# Patient Record
Sex: Female | Born: 1985
Health system: Southern US, Community
[De-identification: ages and names within clinical notes are randomized; demographics above are authoritative.]

## PROBLEM LIST (undated history)

## (undated) DIAGNOSIS — E119 Type 2 diabetes mellitus without complications: Secondary | ICD-10-CM

---
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
## 2004-03-28 ENCOUNTER — Ambulatory Visit (HOSPITAL_COMMUNITY): Admission: RE | Admit: 2004-03-28 | Discharge: 2004-03-28 | Payer: Self-pay | Admitting: *Deleted

## 2004-05-31 ENCOUNTER — Inpatient Hospital Stay (HOSPITAL_COMMUNITY): Admission: AD | Admit: 2004-05-31 | Discharge: 2004-05-31 | Payer: Self-pay | Admitting: *Deleted

## 2004-07-18 ENCOUNTER — Inpatient Hospital Stay (HOSPITAL_COMMUNITY): Admission: AD | Admit: 2004-07-18 | Discharge: 2004-07-18 | Payer: Self-pay | Admitting: *Deleted

## 2004-07-27 ENCOUNTER — Ambulatory Visit: Payer: Self-pay | Admitting: *Deleted

## 2004-08-01 ENCOUNTER — Encounter: Admission: RE | Admit: 2004-08-01 | Discharge: 2004-08-01 | Payer: Self-pay | Admitting: *Deleted

## 2004-08-03 ENCOUNTER — Ambulatory Visit: Payer: Self-pay | Admitting: *Deleted

## 2004-08-10 ENCOUNTER — Ambulatory Visit: Payer: Self-pay | Admitting: Obstetrics & Gynecology

## 2004-08-17 ENCOUNTER — Ambulatory Visit (HOSPITAL_COMMUNITY): Admission: RE | Admit: 2004-08-17 | Discharge: 2004-08-17 | Payer: Self-pay | Admitting: *Deleted

## 2004-08-17 ENCOUNTER — Ambulatory Visit: Payer: Self-pay | Admitting: Obstetrics & Gynecology

## 2004-08-25 ENCOUNTER — Ambulatory Visit: Payer: Self-pay | Admitting: Family Medicine

## 2004-09-01 ENCOUNTER — Ambulatory Visit: Payer: Self-pay | Admitting: Family Medicine

## 2004-09-05 ENCOUNTER — Ambulatory Visit: Payer: Self-pay | Admitting: Obstetrics & Gynecology

## 2004-09-07 ENCOUNTER — Ambulatory Visit: Payer: Self-pay | Admitting: Obstetrics & Gynecology

## 2004-09-12 ENCOUNTER — Ambulatory Visit: Payer: Self-pay | Admitting: Obstetrics and Gynecology

## 2004-09-14 ENCOUNTER — Ambulatory Visit (HOSPITAL_COMMUNITY): Admission: RE | Admit: 2004-09-14 | Discharge: 2004-09-14 | Payer: Self-pay | Admitting: *Deleted

## 2004-09-15 ENCOUNTER — Ambulatory Visit: Payer: Self-pay | Admitting: Family Medicine

## 2004-09-19 ENCOUNTER — Ambulatory Visit: Payer: Self-pay | Admitting: Obstetrics and Gynecology

## 2004-09-22 ENCOUNTER — Ambulatory Visit: Payer: Self-pay | Admitting: Family Medicine

## 2004-09-26 ENCOUNTER — Ambulatory Visit: Payer: Self-pay | Admitting: Obstetrics and Gynecology

## 2004-09-29 ENCOUNTER — Ambulatory Visit: Payer: Self-pay | Admitting: Family Medicine

## 2004-09-30 ENCOUNTER — Inpatient Hospital Stay (HOSPITAL_COMMUNITY): Admission: AD | Admit: 2004-09-30 | Discharge: 2004-09-30 | Payer: Self-pay | Admitting: Obstetrics & Gynecology

## 2004-10-03 ENCOUNTER — Inpatient Hospital Stay (HOSPITAL_COMMUNITY): Admission: AD | Admit: 2004-10-03 | Discharge: 2004-10-06 | Payer: Self-pay | Admitting: Obstetrics and Gynecology

## 2004-10-03 ENCOUNTER — Ambulatory Visit: Payer: Self-pay | Admitting: Obstetrics and Gynecology

## 2004-10-03 ENCOUNTER — Encounter (INDEPENDENT_AMBULATORY_CARE_PROVIDER_SITE_OTHER): Payer: Self-pay | Admitting: *Deleted

## 2004-10-07 ENCOUNTER — Inpatient Hospital Stay (HOSPITAL_COMMUNITY): Admission: AD | Admit: 2004-10-07 | Discharge: 2004-10-07 | Payer: Self-pay | Admitting: *Deleted

## 2004-10-28 ENCOUNTER — Ambulatory Visit: Payer: Self-pay | Admitting: Family Medicine

## 2004-11-14 ENCOUNTER — Ambulatory Visit: Payer: Self-pay | Admitting: Sports Medicine

## 2006-01-14 ENCOUNTER — Encounter (INDEPENDENT_AMBULATORY_CARE_PROVIDER_SITE_OTHER): Payer: Self-pay | Admitting: *Deleted

## 2006-01-23 ENCOUNTER — Encounter (INDEPENDENT_AMBULATORY_CARE_PROVIDER_SITE_OTHER): Payer: Self-pay | Admitting: Specialist

## 2006-01-23 ENCOUNTER — Ambulatory Visit: Payer: Self-pay | Admitting: Sports Medicine

## 2006-01-25 ENCOUNTER — Ambulatory Visit: Payer: Self-pay | Admitting: Family Medicine

## 2006-12-13 DIAGNOSIS — E669 Obesity, unspecified: Secondary | ICD-10-CM | POA: Insufficient documentation

## 2006-12-13 DIAGNOSIS — E739 Lactose intolerance, unspecified: Secondary | ICD-10-CM

## 2006-12-14 ENCOUNTER — Encounter (INDEPENDENT_AMBULATORY_CARE_PROVIDER_SITE_OTHER): Payer: Self-pay | Admitting: *Deleted

## 2008-06-03 ENCOUNTER — Ambulatory Visit: Payer: Self-pay | Admitting: Family Medicine

## 2008-06-03 ENCOUNTER — Inpatient Hospital Stay (HOSPITAL_COMMUNITY): Admission: RE | Admit: 2008-06-03 | Discharge: 2008-06-04 | Payer: Self-pay | Admitting: Family Medicine

## 2008-06-03 ENCOUNTER — Ambulatory Visit: Payer: Self-pay | Admitting: Obstetrics & Gynecology

## 2008-06-15 ENCOUNTER — Ambulatory Visit: Payer: Self-pay | Admitting: Obstetrics & Gynecology

## 2008-06-15 ENCOUNTER — Inpatient Hospital Stay (HOSPITAL_COMMUNITY): Admission: AD | Admit: 2008-06-15 | Discharge: 2008-06-17 | Payer: Self-pay | Admitting: Obstetrics & Gynecology

## 2008-06-15 ENCOUNTER — Ambulatory Visit: Payer: Self-pay | Admitting: Family Medicine

## 2008-06-25 ENCOUNTER — Ambulatory Visit: Payer: Self-pay | Admitting: Obstetrics & Gynecology

## 2008-07-06 ENCOUNTER — Ambulatory Visit: Payer: Self-pay | Admitting: Obstetrics & Gynecology

## 2008-07-07 ENCOUNTER — Ambulatory Visit (HOSPITAL_COMMUNITY): Admission: RE | Admit: 2008-07-07 | Discharge: 2008-07-07 | Payer: Self-pay | Admitting: Obstetrics & Gynecology

## 2008-07-18 ENCOUNTER — Emergency Department (HOSPITAL_COMMUNITY): Admission: EM | Admit: 2008-07-18 | Discharge: 2008-07-18 | Payer: Self-pay | Admitting: Emergency Medicine

## 2008-07-20 ENCOUNTER — Ambulatory Visit: Payer: Self-pay | Admitting: Obstetrics & Gynecology

## 2008-08-03 ENCOUNTER — Encounter: Payer: Self-pay | Admitting: Family Medicine

## 2008-08-03 ENCOUNTER — Ambulatory Visit: Payer: Self-pay | Admitting: Family Medicine

## 2008-08-04 ENCOUNTER — Encounter: Payer: Self-pay | Admitting: Obstetrics and Gynecology

## 2008-08-04 ENCOUNTER — Ambulatory Visit (HOSPITAL_COMMUNITY): Admission: RE | Admit: 2008-08-04 | Discharge: 2008-08-04 | Payer: Self-pay | Admitting: Obstetrics & Gynecology

## 2008-08-04 LAB — CONVERTED CEMR LAB
Basophils Absolute: 0 10*3/uL (ref 0.0–0.1)
Basophils Relative: 0 % (ref 0–1)
Eosinophils Absolute: 0 10*3/uL (ref 0.0–0.7)
Hemoglobin: 12.2 g/dL (ref 12.0–15.0)
MCHC: 33 g/dL (ref 30.0–36.0)
MCV: 85.8 fL (ref 78.0–100.0)
Monocytes Absolute: 0.5 10*3/uL (ref 0.1–1.0)
Monocytes Relative: 5 % (ref 3–12)
Neutrophils Relative %: 69 % (ref 43–77)
RBC: 4.31 M/uL (ref 3.87–5.11)
RDW: 14.7 % (ref 11.5–15.5)
Rh Type: POSITIVE
Rubella: 160.3 intl units/mL — ABNORMAL HIGH

## 2008-08-10 ENCOUNTER — Ambulatory Visit: Payer: Self-pay | Admitting: Obstetrics & Gynecology

## 2008-08-11 ENCOUNTER — Encounter: Payer: Self-pay | Admitting: Obstetrics and Gynecology

## 2008-08-17 ENCOUNTER — Ambulatory Visit: Payer: Self-pay | Admitting: Obstetrics & Gynecology

## 2008-08-24 ENCOUNTER — Ambulatory Visit: Payer: Self-pay | Admitting: Obstetrics & Gynecology

## 2008-08-25 ENCOUNTER — Ambulatory Visit (HOSPITAL_COMMUNITY): Admission: RE | Admit: 2008-08-25 | Discharge: 2008-08-25 | Payer: Self-pay | Admitting: Obstetrics & Gynecology

## 2008-08-31 ENCOUNTER — Encounter: Payer: Self-pay | Admitting: Obstetrics and Gynecology

## 2008-08-31 ENCOUNTER — Ambulatory Visit: Payer: Self-pay | Admitting: Obstetrics & Gynecology

## 2008-08-31 ENCOUNTER — Encounter: Admission: RE | Admit: 2008-08-31 | Discharge: 2008-11-29 | Payer: Self-pay | Admitting: Obstetrics & Gynecology

## 2008-09-07 ENCOUNTER — Ambulatory Visit: Payer: Self-pay | Admitting: Obstetrics & Gynecology

## 2008-09-07 ENCOUNTER — Encounter: Payer: Self-pay | Admitting: Obstetrics and Gynecology

## 2008-09-14 ENCOUNTER — Ambulatory Visit: Payer: Self-pay | Admitting: Family Medicine

## 2008-09-21 ENCOUNTER — Ambulatory Visit: Payer: Self-pay | Admitting: Obstetrics & Gynecology

## 2008-09-21 ENCOUNTER — Encounter: Payer: Self-pay | Admitting: Obstetrics and Gynecology

## 2008-09-21 LAB — CONVERTED CEMR LAB: GC Probe Amp, Genital: NEGATIVE

## 2008-09-22 ENCOUNTER — Encounter: Payer: Self-pay | Admitting: Obstetrics and Gynecology

## 2008-09-22 LAB — CONVERTED CEMR LAB
Clue Cells Wet Prep HPF POC: NONE SEEN
Trich, Wet Prep: NONE SEEN

## 2008-09-28 ENCOUNTER — Ambulatory Visit: Payer: Self-pay | Admitting: Family Medicine

## 2008-09-29 ENCOUNTER — Ambulatory Visit (HOSPITAL_COMMUNITY): Admission: RE | Admit: 2008-09-29 | Discharge: 2008-09-29 | Payer: Self-pay | Admitting: Obstetrics & Gynecology

## 2008-10-05 ENCOUNTER — Ambulatory Visit: Payer: Self-pay | Admitting: Obstetrics & Gynecology

## 2008-10-12 ENCOUNTER — Ambulatory Visit: Payer: Self-pay | Admitting: Family Medicine

## 2008-10-19 ENCOUNTER — Ambulatory Visit: Payer: Self-pay | Admitting: Obstetrics & Gynecology

## 2008-10-22 ENCOUNTER — Ambulatory Visit: Payer: Self-pay | Admitting: Family Medicine

## 2008-10-26 ENCOUNTER — Ambulatory Visit (HOSPITAL_COMMUNITY): Admission: RE | Admit: 2008-10-26 | Discharge: 2008-10-26 | Payer: Self-pay | Admitting: Obstetrics & Gynecology

## 2008-10-26 ENCOUNTER — Ambulatory Visit: Payer: Self-pay | Admitting: Family Medicine

## 2008-11-05 ENCOUNTER — Ambulatory Visit (HOSPITAL_COMMUNITY): Admission: RE | Admit: 2008-11-05 | Discharge: 2008-11-05 | Payer: Self-pay | Admitting: Family Medicine

## 2008-11-09 ENCOUNTER — Encounter: Payer: Self-pay | Admitting: Obstetrics and Gynecology

## 2008-11-09 ENCOUNTER — Ambulatory Visit: Payer: Self-pay | Admitting: Obstetrics & Gynecology

## 2008-11-09 LAB — CONVERTED CEMR LAB
HCT: 34.9 % — ABNORMAL LOW (ref 36.0–46.0)
Hemoglobin: 11.5 g/dL — ABNORMAL LOW (ref 12.0–15.0)
RBC: 4.15 M/uL (ref 3.87–5.11)
WBC: 9.2 10*3/uL (ref 4.0–10.5)

## 2008-11-12 ENCOUNTER — Ambulatory Visit (HOSPITAL_COMMUNITY): Admission: RE | Admit: 2008-11-12 | Discharge: 2008-11-12 | Payer: Self-pay | Admitting: Family Medicine

## 2008-11-16 ENCOUNTER — Ambulatory Visit: Payer: Self-pay | Admitting: Family Medicine

## 2008-11-20 ENCOUNTER — Ambulatory Visit (HOSPITAL_COMMUNITY): Admission: RE | Admit: 2008-11-20 | Discharge: 2008-11-20 | Payer: Self-pay | Admitting: Family Medicine

## 2008-11-23 ENCOUNTER — Ambulatory Visit: Payer: Self-pay | Admitting: Obstetrics & Gynecology

## 2008-11-27 ENCOUNTER — Ambulatory Visit (HOSPITAL_COMMUNITY): Admission: RE | Admit: 2008-11-27 | Discharge: 2008-11-27 | Payer: Self-pay | Admitting: Family Medicine

## 2008-11-30 ENCOUNTER — Ambulatory Visit: Payer: Self-pay | Admitting: Obstetrics & Gynecology

## 2008-12-03 ENCOUNTER — Encounter: Payer: Self-pay | Admitting: Obstetrics and Gynecology

## 2008-12-03 ENCOUNTER — Ambulatory Visit: Payer: Self-pay | Admitting: Obstetrics & Gynecology

## 2008-12-03 LAB — CONVERTED CEMR LAB
Trich, Wet Prep: NONE SEEN
Yeast Wet Prep HPF POC: NONE SEEN

## 2008-12-07 ENCOUNTER — Ambulatory Visit: Payer: Self-pay | Admitting: Family Medicine

## 2008-12-08 ENCOUNTER — Encounter: Payer: Self-pay | Admitting: Obstetrics and Gynecology

## 2008-12-10 ENCOUNTER — Ambulatory Visit (HOSPITAL_COMMUNITY): Admission: RE | Admit: 2008-12-10 | Discharge: 2008-12-10 | Payer: Self-pay | Admitting: Family Medicine

## 2008-12-14 ENCOUNTER — Encounter: Admission: RE | Admit: 2008-12-14 | Discharge: 2009-01-11 | Payer: Self-pay | Admitting: Obstetrics & Gynecology

## 2008-12-14 ENCOUNTER — Ambulatory Visit: Payer: Self-pay | Admitting: Obstetrics & Gynecology

## 2008-12-17 ENCOUNTER — Ambulatory Visit (HOSPITAL_COMMUNITY): Admission: RE | Admit: 2008-12-17 | Discharge: 2008-12-17 | Payer: Self-pay | Admitting: Family Medicine

## 2008-12-21 ENCOUNTER — Ambulatory Visit: Payer: Self-pay | Admitting: Obstetrics & Gynecology

## 2008-12-24 ENCOUNTER — Ambulatory Visit (HOSPITAL_COMMUNITY): Admission: RE | Admit: 2008-12-24 | Discharge: 2008-12-24 | Payer: Self-pay | Admitting: Family Medicine

## 2008-12-24 ENCOUNTER — Ambulatory Visit: Payer: Self-pay | Admitting: Family Medicine

## 2008-12-28 ENCOUNTER — Encounter: Payer: Self-pay | Admitting: Obstetrics and Gynecology

## 2008-12-28 ENCOUNTER — Ambulatory Visit: Payer: Self-pay | Admitting: Obstetrics & Gynecology

## 2008-12-28 LAB — CONVERTED CEMR LAB
Chlamydia, DNA Probe: NEGATIVE
GC Probe Amp, Genital: NEGATIVE

## 2008-12-31 ENCOUNTER — Ambulatory Visit: Payer: Self-pay | Admitting: Family Medicine

## 2009-01-04 ENCOUNTER — Ambulatory Visit: Payer: Self-pay | Admitting: Obstetrics & Gynecology

## 2009-01-07 ENCOUNTER — Ambulatory Visit: Payer: Self-pay | Admitting: Obstetrics & Gynecology

## 2009-01-11 ENCOUNTER — Ambulatory Visit: Payer: Self-pay | Admitting: Obstetrics & Gynecology

## 2009-01-12 ENCOUNTER — Inpatient Hospital Stay (HOSPITAL_COMMUNITY): Admission: AD | Admit: 2009-01-12 | Discharge: 2009-01-14 | Payer: Self-pay | Admitting: Obstetrics & Gynecology

## 2009-01-12 ENCOUNTER — Ambulatory Visit: Payer: Self-pay | Admitting: Family Medicine

## 2009-04-03 IMAGING — US US OB LIMITED
1 series · 18 of 19 positions shown · non-contrast
Comparison: none

Addendum Begins11/06/2008 - DUPLICATE COPY for exam association in [facility] ? No change from original report.

 Addendum Ends
 OBSTETRICAL ULTRASOUND:
 This ultrasound was performed in The [HOSPITAL], and the [facility] report will be stored to [REDACTED] PACS.

[Series 1: us ob limited · 19 acquisitions, 18 frames shown]
[im 1/19]
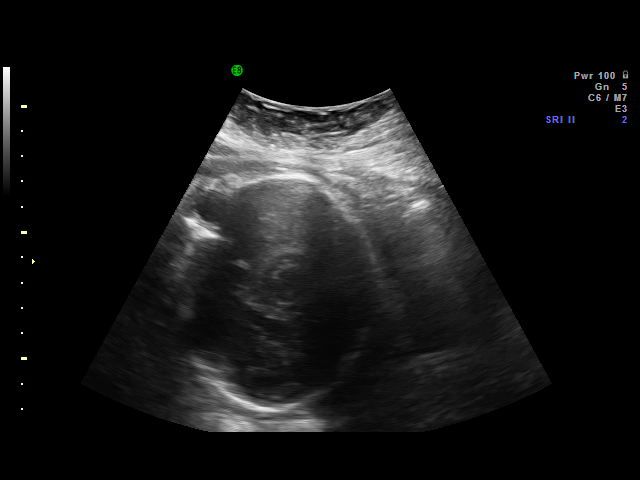
[im 2/19]
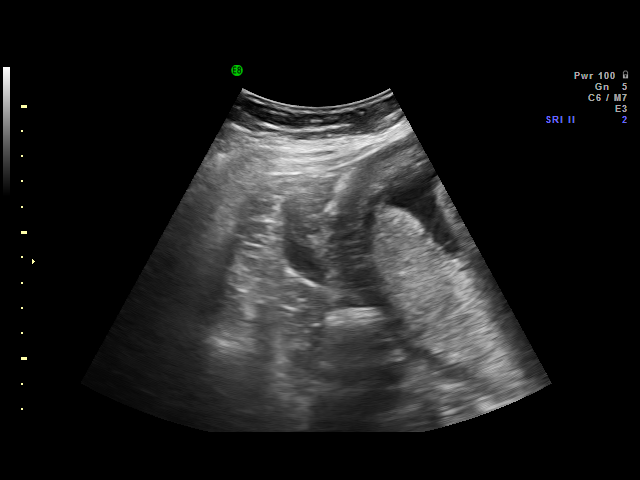
[im 3/19]
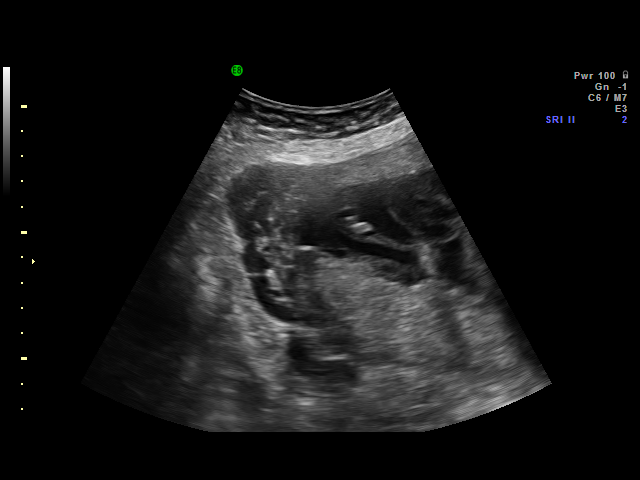
[im 4/19]
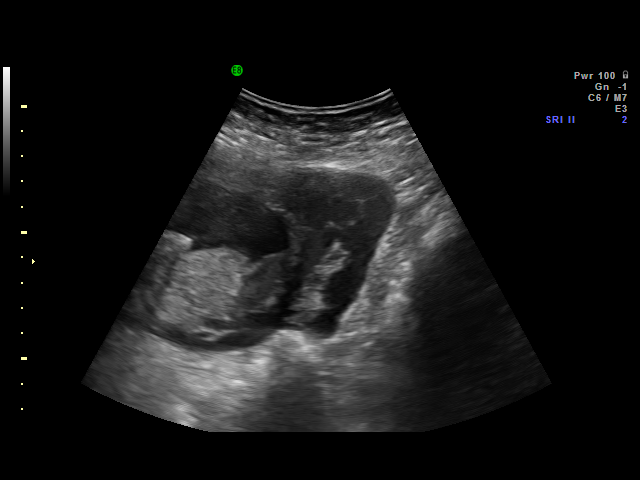
[im 5/19]
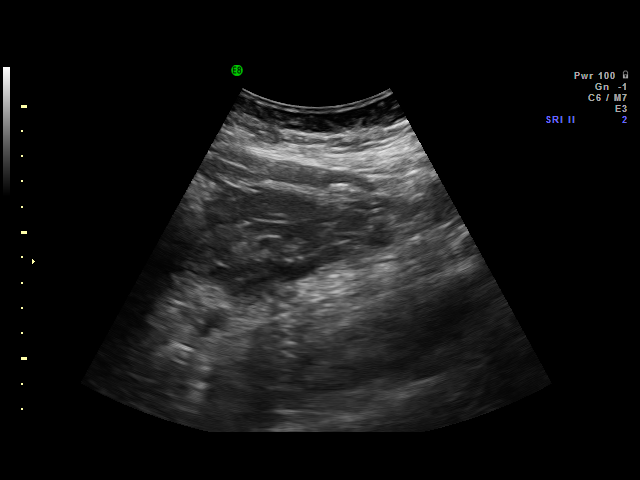
[im 6/19]
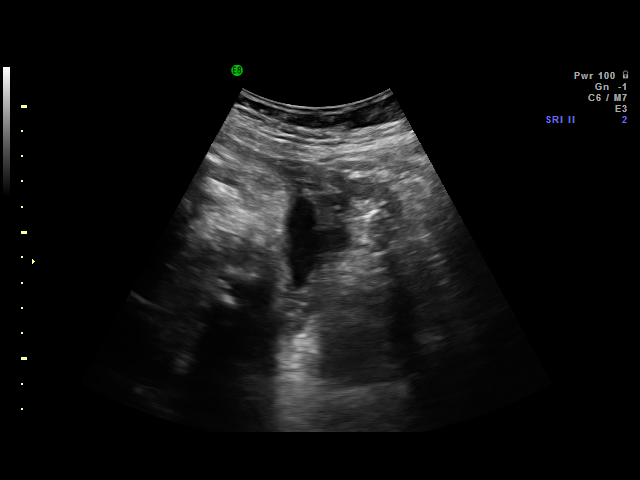
[im 7/19]
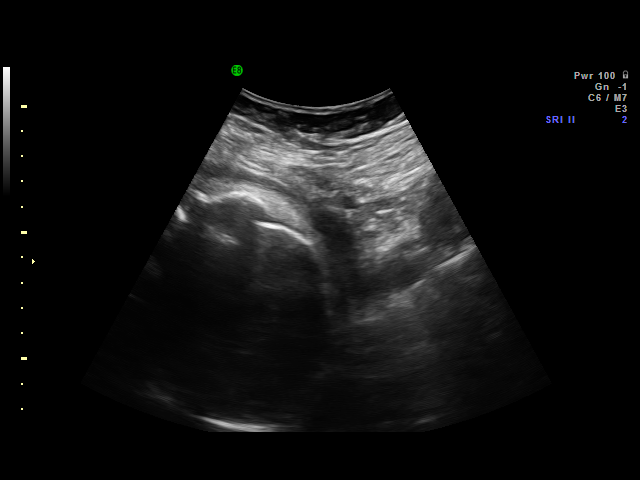
[im 8/19]
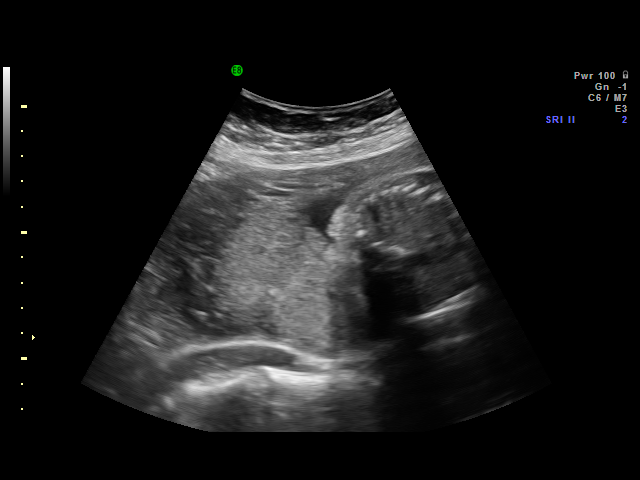
[im 9/19]
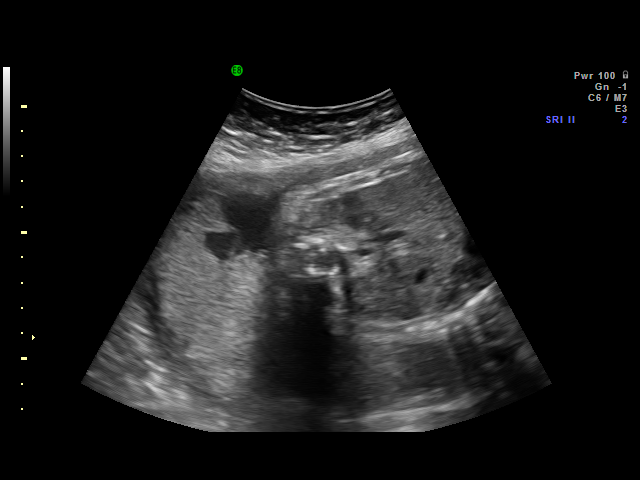
[im 11/19]
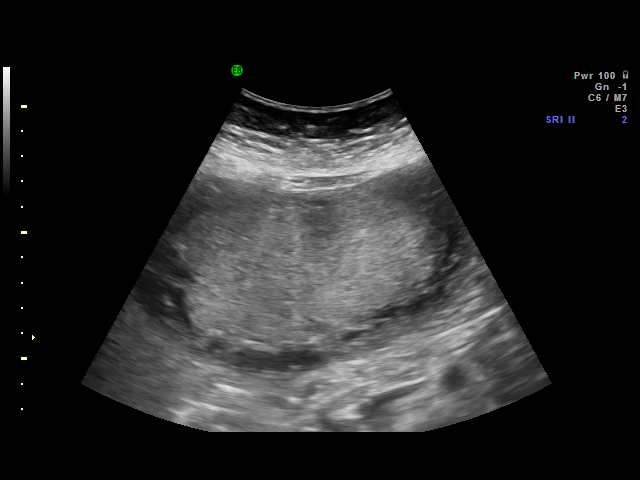
[im 12/19]
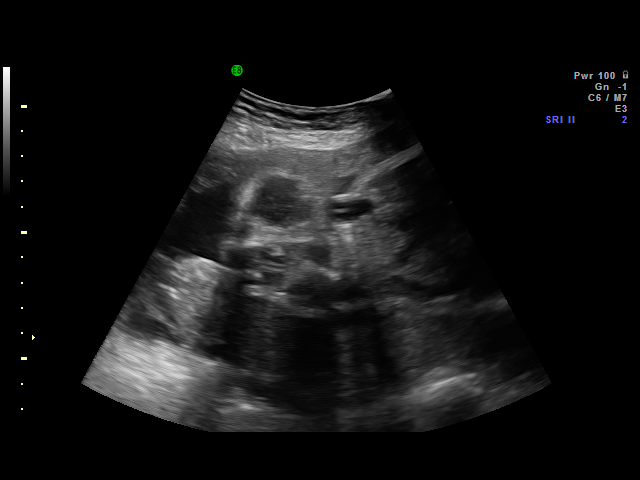
[im 13/19]
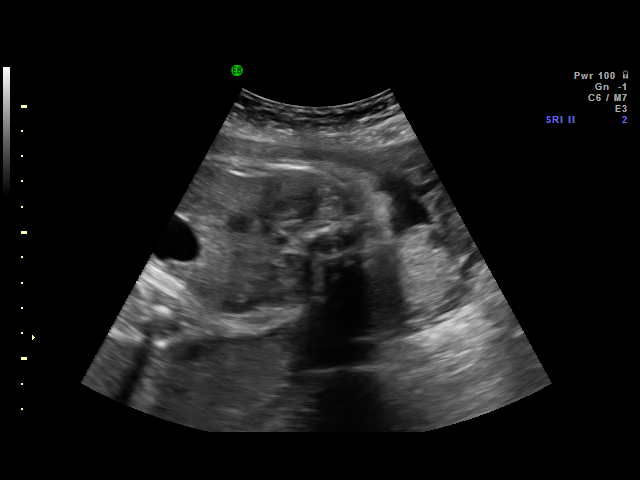
[im 14/19]
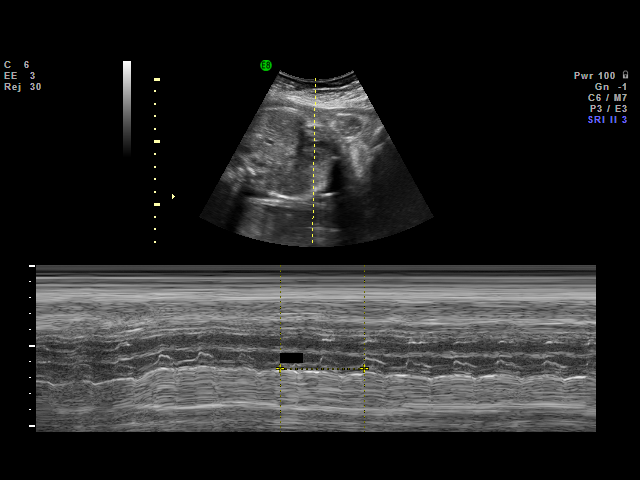
[im 15/19]
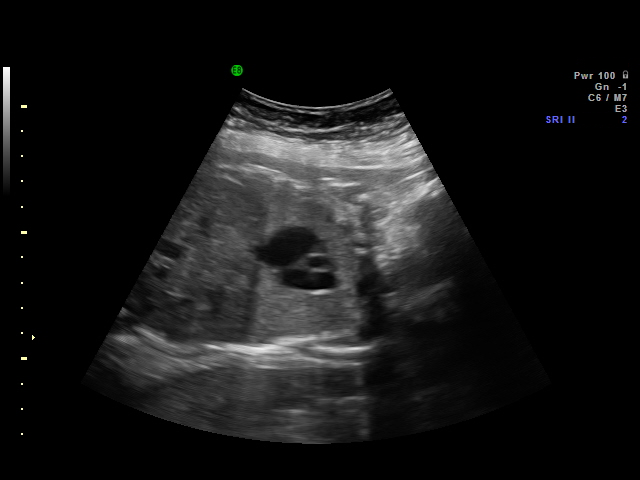
[im 16/19]
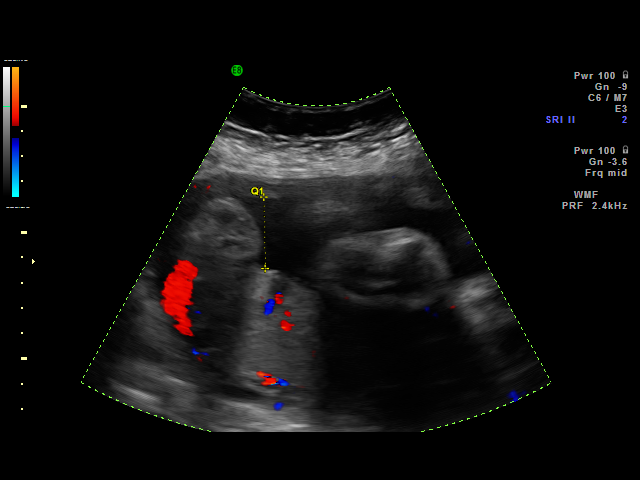
[im 17/19]
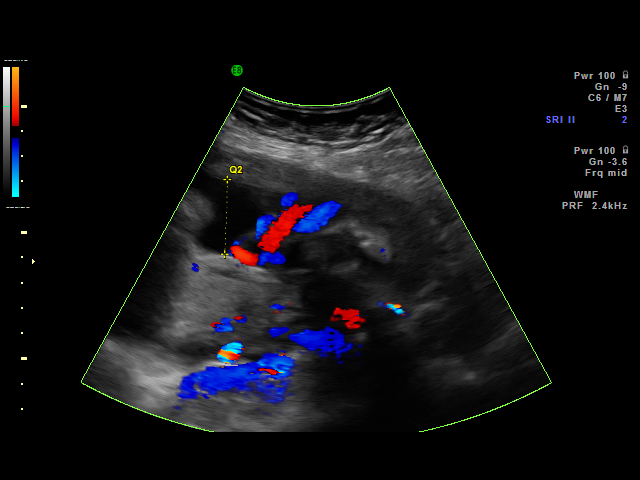
[im 18/19]
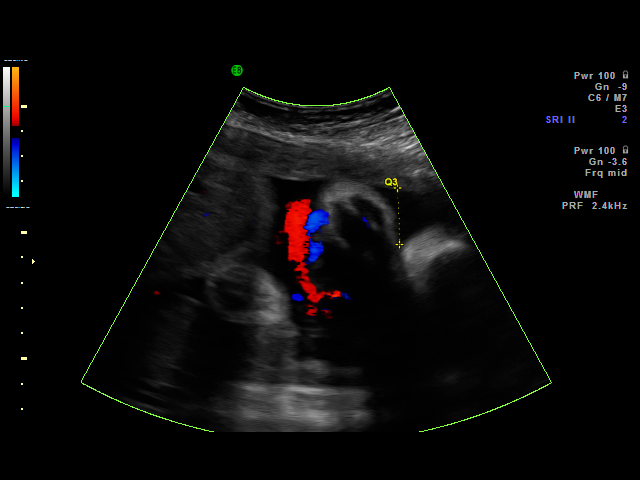
[im 19/19]
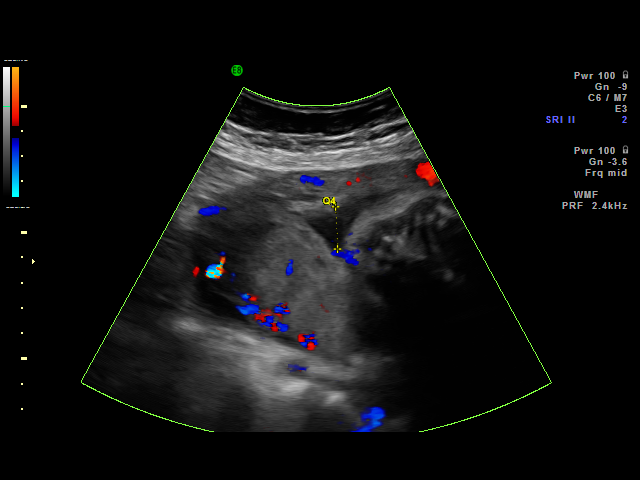

[18 of 19 positions shown; findings below may reference images not displayed]

IMPRESSION: [facility] has also been faxed to the ordering physician.

## 2009-04-25 IMAGING — US US OB LIMITED
1 series · 15 of 15 positions shown · non-contrast
Comparison: none

OBSTETRICAL ULTRASOUND:
 This ultrasound was performed in The [HOSPITAL], and the [facility] report will be stored to [REDACTED] PACS.

[Series 1: us ob limited · 15 acquisitions, 15 frames shown]
[im 1/15]
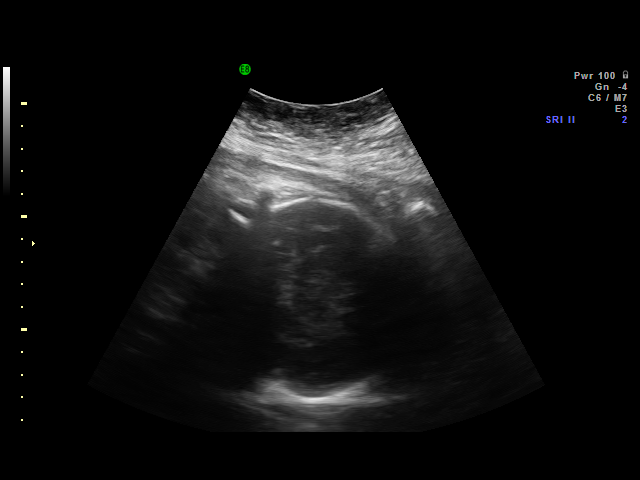
[im 2/15]
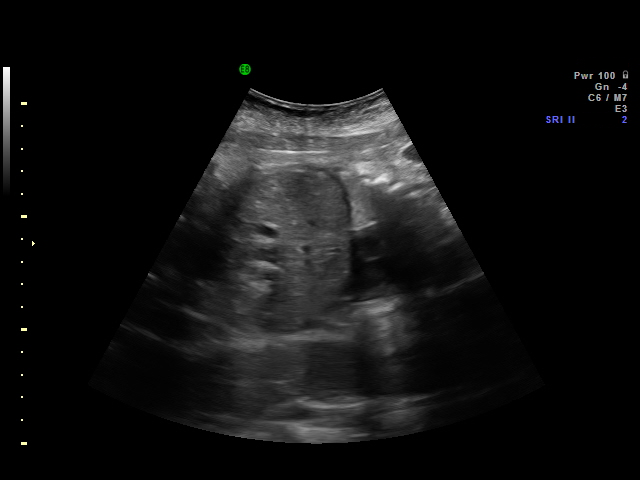
[im 3/15]
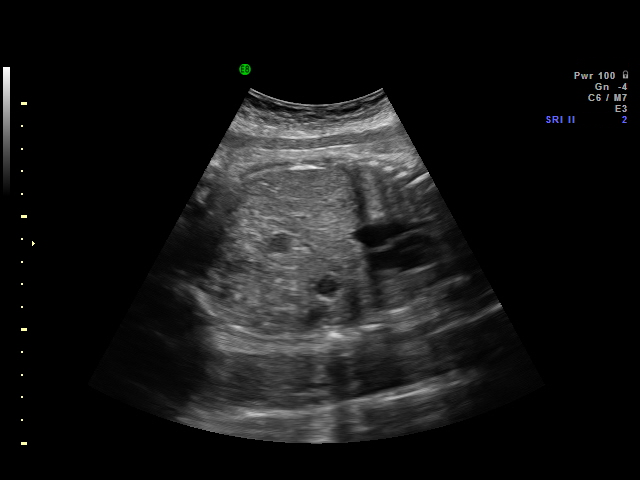
[im 4/15]
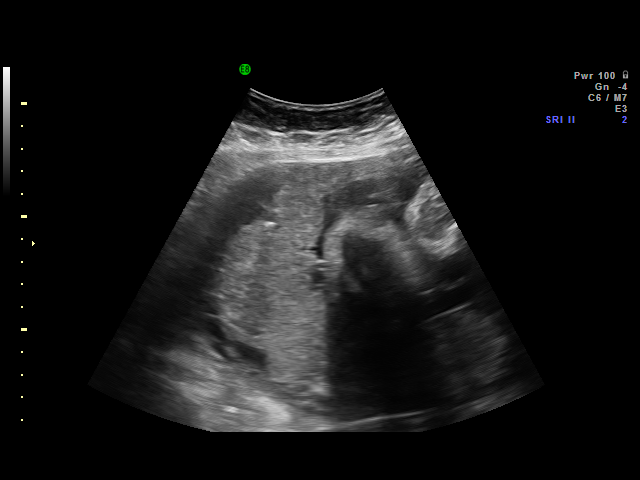
[im 5/15]
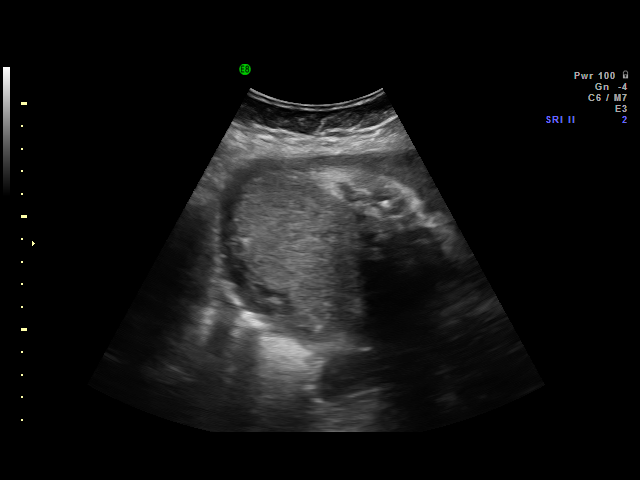
[im 6/15]
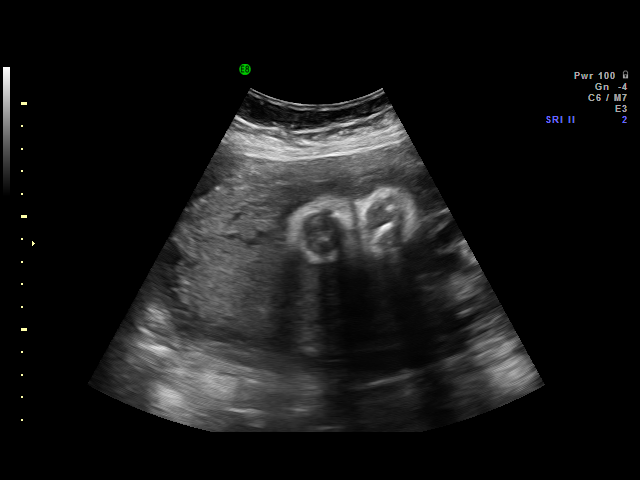
[im 7/15]
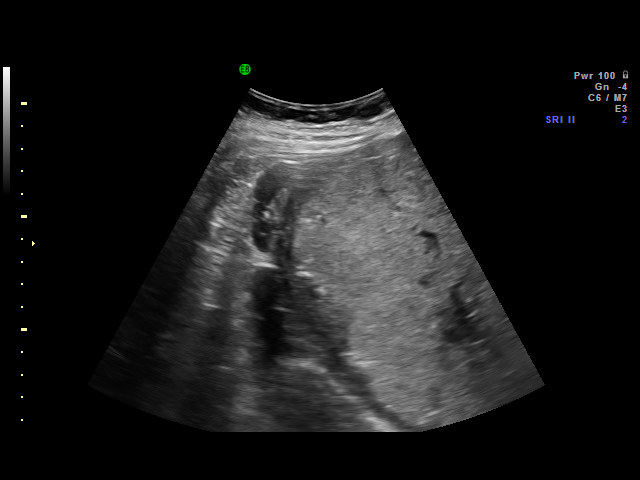
[im 8/15]
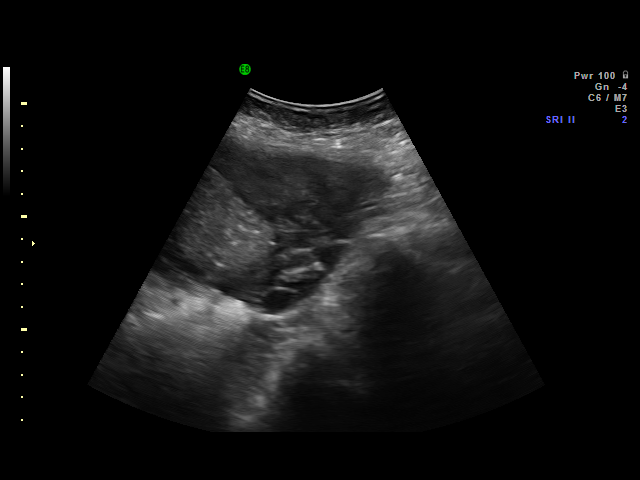
[im 9/15]
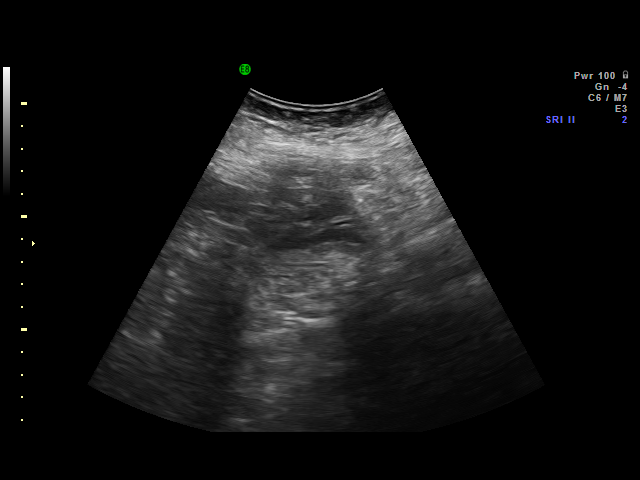
[im 10/15]
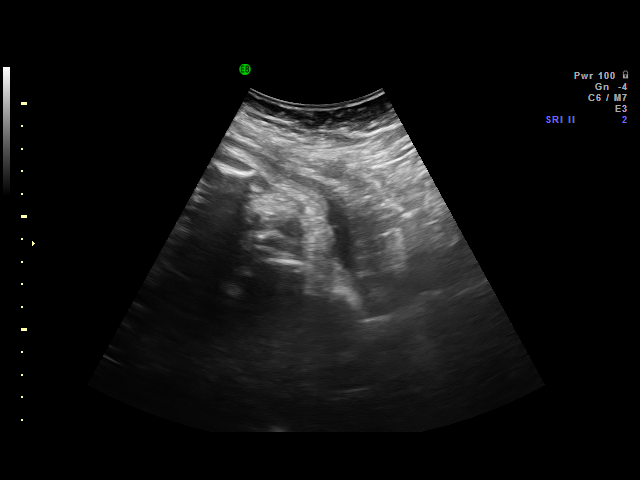
[im 11/15]
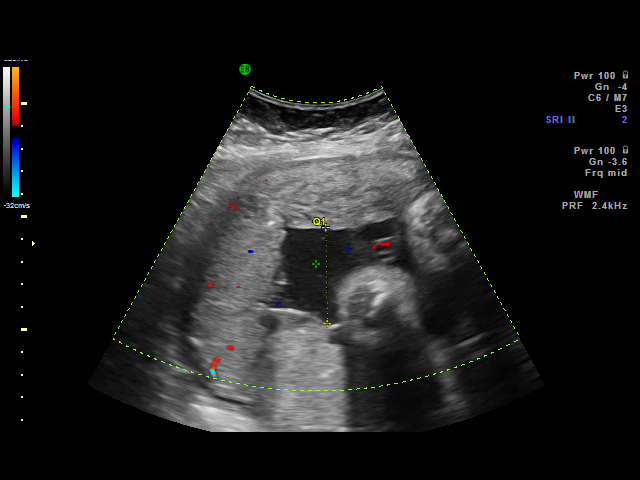
[im 12/15]
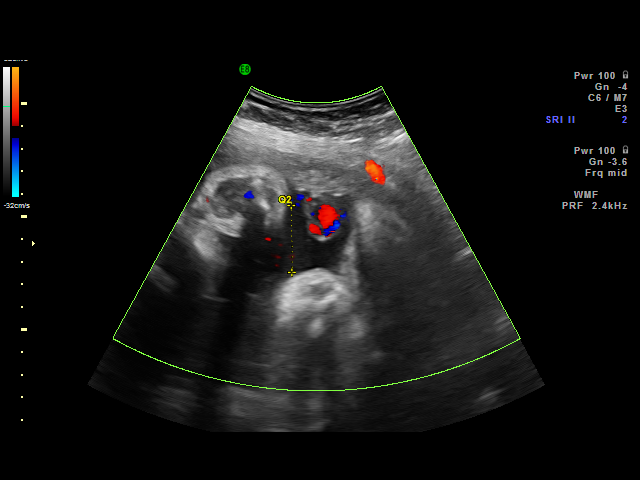
[im 13/15]
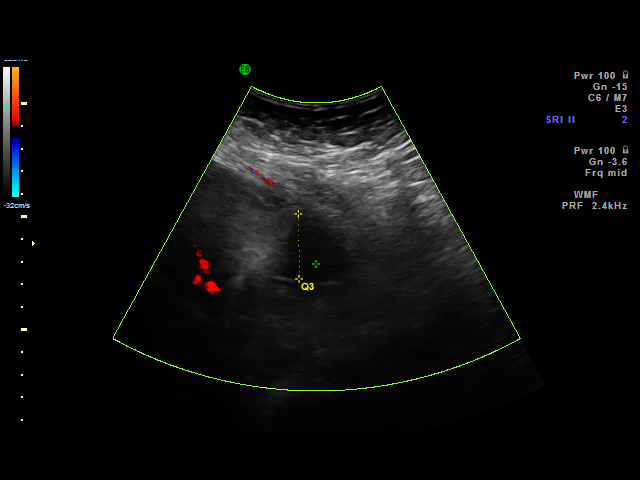
[im 14/15]
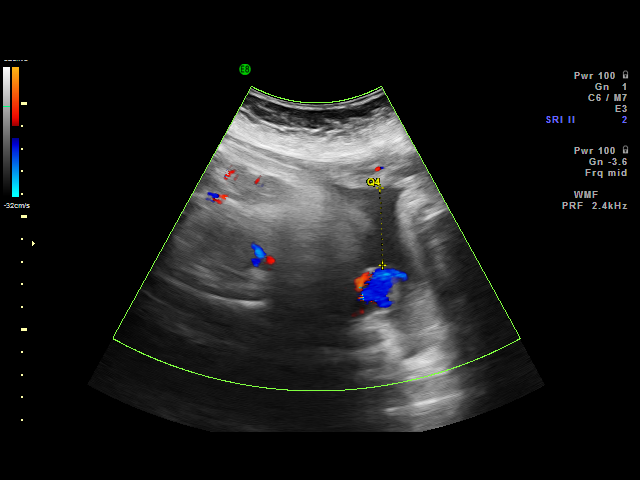
[im 15/15]
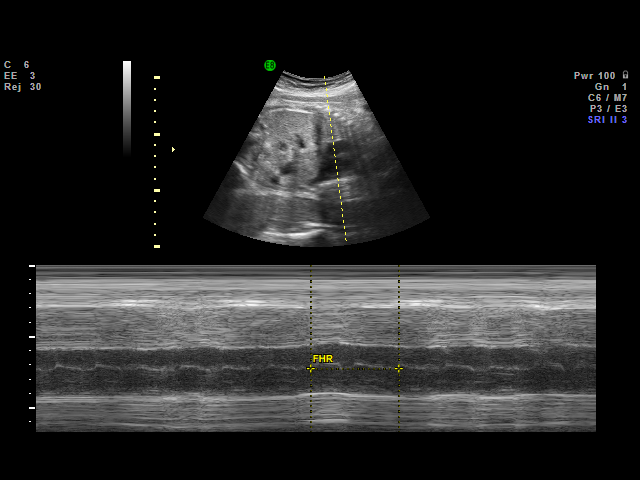

[15 of 15 positions shown; findings below may reference images not displayed]

IMPRESSION: [facility] has also been faxed to the ordering physician.

## 2009-05-15 IMAGING — US US OB LIMITED
1 series · 14 of 19 positions shown · non-contrast
Comparison: none

OBSTETRICAL ULTRASOUND:
 This ultrasound was performed in The [HOSPITAL], and the [facility] report will be stored to [REDACTED] PACS.

[Series 1: us ob limited · 19 acquisitions, 14 frames shown]
[im 1/19]
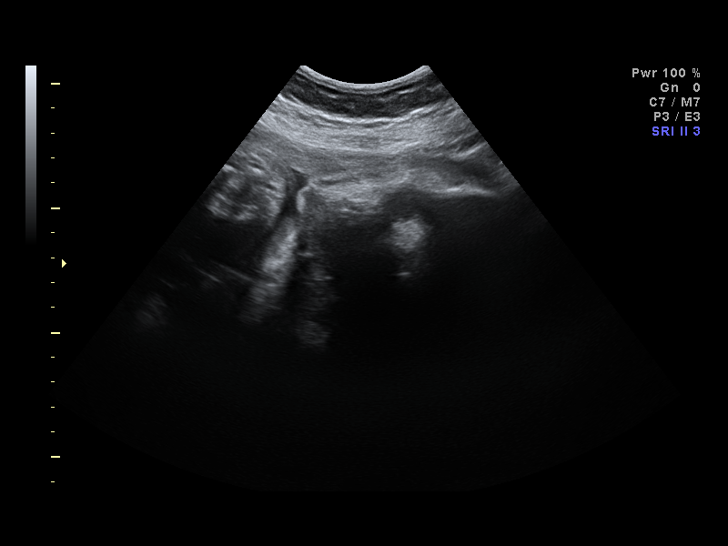
[im 3/19]
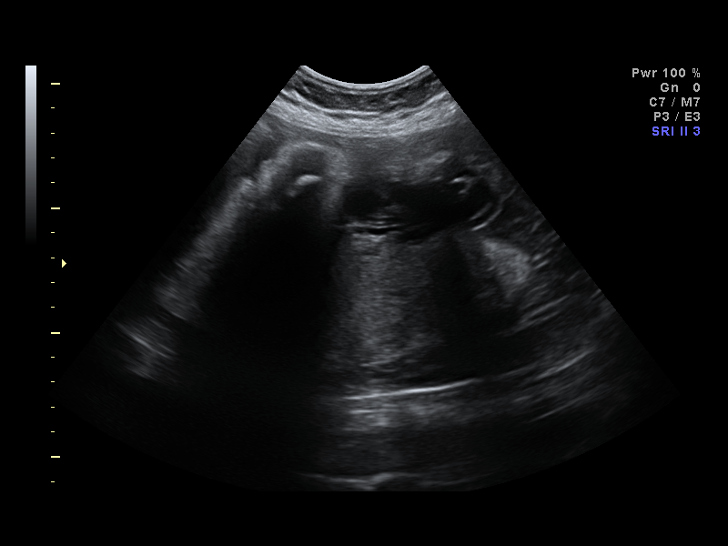
[im 4/19]
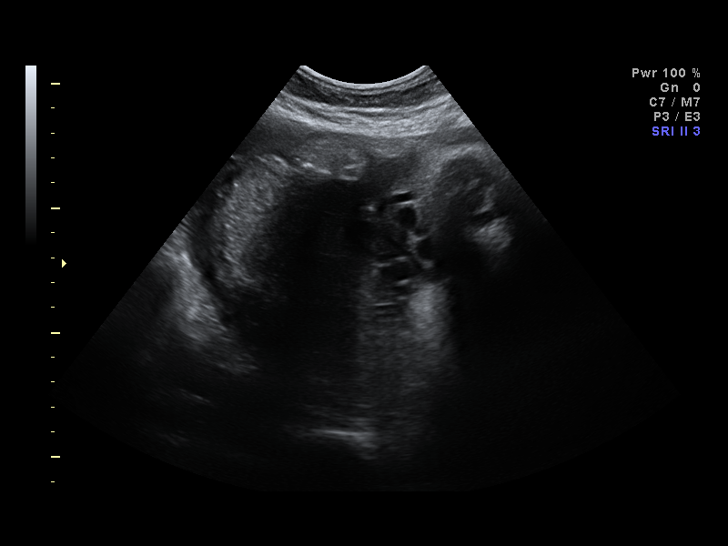
[im 5/19]
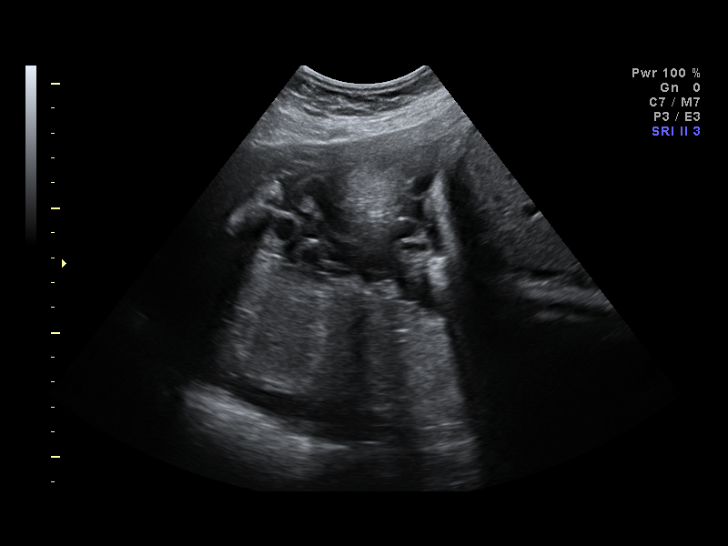
[im 7/19]
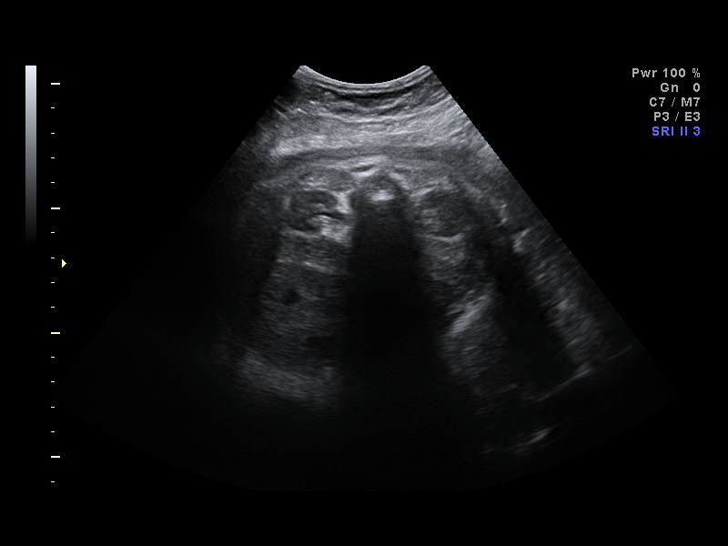
[im 8/19]
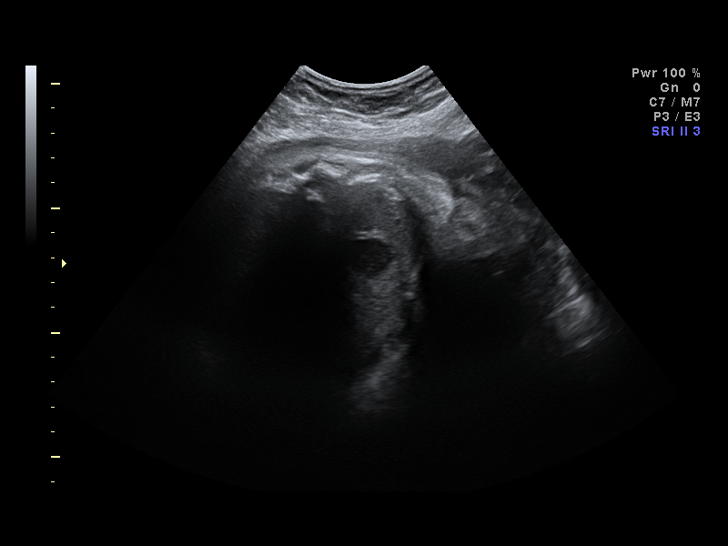
[im 9/19]
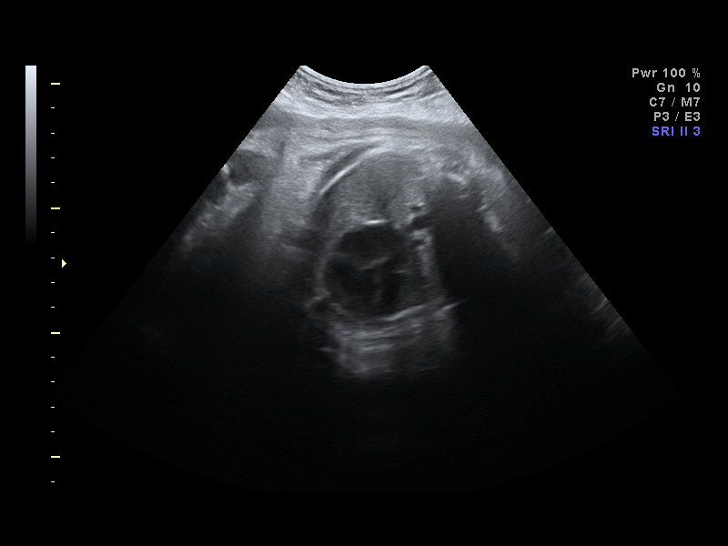
[im 11/19]
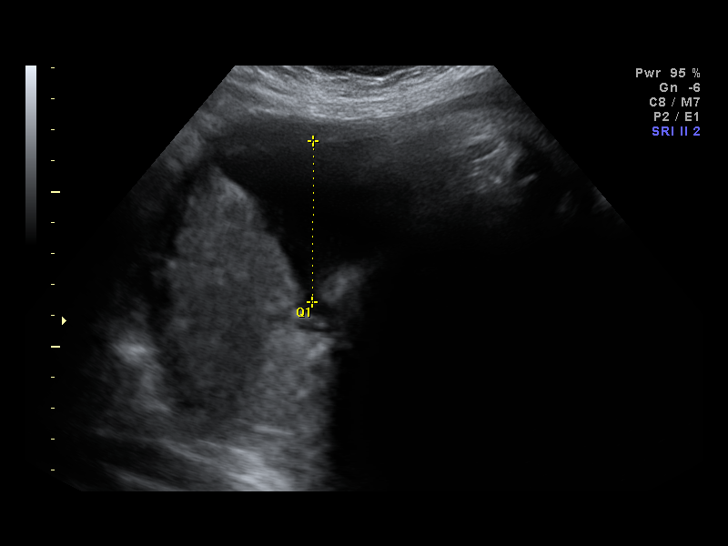
[im 12/19]
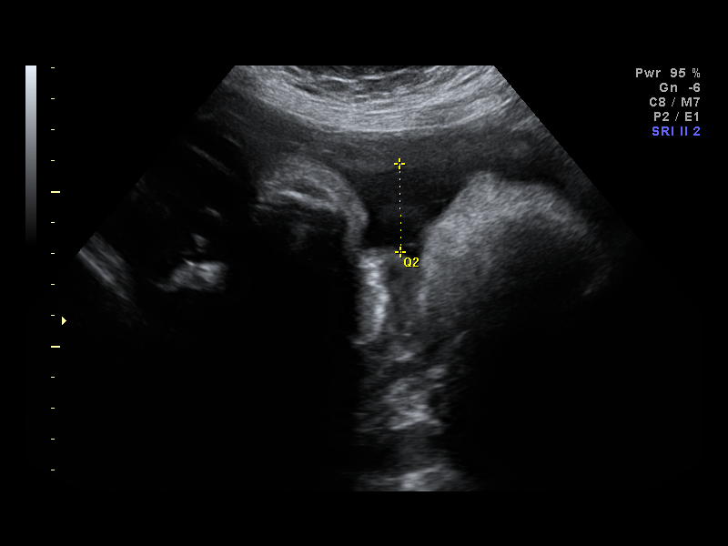
[im 13/19]
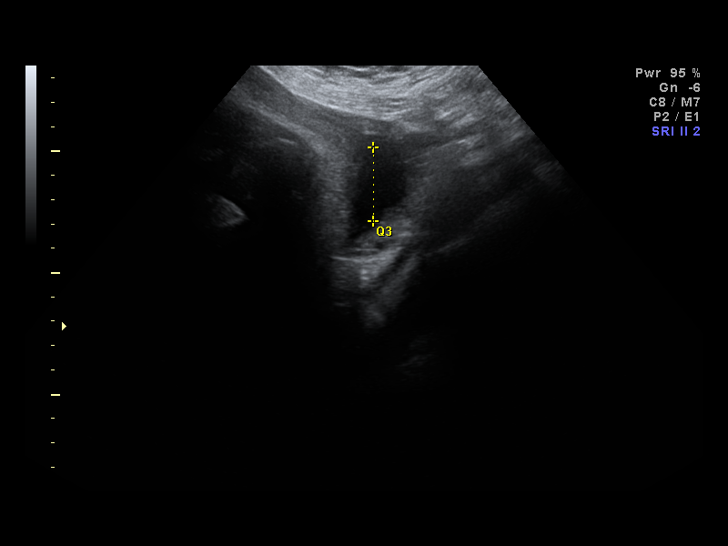
[im 15/19]
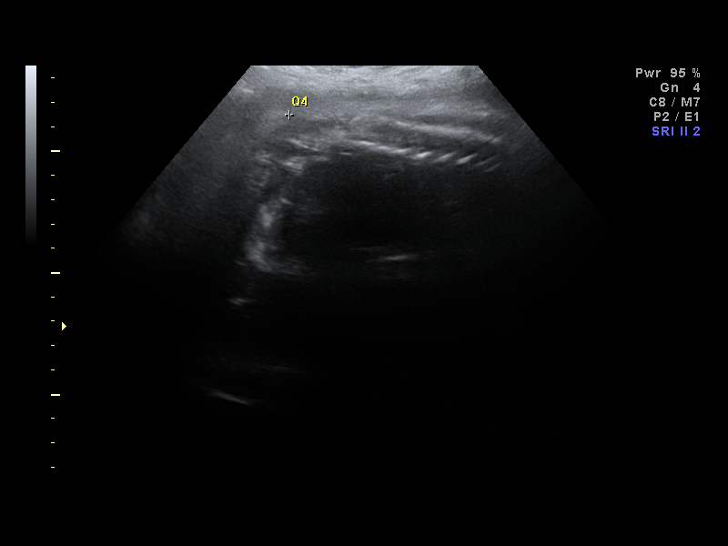
[im 16/19]
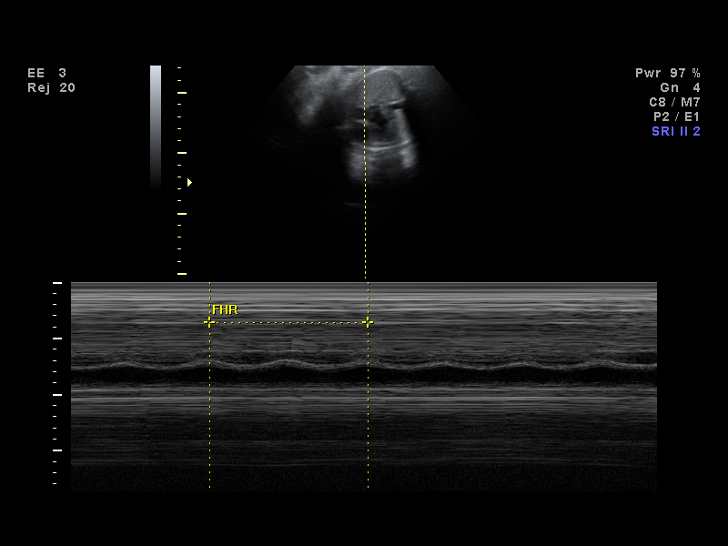
[im 17/19]
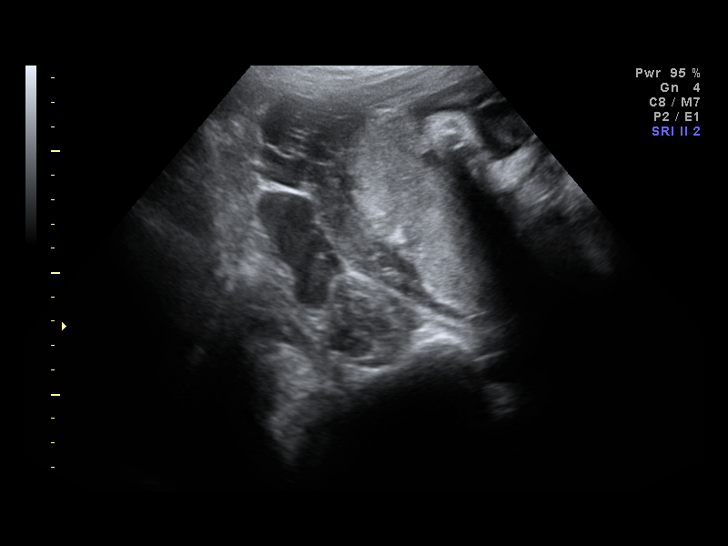
[im 19/19]
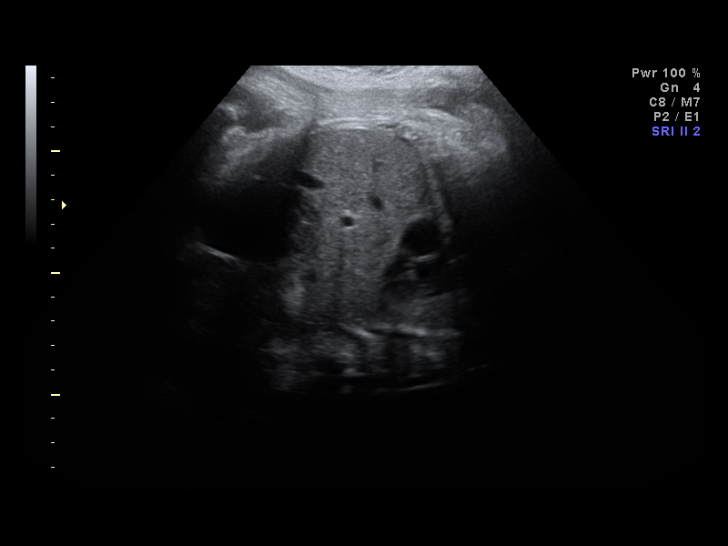

[14 of 19 positions shown; findings below may reference images not displayed]

IMPRESSION: [facility] has also been faxed to the ordering physician.

## 2010-10-18 ENCOUNTER — Encounter (INDEPENDENT_AMBULATORY_CARE_PROVIDER_SITE_OTHER): Payer: Self-pay | Admitting: Family Medicine

## 2010-10-18 LAB — CONVERTED CEMR LAB
ALT: 125 units/L — ABNORMAL HIGH (ref 0–35)
AST: 97 units/L — ABNORMAL HIGH (ref 0–37)
BUN: 10 mg/dL (ref 6–23)
Cholesterol: 240 mg/dL — ABNORMAL HIGH (ref 0–200)
Creatinine, Ser: 0.43 mg/dL (ref 0.40–1.20)
HDL: 52 mg/dL (ref >39)
Total Bilirubin: 0.6 mg/dL (ref 0.3–1.2)
Total CHOL/HDL Ratio: 4.6
VLDL: 28 mg/dL (ref 0–40)

## 2010-11-06 ENCOUNTER — Encounter: Payer: Self-pay | Admitting: *Deleted

## 2011-01-24 ENCOUNTER — Other Ambulatory Visit: Payer: Self-pay | Admitting: Family Medicine

## 2011-01-25 LAB — GLUCOSE, CAPILLARY: Glucose-Capillary: 102 mg/dL — ABNORMAL HIGH (ref 70–99)

## 2011-01-26 LAB — POCT URINALYSIS DIP (DEVICE)
Bilirubin Urine: NEGATIVE
Bilirubin Urine: NEGATIVE
Glucose, UA: NEGATIVE mg/dL
Glucose, UA: NEGATIVE mg/dL
Glucose, UA: NEGATIVE mg/dL
Ketones, ur: NEGATIVE mg/dL
Ketones, ur: NEGATIVE mg/dL
Nitrite: NEGATIVE
Nitrite: NEGATIVE
Protein, ur: NEGATIVE mg/dL
Specific Gravity, Urine: 1.02 (ref 1.005–1.030)
Urobilinogen, UA: 0.2 mg/dL (ref 0.0–1.0)
pH: 6 (ref 5.0–8.0)

## 2011-01-26 LAB — GLUCOSE, CAPILLARY
Glucose-Capillary: 109 mg/dL — ABNORMAL HIGH (ref 70–99)
Glucose-Capillary: 110 mg/dL — ABNORMAL HIGH (ref 70–99)
Glucose-Capillary: 115 mg/dL — ABNORMAL HIGH (ref 70–99)
Glucose-Capillary: 200 mg/dL — ABNORMAL HIGH (ref 70–99)
Glucose-Capillary: 212 mg/dL — ABNORMAL HIGH (ref 70–99)
Glucose-Capillary: 88 mg/dL (ref 70–99)

## 2011-01-26 LAB — CBC
HCT: 37.9 % (ref 36.0–46.0)
Hemoglobin: 12.8 g/dL (ref 12.0–15.0)
MCHC: 33.7 g/dL (ref 30.0–36.0)
Platelets: 140 10*3/uL — ABNORMAL LOW (ref 150–400)
RDW: 15.6 % — ABNORMAL HIGH (ref 11.5–15.5)

## 2011-01-30 LAB — POCT URINALYSIS DIP (DEVICE)
Bilirubin Urine: NEGATIVE
Bilirubin Urine: NEGATIVE
Bilirubin Urine: NEGATIVE
Glucose, UA: NEGATIVE mg/dL
Glucose, UA: NEGATIVE mg/dL
Glucose, UA: NEGATIVE mg/dL
Hgb urine dipstick: NEGATIVE
Hgb urine dipstick: NEGATIVE
Ketones, ur: NEGATIVE mg/dL
Ketones, ur: NEGATIVE mg/dL
Ketones, ur: NEGATIVE mg/dL
Nitrite: NEGATIVE
Protein, ur: NEGATIVE mg/dL
Specific Gravity, Urine: 1.015 (ref 1.005–1.030)
Specific Gravity, Urine: 1.02 (ref 1.005–1.030)
Specific Gravity, Urine: 1.02 (ref 1.005–1.030)
pH: 7.5 (ref 5.0–8.0)

## 2011-01-31 LAB — POCT URINALYSIS DIP (DEVICE)
Bilirubin Urine: NEGATIVE
Bilirubin Urine: NEGATIVE
Glucose, UA: NEGATIVE mg/dL
Glucose, UA: NEGATIVE mg/dL
Hgb urine dipstick: NEGATIVE
Hgb urine dipstick: NEGATIVE
Hgb urine dipstick: NEGATIVE
Ketones, ur: NEGATIVE mg/dL
Ketones, ur: NEGATIVE mg/dL
Ketones, ur: NEGATIVE mg/dL
Ketones, ur: NEGATIVE mg/dL
Protein, ur: NEGATIVE mg/dL
Protein, ur: NEGATIVE mg/dL
Specific Gravity, Urine: 1.015 (ref 1.005–1.030)
Specific Gravity, Urine: 1.02 (ref 1.005–1.030)
Specific Gravity, Urine: 1.02 (ref 1.005–1.030)
Specific Gravity, Urine: 1.02 (ref 1.005–1.030)
Urobilinogen, UA: 0.2 mg/dL (ref 0.0–1.0)
Urobilinogen, UA: 0.2 mg/dL (ref 0.0–1.0)
pH: 6 (ref 5.0–8.0)
pH: 6.5 (ref 5.0–8.0)

## 2011-02-28 NOTE — Discharge Summary (Signed)
NAME:  Brenda Greer, Brenda Greer NO.:  [id number]   MEDICAL RECORD NO.:  [id number]          PATIENT TYPE:  INP   LOCATION:  9307                          FACILITY:  [facility]   PHYSICIAN:  Norton Blizzard, MD    DATE OF BIRTH:  10-19-1985   DATE OF ADMISSION:  06/15/2008  DATE OF DISCHARGE:  06/17/2008                               DISCHARGE SUMMARY   REASON FOR HOSPITALIZATION:  [name] is a 25 year old gravida 3,  para 2-0-0-2 with class B gestational diabetes mellitus, who was  admitted from the [facility] at 29 weeks' gestational age with  uncontrolled blood glucoses.  She was admitted for modification of her  insulin regimen, further dietary teaching, as well as modifications to  her diet.  Her hospital course was essentially uncomplicated with  improvement of her blood glucoses after minor adjustment to her insulin  regimen.  Given the improvement in her blood sugars with minor changes  to her insulin regimen, I suspect her main issue is poor compliance with  her diet and insulin use at home.  I discussed this with her at length  at discharge; and also discussed the risks of consistently elevated  blood glucoses on her fetus to include problems with brain development,  problems with the heart, as well as the risk of stillbirth/ fetal  demise; also discussed various possible maternal complications.   PERTINENT LAB DATA:  Labs from her previous recent admission showed a  hemoglobin A1c of 9.9 and 24-hour urine protein of 62 mg.  Her fasting  blood glucose on her day of discharge was 94.   FINAL DIAGNOSES:  1. Intrauterine pregnancy at 2 weeks' gestational age.  2. Class B gestational diabetes mellitus.   SIGNIFICANT FINDINGS:  None.   PROCEDURES PERFORMED:  None.   CONDITION AT DISCHARGE:  Good.   INSTRUCTIONS GIVEN TO THE PATIENT:  Insulin regimen at discharge is 32  NPH and 16 Regular every morning, 14 Regular at dinner, and 16 NPH at  bedtime.  She is to  continue checking her blood sugars; fasting in the  morning and 2 hours after breakfast, 2 hours after lunch, 2 hours after  dinner, and then before bed.  She is to  continue her prenatal vitamins and she has an appointment for followup  in the [facility] on June 25, 2008 at 9:45 a.m.  She is to  bring a written log of her home blood sugars at that time.  There is no  restriction on her activity level.  She is to continue her current  diabetic diet.      Odie Sera, DO  Electronically Signed     ______________________________  Norton Blizzard, MD    MC/MEDQ  D:  06/17/2008  T:  06/18/2008  Job:  [id number]

## 2011-03-03 NOTE — Op Note (Signed)
NAME:  Brenda Greer, TROOP NO.:  [id number]   MEDICAL RECORD NO.:  [id number]          PATIENT TYPE:  WOC   LOCATION:  WOC                          FACILITY:  [facility]   PHYSICIAN:  Lesly Dukes, M.D. DATE OF BIRTH:  January 25, 1986   DATE OF PROCEDURE:  10/03/2004  DATE OF DISCHARGE:                                 OPERATIVE REPORT   PREOPERATIVE DIAGNOSES:  50.  25 year old gravida 2, para 1-0-0-1 female at 81 weeks and 5 days      estimated gestational age with breech presentation and oligohydramnios.  2.  Status post failed version.  3.  Variable decelerations on antenatal testing.   POSTOPERATIVE DIAGNOSES:  30.  25 year old gravida 2, para 1-0-0-1 female at 26 weeks and 5 days      estimated gestational age with breech presentation and oligohydramnios.  2.  Status post failed version.  3.  Variable decelerations on antenatal testing.   PROCEDURE:  Primary low flap transverse cesarean section.   SURGEON:  Lesly Dukes, M.D.   ASSISTANTMichele Mcalpine D. Okey Dupre, M.D.   ANESTHESIA:  Spinal.   ESTIMATED BLOOD LOSS:  800.   COMPLICATIONS:  None.   PATHOLOGY:  Placenta.   FINDINGS:  Viable female infant.  Apgars 8 at one minute and 9 at five  minutes.  Complete breech presentation.  Clear fluid.  Cord arterial pH  7.26.  Grossly normal placenta with three-vessel cord.  Grossly normal  uterus, ovaries, and fallopian tubes.  NICUT at delivery.   DESCRIPTION OF PROCEDURE:  After informed consent was obtained, the patient  was taken to the operating room where spinal analgesia was found to be  adequate.  The patient was placed in the dorsal supine position with a  leftward tilt and prepared and draped in the normal sterile fashion.  A  Foley catheter was in the bladder.   A Pfannenstiel skin incision was made with the scalpel, and this was carried  down to the underlying layer of fascia.  The fascia was incised in the  midline, and this incision was extended  bilaterally with the Mayo scissors.  The superior and inferior aspects of the fascial incision were grasped with  Kocher clamp, tented up, and dissected both sharply and bluntly from the  underlying layers of rectus muscles.  The rectus muscles were separated in  the midline.  The peritoneum was identified, tented up, and entered sharply  with Metzenbaum scissors.  The incision was extended both superiorly and  inferiorly, with good visualization of the bladder.  The bladder blade was  inserted.  The vesicouterine peritoneum was identified, tented up, and  entered sharply with the Metzenbaum scissors.  This incision was extended  bilaterally.  The bladder flap was created digitally.  The uterine incision  was made in a transverse fashion in the lower uterine segment with the  scalpel.  This incision was extended bilaterally both bluntly and with the  bandage scissors.  The amniotic sac was ruptured.  The complete breech was  delivered easily.  The body was rotated down to the shoulders, and each  arm  was delivered gently.  The head was then delivered easily.  The nose and  mouth were suctioned.  The cord was clamped and cut, and the baby was handed  off to the awaiting pediatrician.  Cord blood was sent for type and screen,  and the cord gas was also sent.  The uterus was exteriorized and cleared of  all clots and debris.  The uterine incision was closed with 0 Vicryl in a  running locked fashion.  A suture of Vicryl was used to reinforce the  incision.  The uterus was returned to the abdomen and noted to be hemostatic  and without tension.  The intraperitoneal cavity was copiously irrigated.  The uterine incision, peritoneum, and rectus muscles were found to be  hemostatic.  The fascia was closed with 0 Vicryl in a running fashion.  The  subcutaneous tissue was copiously irrigated and found to be hemostatic.  The  skin was closed with staples.   The patient tolerated the procedure well.   Sponge, lap, needle, and  instrument counts were correct x2.  The patient went to the recovery room in  stable condition.      KHL/MEDQ  D:  10/03/2004  T:  10/04/2004  Job:  [id number]

## 2011-03-03 NOTE — Discharge Summary (Signed)
Brenda Greer, Brenda Greer               ACCOUNT NO.:  [id number]   MEDICAL RECORD NO.:  [id number]          PATIENT TYPE:  INP   LOCATION:  9101                          [facility]ACILITY:  [facility]   PHYSICIAN:  Phil D. Okey Dupre, M.D.     DATE OF BIRTH:  Jun 03, 1986   DATE OF ADMISSION:  10/03/2004  DATE OF DISCHARGE:  10/06/2004                                 DISCHARGE SUMMARY   DISCHARGE DIAGNOSES:  1.  Breech presentation with a failed version.  2.  Fetal decelerations during nonstress test.  3.  Thirty-eight and five-sevenths weeks intrauterine pregnancy.  4.  Status post cesarean section.  5.  Gestational diabetes.   DISCHARGE MEDICATIONS:  1.  Percocet 5/325 one to two p.o. q.4-6h. p.r.n. for pain.  2.  Ibuprofen 600 mg one p.o. q.6h. p.r.n. for pain.  3.  Prenatal vitamins one p.o. daily while breastfeeding.   HOSPITAL COURSE:  This is a 25 year old Hispanic female who presented to the  MAU on October 03, 2004 after undergoing a failed version for breech 3 days  prior.  She had been seen in high risk clinic where there were fetal  decelerations noted during an NST.  She was therefore admitted for C-  section.  C-section revealed a viable female infant with Apgars 8 at one  minute and 9 at five minutes, complete breech.  The patient tolerated the  procedure well and had a routine post C-section recovery in hospital.  Additionally, the patient had gestational diabetes, was given insulin which  was discontinued postoperatively, and her blood sugars have remained in the  90s, with her highest being 110.  The patient was discharged home  postoperative day #3.  Denied any abdominal cramping or lochia.  Discharge  hemoglobin is 12.9.   FOLLOW-UP ITEMS:  1.  Six weeks follow-up with Dr. Tressia Danas on November 14, 2004 at 1:30      p.m. at [facility].  2.  Diabetic diet.      TH/MEDQ  D:  10/06/2004  T:  10/07/2004  Job:  [id number]

## 2011-07-17 LAB — POCT URINALYSIS DIP (DEVICE)
Glucose, UA: NEGATIVE
Glucose, UA: NEGATIVE
Glucose, UA: NEGATIVE
Hgb urine dipstick: NEGATIVE
Hgb urine dipstick: NEGATIVE
Ketones, ur: 15 — AB
Ketones, ur: NEGATIVE
Ketones, ur: NEGATIVE
Ketones, ur: NEGATIVE
Operator id: 194561
Operator id: 194561
Operator id: 287931
Operator id: 297281
Protein, ur: NEGATIVE
Protein, ur: NEGATIVE
Specific Gravity, Urine: 1.02
Specific Gravity, Urine: 1.015
Specific Gravity, Urine: 1.02
Specific Gravity, Urine: 1.025
Urobilinogen, UA: 0.2
Urobilinogen, UA: 0.2

## 2011-07-18 LAB — POCT URINALYSIS DIP (DEVICE)
Bilirubin Urine: NEGATIVE
Bilirubin Urine: NEGATIVE
Bilirubin Urine: NEGATIVE
Bilirubin Urine: NEGATIVE
Bilirubin Urine: NEGATIVE
Glucose, UA: NEGATIVE
Glucose, UA: NEGATIVE
Glucose, UA: NEGATIVE
Glucose, UA: NEGATIVE
Hgb urine dipstick: NEGATIVE
Ketones, ur: NEGATIVE
Ketones, ur: NEGATIVE
Ketones, ur: NEGATIVE
Ketones, ur: NEGATIVE
Nitrite: NEGATIVE
Operator id: 148111
Operator id: 287931
Operator id: 287931
Specific Gravity, Urine: 1.015
Specific Gravity, Urine: 1.02
Specific Gravity, Urine: 1.02
Urobilinogen, UA: 0.2
Urobilinogen, UA: 0.2
Urobilinogen, UA: 0.2
pH: 8.5 — ABNORMAL HIGH

## 2011-07-19 LAB — GLUCOSE, CAPILLARY
Glucose-Capillary: 103 — ABNORMAL HIGH
Glucose-Capillary: 119 — ABNORMAL HIGH
Glucose-Capillary: 71
Glucose-Capillary: 80

## 2011-07-19 LAB — POCT URINALYSIS DIP (DEVICE)
Operator id: 200901
Protein, ur: NEGATIVE
Urobilinogen, UA: 0.2

## 2011-07-20 LAB — POCT URINALYSIS DIP (DEVICE)
Bilirubin Urine: NEGATIVE
Bilirubin Urine: NEGATIVE
Glucose, UA: NEGATIVE mg/dL
Glucose, UA: NEGATIVE mg/dL
Nitrite: NEGATIVE
Nitrite: NEGATIVE
Nitrite: NEGATIVE
Protein, ur: 30 mg/dL — AB
Protein, ur: NEGATIVE mg/dL
Urobilinogen, UA: 0.2 mg/dL (ref 0.0–1.0)
Urobilinogen, UA: 0.2 mg/dL (ref 0.0–1.0)
pH: 7.5 (ref 5.0–8.0)
pH: 7.5 (ref 5.0–8.0)

## 2013-04-06 ENCOUNTER — Encounter (HOSPITAL_COMMUNITY): Payer: Self-pay | Admitting: Adult Health

## 2013-04-06 DIAGNOSIS — Z3202 Encounter for pregnancy test, result negative: Secondary | ICD-10-CM | POA: Insufficient documentation

## 2013-04-06 DIAGNOSIS — Z79899 Other long term (current) drug therapy: Secondary | ICD-10-CM | POA: Insufficient documentation

## 2013-04-06 DIAGNOSIS — E119 Type 2 diabetes mellitus without complications: Secondary | ICD-10-CM | POA: Insufficient documentation

## 2013-04-06 DIAGNOSIS — N12 Tubulo-interstitial nephritis, not specified as acute or chronic: Secondary | ICD-10-CM | POA: Insufficient documentation

## 2013-04-06 LAB — CBC WITH DIFFERENTIAL/PLATELET
Eosinophils Relative: 1 % (ref 0–5)
Lymphs Abs: 2.9 10*3/uL (ref 0.7–4.0)
MCH: 24.9 pg — ABNORMAL LOW (ref 26.0–34.0)
Neutrophils Relative %: 63 % (ref 43–77)
Platelets: 269 10*3/uL (ref 150–400)
RBC: 4.1 MIL/uL (ref 3.87–5.11)
RDW: 15.2 % (ref 11.5–15.5)

## 2013-04-06 LAB — BASIC METABOLIC PANEL
BUN: 11 mg/dL (ref 6–23)
CO2: 24 mEq/L (ref 19–32)
Calcium: 8.8 mg/dL (ref 8.4–10.5)
Creatinine, Ser: 0.46 mg/dL — ABNORMAL LOW (ref 0.50–1.10)
Glucose, Bld: 237 mg/dL — ABNORMAL HIGH (ref 70–99)

## 2013-04-06 LAB — URINALYSIS, ROUTINE W REFLEX MICROSCOPIC
Bilirubin Urine: NEGATIVE
Nitrite: NEGATIVE
Specific Gravity, Urine: 1.015 (ref 1.005–1.030)
Urobilinogen, UA: 0.2 mg/dL (ref 0.0–1.0)

## 2013-04-06 LAB — URINE MICROSCOPIC-ADD ON

## 2013-04-06 NOTE — ED Notes (Signed)
Presents with blood in urine, bilateral flank pain,  Dysuria, for 5 days. Pt has appointment with urologist in one month but pain is too much.

## 2013-04-07 ENCOUNTER — Emergency Department (HOSPITAL_COMMUNITY): Payer: Commercial Managed Care - PPO

## 2013-04-07 ENCOUNTER — Emergency Department (HOSPITAL_COMMUNITY)
Admission: EM | Admit: 2013-04-07 | Discharge: 2013-04-07 | Disposition: A | Discharge status: Home / Self Care | Payer: Commercial Managed Care - PPO | Attending: Emergency Medicine | Admitting: Emergency Medicine

## 2013-04-07 ENCOUNTER — Encounter (HOSPITAL_COMMUNITY): Payer: Self-pay | Admitting: Radiology

## 2013-04-07 DIAGNOSIS — N12 Tubulo-interstitial nephritis, not specified as acute or chronic: Secondary | ICD-10-CM

## 2013-04-07 HISTORY — DX: Type 2 diabetes mellitus without complications: E11.9

## 2013-04-07 MED ORDER — DEXTROSE 5 % IV SOLN
1.0000 g | INTRAVENOUS | Status: DC
  Administered 2013-04-07: 1 g via INTRAVENOUS
  Filled 2013-04-07: qty 10

## 2013-04-07 MED ORDER — OXYCODONE-ACETAMINOPHEN 5-325 MG PO TABS: 2.0000 | ORAL_TABLET | ORAL | Status: DC | PRN

## 2013-04-07 MED ORDER — SODIUM CHLORIDE 0.9 % IV BOLUS (SEPSIS)
1000.0000 mL | Freq: Once | INTRAVENOUS | Status: AC
  Administered 2013-04-07: 1000 mL via INTRAVENOUS

## 2013-04-07 MED ORDER — HYDROMORPHONE HCL PF 1 MG/ML IJ SOLN
1.0000 mg | Freq: Once | INTRAMUSCULAR | Status: AC
  Administered 2013-04-07: 1 mg via INTRAVENOUS
  Filled 2013-04-07: qty 1

## 2013-04-07 MED ORDER — CEPHALEXIN 500 MG PO CAPS: 500.0000 mg | ORAL_CAPSULE | Freq: Four times a day (QID) | ORAL | Status: DC

## 2013-04-07 MED ORDER — SODIUM CHLORIDE 0.9 % IV SOLN
INTRAVENOUS | Status: DC
  Administered 2013-04-07: 02:00:00 via INTRAVENOUS

## 2013-04-07 MED ORDER — ONDANSETRON HCL 4 MG/2ML IJ SOLN
4.0000 mg | Freq: Once | INTRAMUSCULAR | Status: AC
  Administered 2013-04-07: 4 mg via INTRAVENOUS
  Filled 2013-04-07: qty 2

## 2013-04-07 MED ORDER — FENTANYL CITRATE 0.05 MG/ML IJ SOLN
50.0000 ug | Freq: Once | INTRAMUSCULAR | Status: DC
  Filled 2013-04-07: qty 2

## 2013-04-07 MED ORDER — ONDANSETRON 8 MG PO TBDP: 8.0000 mg | ORAL_TABLET | Freq: Three times a day (TID) | ORAL | Status: DC | PRN

## 2013-04-07 NOTE — ED Provider Notes (Signed)
History     CSN: [id number]  Arrival date & time 04/06/13  1952   First MD Initiated Contact with Patient 04/07/13 0020      Chief Complaint  Patient presents with  . Hematuria    (Consider location/radiation/quality/duration/timing/severity/associated sxs/prior treatment) Patient is a 27 y.o. female presenting with hematuria. The history is provided by the patient.  Hematuria   patient here complaining of dysuria and hematuria x5 days with associated nausea and no vomiting. Denies any vaginal bleeding or discharge. Symptoms have been progressively worse. Patient has urological followup in one month but presents because the pain was characterized as sharp as to severe. Pain is constant and not worse with movement. Denies any rashes to her flank  Past Medical History  Diagnosis Date  . Diabetes mellitus without complication     History reviewed. No pertinent past surgical history.  History reviewed. No pertinent family history.  History  Substance Use Topics  . Smoking status: Never Smoker   . Smokeless tobacco: Not on file  . Alcohol Use: No    OB History   Grav Para Term Preterm Abortions TAB SAB Ect Mult Living                  Review of Systems  Genitourinary: Positive for hematuria.  All other systems reviewed and are negative.    Allergies  Review of patient's allergies indicates no known allergies.  Home Medications   Current Outpatient Rx  Name  Route  Sig  Dispense  Refill  . glimepiride (AMARYL) 4 MG tablet   Oral   Take 4 mg by mouth daily before breakfast.         . metFORMIN (GLUCOPHAGE) 1000 MG tablet   Oral   Take 1,000 mg by mouth 2 (two) times daily with a meal.         . Multiple Vitamin (MULTIVITAMIN WITH MINERALS) TABS   Oral   Take 1 tablet by mouth daily.           BP 128/82  Pulse 94  Temp(Src) 97.8 F (36.6 C) (Oral)  Resp 18  SpO2 100%  Physical Exam  Nursing note and vitals reviewed. Constitutional: She is  oriented to person, place, and time. She appears well-developed and well-nourished.  Non-toxic appearance. No distress.  HENT:  Head: Normocephalic and atraumatic.  Eyes: Conjunctivae, EOM and lids are normal. Pupils are equal, round, and reactive to light.  Neck: Normal range of motion. Neck supple. No tracheal deviation present. No mass present.  Cardiovascular: Normal rate, regular rhythm and normal heart sounds.  Exam reveals no gallop.   No murmur heard. Pulmonary/Chest: Effort normal and breath sounds normal. No stridor. No respiratory distress. She has no decreased breath sounds. She has no wheezes. She has no rhonchi. She has no rales.  Abdominal: Soft. Normal appearance and bowel sounds are normal. She exhibits no distension. There is no tenderness. There is no rebound and no CVA tenderness.  Musculoskeletal: Normal range of motion. She exhibits no edema and no tenderness.       Arms: Neurological: She is alert and oriented to person, place, and time. She has normal strength. No cranial nerve deficit or sensory deficit. GCS eye subscore is 4. GCS verbal subscore is 5. GCS motor subscore is 6.  Skin: Skin is warm and dry. No abrasion and no rash noted.  Psychiatric: She has a normal mood and affect. Her speech is normal and behavior is normal.  ED Course  Procedures (including critical care time)  Labs Reviewed  URINALYSIS, ROUTINE W REFLEX MICROSCOPIC - Abnormal; Notable for the following:    Glucose, UA >1000 (*)    Leukocytes, UA SMALL (*)    All other components within normal limits  CBC WITH DIFFERENTIAL - Abnormal; Notable for the following:    Hemoglobin 10.2 (*)    HCT 31.6 (*)    MCV 77.1 (*)    MCH 24.9 (*)    All other components within normal limits  BASIC METABOLIC PANEL - Abnormal; Notable for the following:    Glucose, Bld 237 (*)    Creatinine, Ser 0.46 (*)    All other components within normal limits  URINE MICROSCOPIC-ADD ON - Abnormal; Notable for the  following:    Bacteria, UA FEW (*)    All other components within normal limits  URINE CULTURE  POCT PREGNANCY, URINE   No results found.   No diagnosis found.    MDM  Pt given IV fluids and pain medications. Was also given IV antibiotics and she feels better at this time. Will treat patient for pyelonephritis        Toy Baker, MD 04/07/13 [phone]

## 2013-04-07 NOTE — Discharge Instructions (Signed)
Pielonefritis - Adultos   (Pyelonephritis, Adult)   La pielonefritis es una infección del riñón. Hay dos tipos principales de pielonefritis:   · Una infección que se inicia rápidamente sin síntomas previos (pielonefritis aguda).  · Infecciones que persisten por un largo período (pielonefritis crónica).  CAUSAS   Hay dos causas principales:   · Pasaje de bacterias desde la vejiga al riñón. Este problema aparece especialmente en mujeres embarazadas. La orina en la vejiga puede infectarse por diferentes causas, por ejemplo:  · Inflamación de la próstata (prostatitis).  · Durante las relaciones sexuales en las mujeres.  · Infección en la vejiga (cistitis).  · Pasaje de bacterias desde la sangre hacia el riñón.  Las enfermedades que aumentan el riesgo son:   · Diabetes.  · Cálculos renales o en la vesícula.  · Cáncer.  · Un catéter colocado en la vejiga.  · Otras anormalidades del riñón o de la uretra.  SÍNTOMAS   · Dolor abdominal  · Dolor en la zona del costado o flanco.  · Fiebre.  · Escalofríos.  · Malestar estomacal.  · Sangre en la orina (orina oscura).  · Necesidad frecuente de orinar  · Necesidad intensa o persistente de orinar.  · Sensación de ardor o pinchazos al orinar.  DIAGNÓSTICO   El médico diagnosticará una infección en su riñón basándose en los síntomas. También tomará una muestra de orina.   TRATAMIENTO   Generalmente el tratamiento depende de la gravedad de la infección.   · Si la infección es leve y se diagnostica a tiempo, el médico lo tratará con antibióticos por vía oral y lo dejará irse a su casa.  · Si la infección es más grave, la bacteria podría haber ingresado al torrente sanguíneo. Esto requerirá antibióticos por vía intravenosa y la permanencia en el hospital. Los síntomas pueden incluir:  · Fiebre alta.  · Dolor intenso en un costado del cuerpo.  · Escalofríos  · Aún después de haber permanecido en el hospital, el médico podrá indicarle antibióticos por vía oral durante cierto período de  tiempo.  · Podrá prescribirle otros tratamientos según la causa de la infección.  INSTRUCCIONES PARA EL CUIDADO EN EL HOGAR   · Tome los antibióticos como se le indicó. Tómelos todos, aunque se sienta mejor.  · Concurra para realizar un control y asegurarse de que la infección ha desaparecido.  · Debe ingerir gran cantidad de líquido para mantener la orina de tono claro o color amarillo pálido.  · Tome medicamentos para la vejiga si siente urgencia para orinar o lo hace con mucha frecuencia.  SOLICITE ATENCIÓN MÉDICA DE INMEDIATO SI:   · Tiene fiebre o síntomas persistentes durante más de 2 ó 3 días.  · Tiene fiebre y los síntomas empeoran.  · No puede tomar los antibióticos ni ingerir líquidos.  · Comienza a sentir escalofríos.  · Siente debilidad extrema o se desmaya.  · No mejora después de 2 días de tratamiento.  ASEGÚRESE DE QUE:   · Comprende estas instrucciones.  · Controlará su enfermedad.  · Solicitará ayuda de inmediato si no mejora o empeora.  Document Released: 07/12/2005 Document Revised: 04/02/2012  [employer]® Patient Information ©2014 [employer].

## 2013-04-07 NOTE — ED Notes (Signed)
Family at bedside. 

## 2013-04-08 LAB — URINE CULTURE

## 2013-04-09 NOTE — ED Notes (Signed)
Post ED Visit - Positive Culture Follow-up  Culture report reviewed by antimicrobial stewardship pharmacist: []  Wes Dulaney, Pharm.D., BCPS [x]  Celedonio Miyamoto, [address].D., BCPS []  Georgina Pillion, Pharm.D., BCPS []  [place], [place].D., BCPS, AAHIVP []  Estella Husk, Pharm.D., BCPS, AAHIVP  Positive urine culture Treated with Cephlexin organism sensitive to the same and no further patient follow-up is required at this time per [facility].  Larena Sox 04/09/2013, 2:07 PM

## 2013-05-19 ENCOUNTER — Encounter: Payer: Self-pay | Admitting: Family Medicine

## 2013-05-19 ENCOUNTER — Ambulatory Visit: Payer: Commercial Managed Care - PPO | Attending: Family Medicine | Admitting: Family Medicine

## 2013-05-19 VITALS — BP 113/74 | HR 96 | Temp 97.7°F | Ht 60.25 in | Wt 172.4 lb

## 2013-05-19 DIAGNOSIS — M25569 Pain in unspecified knee: Secondary | ICD-10-CM | POA: Insufficient documentation

## 2013-05-19 DIAGNOSIS — E119 Type 2 diabetes mellitus without complications: Secondary | ICD-10-CM | POA: Insufficient documentation

## 2013-05-19 DIAGNOSIS — E739 Lactose intolerance, unspecified: Secondary | ICD-10-CM

## 2013-05-19 DIAGNOSIS — M25529 Pain in unspecified elbow: Secondary | ICD-10-CM | POA: Insufficient documentation

## 2013-05-19 MED ORDER — GLIMEPIRIDE 4 MG PO TABS: 4.0000 mg | ORAL_TABLET | Freq: Every day | ORAL | Status: DC

## 2013-05-19 MED ORDER — METFORMIN HCL ER 500 MG PO TB24: 1000.0000 mg | ORAL_TABLET | Freq: Two times a day (BID) | ORAL | Status: DC

## 2013-05-19 NOTE — Progress Notes (Signed)
Patient ID: Brenda Greer, female   DOB: 20-Sep-1986, 27 y.o.   MRN: [id number]  CC:  Establish care  Interpreter used to communicate directly with patient for entire encounter including providing detailed patient instructions  HPI: Pt reports that she has had some knee pain and elbow pain for a while not associated with injury.  Pt has been trying to manage her diabetes.  She has been missing doses of medications.  She says that she forgets sometimes.    No Known Allergies Past Medical History  Diagnosis Date  . Diabetes mellitus without complication    Current Outpatient Prescriptions on File Prior to Visit  Medication Sig Dispense Refill  . Multiple Vitamin (MULTIVITAMIN WITH MINERALS) TABS Take 1 tablet by mouth daily.      . ondansetron (ZOFRAN ODT) 8 MG disintegrating tablet Take 1 tablet (8 mg total) by mouth every 8 (eight) hours as needed for nausea.  20 tablet  0   No current facility-administered medications on file prior to visit.   Family History  Problem Relation Age of Onset  . Diabetes Mother   . Hypertension Mother    History   Social History  . Marital Status: Single    Spouse Name: N/A    Number of Children: N/A  . Years of Education: N/A   Occupational History  . Not on file.   Social History Main Topics  . Smoking status: Never Smoker   . Smokeless tobacco: Not on file  . Alcohol Use: No  . Drug Use: No  . Sexually Active: Not on file   Other Topics Concern  . Not on file   Social History Narrative  . No narrative on file    Review of Systems  Constitutional: Negative for fever, chills, diaphoresis, activity change, appetite change and fatigue.  HENT: Negative for ear pain, nosebleeds, congestion, facial swelling, rhinorrhea, neck pain, neck stiffness and ear discharge.   Eyes: Negative for pain, discharge, redness, itching and visual disturbance.  Respiratory: Negative for cough, choking, chest tightness, shortness of breath,  wheezing and stridor.   Cardiovascular: Negative for chest pain, palpitations and leg swelling.  Gastrointestinal: Negative for abdominal distention.  Genitourinary: Negative for dysuria, urgency, frequency, hematuria, flank pain, decreased urine volume, difficulty urinating and dyspareunia.  Musculoskeletal: Negative for back pain, joint swelling, arthralgias and gait problem.  Neurological: Negative for dizziness, tremors, seizures, syncope, facial asymmetry, speech difficulty, weakness, light-headedness, numbness and headaches.  Hematological: Negative for adenopathy. Does not bruise/bleed easily.  Psychiatric/Behavioral: Negative for hallucinations, behavioral problems, confusion, dysphoric mood, decreased concentration and agitation.    Objective:   Filed Vitals:   05/19/13 1626  BP: 113/74  Pulse: 96  Temp: 97.7 F (36.5 C)    Physical Exam  Constitutional: Appears well-developed and well-nourished. No distress.  HENT: Normocephalic. External right and left ear normal. Oropharynx is clear and moist.  Eyes: Conjunctivae and EOM are normal. PERRLA, no scleral icterus.  Neck: Normal ROM. Neck supple. No JVD. No tracheal deviation. No thyromegaly.  CVS: RRR, S1/S2 +, no murmurs, no gallops, no carotid bruit.  Pulmonary: Effort and breath sounds normal, no stridor, rhonchi, wheezes, rales.  Abdominal: Soft. BS +,  no distension, tenderness, rebound or guarding.  Musculoskeletal: Normal range of motion. No edema and no tenderness.  Lymphadenopathy: No lymphadenopathy noted, cervical, inguinal. Neuro: Alert. Normal reflexes, muscle tone coordination. No cranial nerve deficit. Skin: Skin is warm and dry. No rash noted. Not diaphoretic. No erythema. No pallor.  Psychiatric: Normal  mood and affect. Behavior, judgment, thought content normal.   Lab Results  Component Value Date   WBC 9.4 04/06/2013   HGB 10.2* 04/06/2013   HCT 31.6* 04/06/2013   MCV 77.1* 04/06/2013   PLT 269 04/06/2013    Lab Results  Component Value Date   CREATININE 0.46* 04/06/2013   BUN 11 04/06/2013   NA 136 04/06/2013   K 3.9 04/06/2013   CL 103 04/06/2013   CO2 24 04/06/2013    Lab Results  Component Value Date   HGBA1C 8.5% 05/19/2013   Lipid Panel     Component Value Date/Time   CHOL 240* 10/18/2010 2138   TRIG 138 10/18/2010 2138   HDL 52 10/18/2010 2138   CHOLHDL 4.6 Ratio 10/18/2010 2138   VLDL 28 10/18/2010 2138   LDLCALC 160* 10/18/2010 2138       Assessment and plan:   Patient Active Problem List   Diagnosis Date Noted  . Type II or unspecified type diabetes mellitus without mention of complication, uncontrolled 05/19/2013  . Knee pain 05/19/2013  . GLUCOSE INTOLERANCE 12/13/2006  . OBESITY, NOS 12/13/2006   GLUCOSE INTOLERANCE - Plan: HgB A1c, Glucose (CBG), Ambulatory referral to diabetic education, COMPLETE METABOLIC PANEL WITH GFR, Lipid panel, TSH, Microalbumin / creatinine urine ratio  Type II or unspecified type diabetes mellitus without mention of complication, uncontrolled - Plan: Ambulatory referral to diabetic education, COMPLETE METABOLIC PANEL WITH GFR, Lipid panel, TSH, Microalbumin / creatinine urine ratio  Knee pain, unspecified laterality - Plan: COMPLETE METABOLIC PANEL WITH GFR, Lipid panel, TSH, Microalbumin / creatinine urine ratio  Referral to family medicine center for [facility] exam  Pt will take OTC tylenol or aleve as needed for arthritis pain.    Follow lab results  RTC in 3 months  The patient was given clear instructions to go to ER or return to medical center if symptoms don't improve, worsen or new problems develop.  The patient verbalized understanding.  The patient was told to call to get any lab results if not heard anything in the next week.    Rodney Langton, MD, CDE, FAAFP [facility], [place]   Results for orders placed in visit on 05/19/13  COMPLETE METABOLIC PANEL WITH GFR      Result Value Range   Sodium 138   135 - 145 mEq/L   Potassium 4.3  3.5 - 5.3 mEq/L   Chloride 106  96 - 112 mEq/L   CO2 24  19 - 32 mEq/L   Glucose, Bld 124 (*) 70 - 99 mg/dL   BUN 13  6 - 23 mg/dL   Creat 0.98  1.19 - 1.47 mg/dL   Total Bilirubin 0.2 (*) 0.3 - 1.2 mg/dL   Alkaline Phosphatase 52  39 - 117 U/L   AST 21  0 - 37 U/L   ALT 27  0 - 35 U/L   Total Protein 6.9  6.0 - 8.3 g/dL   Albumin 4.0  3.5 - 5.2 g/dL   Calcium 9.2  8.4 - 82.9 mg/dL   GFR, Est African American >89     GFR, Est Non African American >89    LIPID PANEL      Result Value Range   Cholesterol 197  0 - 200 mg/dL   Triglycerides 562 (*) <150 mg/dL   HDL 47  >13 mg/dL   Total CHOL/HDL Ratio 4.2     VLDL 38  0 - 40 mg/dL   LDL  Cholesterol 112 (*) 0 - 99 mg/dL  TSH      Result Value Range   TSH 1.710  0.350 - 4.500 uIU/mL  MICROALBUMIN / CREATININE URINE RATIO      Result Value Range   Microalb, Ur 0.95  0.00 - 1.89 mg/dL   Creatinine, Urine 295.6     Microalb Creat Ratio 7.1  0.0 - 30.0 mg/g  POCT GLYCOSYLATED HEMOGLOBIN (HGB A1C)      Result Value Range   Hemoglobin A1C 8.5%    GLUCOSE, POCT (MANUAL RESULT ENTRY)      Result Value Range   POC Glucose 127 (*) 70 - 99 mg/dl

## 2013-05-19 NOTE — Patient Instructions (Signed)
Dolor en la rodilla (Knee Pain) La rodilla es la articulacin compleja entre el muslo y la parte inferior de la pierna. En esta articulacin hay huesos, tendones, ligamentos y TEFL teacher. Los huesos que forman la rodilla son:  El fmur en el muslo.  La tibia y el peron en la pierna.  La rtula montada en la ranura de la parte inferior del muslo. CAUSAS El dolor de rodilla es una causa frecuente de [place] y puede tener varias causas. Algunas son:  Lesiones como:  Ruptura de ligamento o lesin en el tendn.  Esguince del Engineering geologist como:  Gota  Artritis  Infecciones  Uso excesivo, demasiado entrenamiento o mucha actividad fsica. El dolor de rodilla puede ser leve o intenso. Puede acompaar una lesin debilitante. Los problemas leves con frecuencia responden bien a tratamientos caseros o se mejoran por s mismas. Las lesiones ms graves pueden requerir la intervencin del mdico y [place]. SNTOMAS La rodilla es una articulacin compleja. Los sntomas pueden variar ampliamente Algunos son:  Dolor con el movimiento o al soportar peso.  Hinchazn y Engineer, mining.  Torsin de la rodilla.  Imposibilidad para estirar la rodilla.  La rodilla se traba y no puede enderezarla.  Siente calor y se observa enrojecimiento con dolor y [place].  Deformidad o dislocacin de la rtula. DIAGNSTICO Determinar cual es el problema puede ser bastante simple, como cuando hay una lesin. Tambin puede ser [employer] debido a la complejidad de la rodilla. Las pruebas para Education officer, environmental un diagnstico son:  Marily Memos y examen fsico por parte del mdico.  Radiografas para descartar otros problemas. Las radiografas no mostrarn la ruptura del TEFL teacher. Algunas lesiones en la rodilla pueden diagnosticarse del siguiente modo:  La artroscopia es una tcnica quirrgica por la que una pequea cmara de vdeo se inserta en pequeas incisiones que se hacen a los lados de la rodilla.  Este procedimiento se [place] para examinar y Therapist, sports los problemas de la articulacin interna de la rodilla. Se utilizan pequeos instrumentos para reparar el cartlago roto (meniscos).  La artrografa es una tcnica radiolgica. Se inyecta un lquido de contraste en la articulacin de la rodilla. Las estructuras internas de la articulacin de la rodilla se hacen visibles en una pelcula de rayos X.  Las imgenes por resonancia magntica son un procedimiento en el que los campos magnticos y una computadora producen imgenes en dos o tres dimensiones del interior de la rodilla. La ruptura del cartlago es visible con esta tcnica. La resonancia magntica ha reemplazado a la artrografa en el diagnstico de la ruptura del cartlago de la rodilla.  Anlisis de [place].  Examen del lquido que lubrica la articulacin de la rodilla (lquido sinovial). Se realiza tomando [place] con [place]. TRATAMIENTO El tratamiento de los problemas de la rodilla depende fundamentalmente de la causa. Algunos de estos tratamientos son:  Segn sea la lesin, un yeso o entablillado, ciruga o fisioterapia.  Permtase el tiempo adecuado de recuperacin. No use demasiado su extremidad lesionada. Si siente dolor durante los ejercicios de rutina, suspndalos. Hgalos ms lentos o realice menos repeticiones.  En el caso de actividades repetitivas como andar en bicicleta o correr, mantenga la fuerza y Neomia Dear buena nutricin.  Alterne los grupos musculares. Por ejemplo, si levanta pesas, trabaje la parte superior del cuerpo Civil engineer, contracting, y la parte inferior al da siguiente.  Ni los msculos firmes ni los dbiles proporcionan un sostn adecuado a la rodilla. Los msculos no absorben el estrs que  se ejerce sobre la articulacin de la rodilla. Mantenga fuertes los msculos que rodean a la rodilla.  Cudese de los problemas mecnicos:  Si tiene pie plano, los zapatos ortopdicos o especiales pueden ayudar.  Comunquese con el profesional que lo asiste si necesita ayuda adicional.  Los soporte de arco con bordes en la zona interna o interna del taco pueden ayudar. Cambian la presin del lado de la rodilla ms comprometido por la osteoartritis.  Podrn colocarle una ortesis de rodilla para aliviar la presin en la zona ms artrtica de la rodilla.  Si el profesional le ha prescripto muletas, ortesis, un vendaje o hielo, hgalo segn las indicaciones. El acrnimo para este tratamiento es PRICE. Significa proteccin, reposo, hielo, compresin y elevacin.  Los antiinflamatorios no esteroides, pueden ayudar a Best boy. Pero si se toman inmediatamente luego de la lesin, podran aumentar la hinchazn. Tome los corticoides luego de Soil scientist. Suspndalos si tiene problemas estomacales. No los tome si tiene una historia de Aeronautical engineer, Social research officer, government en el estmago o hemorragia intestinal. No lo tome sin la aprobacin del profesional que la asiste si tiene problemas de retencin de lquidos, insuficencia cardaca o problemas renales.  En los casos crnicos, la fisioterapia puede ser de [place].  La glucosamina y el condroitin son suplementos dietarios de Radio broadcast assistant. Ambos pueden Best boy de la osteoartritis de la rodilla. Estos medicamentos son diferentes de los antiinflamatorios habituales. La glucosamina puede disminuir el porcentaje de destruccin del cartlago.  Las inyecciones de corticoides en la articulacin de la rodilla reducen los sntomas de un brote de artritis. Ofrecen alivio que dura algunos meses. Hay que esperar algunos meses entre la aplicacin de inyecciones. Las inyecciones tiene un pequeo riesgo de infeccin, retencin de lquidos y Agricultural consultant de los niveles de Museum/gallery exhibitions officer.  El cido hialurnico inyectado en las articulaciones lesionadas puede aliviar el dolor y proporciona lubricacin. Estas inyecciones funcionan bien reduciendo la inflamacin. Una serie de inyecciones puede  proporcionar alivio durante seis meses.  [place]. Aplicar ciertos ungentos sobre la piel puede ayudar a Best boy y la rigidez de la osteoartritis. Consulte con el farmacutico, si es necesario. Muchos medicamentos de venta libre estn aprobados para el alivio temporario del dolor artrtico.  En algunos pases los mdicos prescriben antiinflamatorios no esteroides para el alivio de los trastornos crnicos como la artritis y la tendinitis. Un estudio del tratamiento con antiinflamatorios no esteroides aplicados en crema, demostr que funcionaban bien, as como administrados por va oral, pero sin el peligro de los [place]. PREVENCIN  Mantenga un peso normal. Los kilos de ms agregan tensin a las articulaciones.  Mantngase fuerte y gil. Los msculos dbiles son Ardelia Mems causa frecuente de lesiones en la rodilla. La elongacin es importante. Incluya ejercicios de flexibilidad en sus rutinas.  Practique actividad fsica con inteligencia. Si sufre osteoartritis, dolor crnico en la rodilla o lesiones recurrentes, podr ser necesario que modifique el modo en que se ejercita. No significa que deba volverse inactivo. Si le duelen las rodillas despus de correr o jugar basketball, considere la prctica de la natacin, ejercicios aerbicos en el agua u otras actividades de bajo [place], al menos durante algunos das o [employer]. En algunos casos, el Kellogg actividades de alto impacto ofrece [place].  Asegrese que sus zapatos le [place]. Elija el calzado deportivo adecuado para su deporte.  Proteja sus rodillas. Use la proteccin adecuada para las actividades que puedan afectar a sus rodillas. Use rodilleras cuando juegue al  vley o se arrodille. Colquese el cinturn de seguridad cada vez que conduzca. La mayor parte de las fracturas de rtula ocurren en accidentes automvilsticos.  Descanse cuando se sienta cansado. SOLICITE ATENCIN MDICA SI: Tiene dolor en la  rodilla que es continuo y no parece mejorar.  SOLICITE ATENCIN MDICA DE INMEDIATO SI:  La articulacin de la rodilla se siente caliente al tacto y usted tiene fiebre. EST SEGURO QUE:   Comprende las instrucciones para el alta mdica.  Controlar su enfermedad.  Solicitar atencin mdica de inmediato segn las indicaciones. Document Released: 03/20/2008 Document Revised: 12/25/2011 [facility] Patient Information 2014 [place], [place]. Dieta de 1800 caloras para el plan de alimentacin para la diabetes  (1800 Calorie Diet for Diabetes Meal Planning)  Esta dieta ha sido diseada para comer hasta 1800 caloras cada da. Si sigue esta dieta y elije comidas saludables podr Probation officer en general. Esta dieta controla los niveles de azcar en la sangre (glucosa) y tambin puede ayudar a disminuir la presin arterial y Print production planner.  TAMAO DE LAS PORCIONES:  La medicin de los alimentos y el tamao de las porciones lo ayudar a Scientist, physiological cantidad exacta de comida que debe ingerir. La lista que sigue le mostrar el tamao de algunas porciones comunes.   1 onzas (28 gr)........4 dados apilados.  3 onzas (85 gr) .Marland KitchenMarland KitchenMarland KitchenMarland Kitchen1 mazo de cartas.  1 cucharadita.......... la punta del dedo [place].  1 cucharada.............el pulgar.  2 cucharadas...........una pelota de golf.   taza      ..la mitad de un puo.  1 taza       un puo. GUA PARA LA ELECCIN DE LOS ALIMENTOS  El objetivo de esta dieta es consumir alimentos variados y Film/video editor la cantidad de caloras a 1800 por Futures trader. Esto puede lograrse eligiendo los alimentos bajos en caloras y en grasas. La dieta tambin aconseja consumir con frecuencia porciones pequeas de alimentos. Esto ayuda a [employer]on[employer] de glucosa en la sangre de modo que no suba o baje demasiado. Cada comida o colacin puede incluir un alimento que sea fuente de protenas para que lo ayude a sentirse ms satisfecho y que se estabilice su nivel de glucosa en la  sangre. Trate de consumir aproximadamente la misma cantidad de alimentos a la [employer] International. Hgalo tambin Energy [employer] Partners fines de [place], cuando viaje y [address] en que no trabaje. Separe sus comidas con una diferencia de 4 a 5 horas y agregue una Automatic Data, si lo desea.  Por ejemplo, un plan de alimentacin diaria podra incluir el desayuno, una colacin por la maana, el almuerzo, la cena y una colacin por la noche. Las comidas y colaciones saludables deben incluir granos enteros, verduras, frutas, carnes magras, aves, pescado y productos lcteos. Al planificar sus comidas, seleccione alimentos variados. Elija [employer] de panes y [place], vegetales, frutas, productos lcteos y carnes o protenas. A continuacin se dan algunos ejemplos de alimentos de cada grupo y se recomienda el tamao de las porciones. Use tazas y cucharas de medir para familiarizarse con la medida de una porcin saludable.  Panes y fculas Cada porcin equivale a 15 gramos de carbohidratos.   1 rebanada de pan.   de bagel.   de taza de cereal fro (sin azcar).   taza de cereal caliente o pur de papas.  1 papa pequea (tamao de un mouse de ordenador).   taza de pasta cocida o arroz.   muffin ingls.  1 taza de  sopa a base de caldo.  3 tazas de palomitas de maz.  4 a 6 galletas de trigo integral.   taza de frijoles, guisantes o maz cocidos. Vegetales Cada porcin equivale a 5 gramos de carbohidratos.   taza de vegetales cocidos.  1 taza de vegetales crudos.   taza de jugo de tomate o verduras. Frutas Cada porcin equivale a 15 gramos de carbohidratos.  1 manzana o naranja pequea.  1  taza de sanda o fresas.   taza de pur de manzana (sin aadir azcar).  2 cucharadas de pasas.   banana.   taza de fruta enlatada, envasadas   en agua, en su propio jugo o endulzada con un sustituto del azcar.   taza de jugo de fruta sin azcar. Lcteos: Cada porcin  equivale a 7 a 15 gramos de carbohidratos.   1 taza de [employer].  6 oz (170 g) de yogur endulzado artificialmente o yogur natural.  1 taza de ricota baja en grasa.  1 taza de leche de soja.  1 taza de leche de [place]out[place]. Carnes/protenas  1 huevo grande.  2 a 3 onzas (56 a 85 g) de carne, aves o pescado.   de taza de queso cottage con bajo contenido de [place].  1 cucharada de [place] de man.  1 oz queso bajo en grasas.   de taza de atn en agua.   taza de tofu. Grasas:  1 cucharadita de aceite.  1 cucharadita de margarina sin grasa trans.  1 cucharadita de mantequilla.  1 cucharadita de mayonesa.  2 cucharadas de aguacate.  1 cucharada de aderezo para ensaladas.  1 cucharada de queso crema.  2 cucharadas de crema agria. EJEMPLO DE DIETA DE 1800 CALORAS  Desayuno   de taza de cereal sin azcar (1 porcin de carbohidratos).  1 taza de [employer] (1 porcin de carbohidratos).  1 rebanada de pan integral tostado (1 porcin de carbohidratos).   banana pequea (1 porcin de carbohidratos).  1 huevo revuelto.  1 cucharadita de margarina sin grasa trans. Almuerzo:  [place] de atn.  2 rebanadas de pan integral (2 porciones de carbohidratos).   taza de atn [employer], escurrido.  1 cucharada de mayonesa baja en grasa.  1 tallo de apio, picado.  2 rebanadas de tomate.  1 hoja de Company secretary.  1 taza de bastones de zanahorias.  24 a 30 uvas sin semillas (2 porciones de carbohidratos).  6 oz (170 g) de yogur light (1 porcin de carbohidratos). Colacin de media tarde  3 galletas graham (1 porcin de carbohidratos).  Leche descremada, 1 taza (1 porcin de carbohidratos).  1 cucharada de [place] de man. Cena  3 oz (80 gr) de salmn a la parrilla con 1 cucharadita de aceite.  1 taza de pur de papas (2 porciones de carbohidratos) con 1 cucharadita de margarina sin grasa trans.  1 taza de judas verdes  frescas o congeladas.  1 taza.  1 taza de [employer] (1 porcin de carbohidratos). Colacin de la noche  3 tazas de palomitas de maz (1 porcin de carbohidratos).  con 2 cucharadas de queso parmesano. PLAN DE COMIDAS  Puede utilizar esta hoja para realizar su plan de comidas basndose en las indicaciones para la dieta de 1800 caloras. Si usted est [employer] plan para ayudar a Chief Operating Officer su glucosa en la sangre, es posible intercambiar alimentos que contienen carbohidratos (lcteos, fculas y frutas). Seleccione una variedad de alimentos frescos de diferentes colores y sabores. La cantidad  total de carbohidratos en sus comidas o meriendas es ms importante que asegurarse de incluir todos los grupos de alimentos cada vez que come. Elija entre los siguientes alimentos para elaborar las comidas del da:   8 porciones de fculas.  4 porciones de vegetales.  3 porciones de frutas.  2 porciones de lcteos.  6 a 7 oz (168 a 196 g) de carne / protenas.  Hasta 4 porciones de grasas. Su dietista puede utilizar esta hoja de [place] para ayudarle a decidir cuntas porciones y los tipos de alimentos que son perfectos para usted.  DESAYUNO Grupo de alimentos y porciones /eleccin de los alimentos Fculas ________________________________________________________  Sheppard Penton _________________________________________________________  Nils Pyle _________________________________________________________  Carnes/protenas ________________________________________________________  Rosalin Hawking __________________________________________________________  Lorin Mercy de alimentos y porciones Fculas ________________________________________________________  Carnes/protenas ________________________________________________________  Rufina Falco ________________________________________________________  Lou Miner _________________________________________________________  Sheppard Penton  _________________________________________________________  Rosalin Hawking __________________________________________________________  Waldo Laine DE LA NOCHE Grupo de alimentos y porciones Fculas ________________________________________________________  Carnes/protenas __________________________________________________  Nils Pyle _________________________________________________________  Sheppard Penton _________________________________________________________  Salli Quarry de alimentos y porciones Fculas ________________________________________________________  Carnes/protenas __________________________________________________  Sheppard Penton _________________________________________________________  Vegetales ________________________________________________________  Lou Miner _________________________________________________________  Rosalin Hawking __________________________________________________________  Waldo Laine DE LA NOCHE Grupo de alimentos y porciones /eleccin de los alimentos Frutas _________________________________________________________  Carnes/protenas __________________________________________________  Sheppard Penton _________________________________________________________  Karl Pock ________________________________________________________  Valorie Roosevelt ____________________________  Hoover Brunette _________________________  Nils Pyle _____________________________  Lcteos_____________________________  Carnes/protenas______________________  Rosalin Hawking _______________________________  Document Released: 01/17/2007 Document Revised: 12/25/2011 ExitCare Patient Information 2014 [place] [place]. Monitoreo de Banker, adulto (Blood Sugar Monitoring, Adult) MEDIDORES DE GLUCOSA PARA EL AUTOCONTROL DE LA GLUCOSA EN SANGRE Para toda persona diabtica es importante poder medir correctamente su nivel de glucosa en sangre. Puede usar Occupational hygienist (un pequeo dispositivo operado a batera) para controlar su  nivel de glucosa en cualquier momento. Esto le permite usted y a su mdico Chief Operating Officer su diabetes y Production assistant, radio efectividad del plan de tratamiento. El proceso de medicin de la propia glucosa en sangre con un medidor se denomina autocontrol de la glucosa en sangre. Cuando las personas diabticas se Development worker, community en sangre (glucosa), su salud mejora. Para controlar la glucosa con un medidor clsico, coloque una tira reactiva desechable. Luego coloque una gota de sangre en la tira reactiva. Coloque la Environmental consultant. Las tiras de prueba estn cubiertas con qumicos que se combinan con la glucosa de la [place]. El medidor indica la cantidad actual de glucosa. Este medidor muestra el nivel de glucosa en nmeros. Varios modelos nuevos pueden grabar y Academic librarian cierta cantidad de resultados de [place]. Algunos modelos pueden conectarse a ordenadores personales para [employer] de las pruebas o imprimirlas.  Los nuevos glucmetros con frecuencia son ms fciles de usar que los ms antiguos. Algunos aparatos permiten extraer sangre de otras zonas adems de la yema del dedo. Algunos modelos nuevos poseen temporizador automtico, cdigos de error, lectores por seales o barra de cdigos para ayudar al ajuste correcto (calibracin). Otros tienen una pantalla grande o instrucciones habladas para las personas con dficits visuales.  INSTRUCCIONES PARA EL USO DEL MEDIDOR DE GLUCOSA  Lvese en las manos con agua y jabn o limpie la zonas con alcohol. Seque bien sus manos.  Pinche un lado de la yema del dedo con una lanceta (un pequeo instrumento manual con punta afilada).  Baje la mano y sostenga el dedo hasta que aparezca una pequea gota de [place]. Coloque la [employer] Noble.  Siga las instrucciones para insertar la tirilla y Lexicographer.  El medidor debe encenderse y luego hay que insertar la tira reactiva antes de aplicar la [place] de [place].  Anote el resultado de la  prueba.  Debe leer cuidadosamente las indicaciones tanto para el uso del medidor como de las tirillas. Las instrucciones para el uso del medidor se encuentran en el manual del usuario. Conserve el manual ya que podr ayudarlo a solucionar algunos problemas que puedan surgir. Muchos medidores usan "cdigos de error" cuando hay un problema con el aparato, la tirilla o la [place]. Necesitar el manual para interpretar estos cdigos de error y Product/process development scientist.  Nuevos dispositivos estn a la venta, Chief Financial Officer y medidores que pueden Education officer, environmental la prueba de sangre tomndola de "sitios alternativos" del organismo, [place] de los dedos. Sin embargo utilice pruebas estndar si su nivel de glucosa cambia rpidamente. Tambin use una prueba estndar si:  Ha comido, ha practicado actividad fsica o ha tomado insulina en las ltimas 2 horas.  Piensa que su nivel de glucosa es bajo.  Tiene tendencia a no sentir los sntomas de bajo nivel de glucosa (hipoglucemia).  Est enfermo o bajo una situacin de estrs.  Limpie el glucmetro segn las indicaciones del fabricante.  Pruebe el glucmetro segn las indicaciones del fabricante.  Lleve el medidor en su visita al consultorio del mdico. De este modo podr probar el medidor de glucosa mientras el profesional verifica su tcnica para Chief Financial Officer medicin y asegurarse que est utilizando el medidor correctamente. El mdico tambin puede tomar [place] de sangre usando un mtodo de laboratorio de [place]. Si los valores del medidor de glucosa coinciden con los del laboratorio, usted y Mining engineer podrn comprobar que el medidor funciona bien y que est usando la Careers information officer. El mdico indicar qu hacer si los resultados no coinciden. FRECUENCIA DE LA PRUEBA El mdico le aconsejara con qu frecuencia debe controlar su nivel de glucosa en sangre. Esto depender de su tipo de diabetes, como su nivel actual de control de diabetes y  el tipo de medicamento que toma. A continuacin se indican pautas generales, pero su plan personal puede ser diferente. Registre [employer] y el momento del da para que su mdico pueda revisarlos.   Diabetes tipo 1.  Cuando [place] insulina con un buen control de la diabetes (ya sea con mltiples inyecciones diarias o a travs de una bomba), debe controlar su nivel de glucosa 4 veces por da.  Si la diabetes no puede controlarse adecuadamente, ser necesario un control ms frecuente, por ejemplo antes de las comidas y [place] horas despus de las mismas, en el momento de irse a dormir y [employer] 2 y las 3 a.m.  Debe controlar siempre su nivel de glucosa antes de recibir una dosis de insulina o antes de modificar la proporcin en la bomba de insulina.  Diabetes tipo 2.  Las pautas para el auto control del nivel de glucosa en sangre en la diabetes tipo 2 no estn bien definidas.  Si recibe insulina, siga las pautas ya indicadas.  Si toma medicamentos pero no recibe insulina, y su nivel de glucosa no est bien controlado, debe verificarlos al [employer] por da.  Si no recibe insulina y su diabetes est controlada slo con medicamentos o dieta, debe verificar el nivel de glucosa en sangre al menos una vez por da, generalmente antes del desayuno.  Un perfil semanal podr ser de utilidad para que su mdico lo aconseje acerca de su plan de cuidados.  La semana anterior a su visita, controle su glucosa antes de las comidas, y 2 horas despus de las mismas diariamente. Puede realizar la prueba antes y despus de las distintas comidas de cada da para que usted y el mdico puedan ver si los niveles de International aid/development worker en sangre estn controlados en un perodo de 24 horas.  Diabetes gestacional.  Es necesario repetir la prueba con frecuencia. Es importante la precisin en el momento en que la realiza.  Si no recibe insulina, controle su nivel de glucosa 4 veces por da. antes del desayuno y 1  hora despus del inicio de cada comida.  Si utiliza insulina, controle su nivel de glucosa 6 veces por da. antes del desayuno y 1 hora despus del inicio de cada comida.  Normativas generales.  En el inicio de un tratamiento es necesario realizar controles ms frecuentes. El mdico lo asesorar.  Mida su nivel de glucosa en cada momento en que sospeche que tiene un bajo nivel de Production assistant, radio (hipoglucemia).  Deber controlarse con ms frecuencia cuando [employer], cuando pase por alguna situacin de estrs que no sea habitual, en caso de enfermedad, o en otras circunstancias poco frecuentes. OTRAS COSAS QUE DEBE SABER ACERCA DE LOS GLUCMETROS  Amplitud de medida: La mayor parte de los medidores de glucosa pueden mostrar niveles comprendidos en un amplio margen de [place], desde cifras tan bajas como 0, hasta niveles tan elevados como 600 mg/dL. Si el [employer] niveles muy elevados o muy bajos, deber confirmarlos con otra medicin. Informe a su mdico cuando los valores sean demasiado elevados o demasiado bajos.  Niveles de glucosa en sangre completa vs. niveles de glucosa en plasma: Algunos dispositivos caseros antiguos medan la glucosa en toda la sangre. Cuando se hace en el laboratorio, o con algunos medidores nuevos, la glucosa se mide en el plasma (un componente de la [place]). La diferencia puede ser importante. Es importante que usted y Mining engineer que lo asiste sepan si su medidor proporciona [employer] como "equivalente de sangre completa" o "equivalente de plasma".  Visualizacin de niveles elevados y bajos de glucosa: Parte del aprendizaje del manejo del medidor es el conocimiento del significado de [place]. Asegrese de Solicitor las concentraciones ms elevadas y ms bajas que el medidor indica.  Factores que afectan el rendimiento del Office manager. La precisin de [employer] de las pruebas dependen de muchos factores y varan segn la marca  y el tipo de medidor. Aqu se incluyen:  Bajo recuento de glbulos rojos (anemia).  Sustancias presentes en la sangre (cido rico, vitamina C y otros).  Factores ambientales (temperatura, humedad, altitud).  Tiras reactivas de marca versus tirar reactivas genricas.  Calibracin. Asegrese que su glucmetro est correctamente calibrado. Es Neomia Dear buena idea hacer una prueba de calibracin con la solucin de control recomendada por el fabricante, cada vez que comience a usar un envase nuevo de tiras reactivas. Esto ayuda a verificar la precisin del medidor.  Tiras reactivas mal almacenadas, vencidas o defectuosas. Mantenga las tiras Writer seco y bien tapadas.  Medidor daado.  Muestra de [employer] NUEVAS TECNOLOGAS PARA LA MEDICIN DE LA GLUCOSA Lugares alternativos para tomar la muestra Algunos glucmetros permiten tomar muestras en sitios alternativos. Por ejemplo:  Brazo.  Antebrazo.  Base del pulgar.  Muslos. Puede ser necesario tomar muestras en sitios alternativos. Sin embargo, esto puede tener algunas limitaciones. La sangre que se obtiene de la yema de los dedos puede mostrar modificaciones en  los niveles de glucosa ms rpidamente que la sangre que proviene de otras partes del organismo. Esto significa que los resultados de las pruebas de lugares alternativos pueden ser diferentes de los de la yema del dedo debido a que la concentracin presente de glucosa puede ser diferente y no por la capacidad del medidor para Printmaker prueba con precisin.  Control continuo de la glucosa en sangre Ya se dispone de algunos dispositivos para medir de Wellsite geologist continua la glucosa en sangre y otros estn en desarrollo. Estos mtodos pueden ser ms caros que el autocontrol con Occupational hygienist. Sin embargo, su efectividad y confiabilidad es incierta . El Brewing technologist acerca de adoptar o no este mtodo. CONTROLNDO SU NIVEL DE GLUCOSA EN SANGRE, LAS PERSONAS  DIABTICAS TENDRN MS SALUD  El autocontrol de la glucosa es una parte importante dentro del plan de tratamiento de los pacientes que sufren diabetes mellitus. A continuacin se indican algunos motivos para realizar el autocontrol de glucosa en sangre:   Puede confirmar que su nivel de glucosa reencuentra en un nivel especfico, saludable.  Detecta hipoglucemia e hiperglucemia grave.  Le permite a usted y al mdico hacer ajustes en respuesta a los cambios en el estilo de vida en aquellas personas que requieren medicamentos.  Determina la necesidad de comenzar una terapia con insulina en la diabetes pasajera que se produce durante el embarazo (diabetes mellitus gestacional). Document Released: 10/02/2005 Document Revised: 12/25/2011 [facility] Patient Information 2014 [place], [place].

## 2013-05-20 ENCOUNTER — Telehealth: Payer: Self-pay | Admitting: *Deleted

## 2013-05-20 LAB — LIPID PANEL
Cholesterol: 197 mg/dL (ref 0–200)
VLDL: 38 mg/dL (ref 0–40)

## 2013-05-20 LAB — COMPLETE METABOLIC PANEL WITH GFR
AST: 21 U/L (ref 0–37)
Albumin: 4 g/dL (ref 3.5–5.2)
BUN: 13 mg/dL (ref 6–23)
Calcium: 9.2 mg/dL (ref 8.4–10.5)
Chloride: 106 mEq/L (ref 96–112)
GFR, Est Non African American: >89 mL/min
Glucose, Bld: 124 mg/dL — ABNORMAL HIGH (ref 70–99)
Potassium: 4.3 mEq/L (ref 3.5–5.3)
Sodium: 138 mEq/L (ref 135–145)
Total Protein: 6.9 g/dL (ref 6.0–8.3)

## 2013-05-20 LAB — MICROALBUMIN / CREATININE URINE RATIO
Creatinine, Urine: 133.3 mg/dL
Microalb, Ur: 0.95 mg/dL (ref 0.00–1.89)

## 2013-05-20 LAB — TSH: TSH: 1.71 u[IU]/mL (ref 0.350–4.500)

## 2013-05-20 NOTE — Progress Notes (Signed)
Quick Note:  Please inform patient that her labs came back OK except that her cholesterol levels were elevated. Work on low fat low cholesterol diet and exercise 5x per week. Recheck cholesterol levels in 3 months. Pt may need cholesterol medication if can't get LDL cholesterol below 100.   Rodney Langton, MD, CDE, FAAFP [facility] [place], [place]   ______

## 2013-05-20 NOTE — Telephone Encounter (Signed)
05/20/13 Spoke with patient daughter made aware of lab results below.  Please inform patient that her labs came back OK except that her cholesterol levels were elevated. Work on low fat low cholesterol diet and exercise 5x per week. Recheck cholesterol levels in 3 months. Pt may need cholesterol medication if can't get LDL cholesterol below 100 [facility]

## 2013-05-22 ENCOUNTER — Ambulatory Visit: Payer: Commercial Managed Care - PPO

## 2013-06-04 ENCOUNTER — Telehealth: Payer: Self-pay | Admitting: Internal Medicine

## 2013-07-15 NOTE — Telephone Encounter (Signed)
Close encounter 

## 2013-07-16 ENCOUNTER — Ambulatory Visit: Payer: Commercial Managed Care - PPO | Admitting: *Deleted

## 2013-08-19 ENCOUNTER — Ambulatory Visit: Payer: Commercial Managed Care - PPO

## 2016-07-28 ENCOUNTER — Emergency Department (HOSPITAL_COMMUNITY)
Admission: EM | Admit: 2016-07-28 | Discharge: 2016-07-28 | Disposition: A | Discharge status: Home / Self Care | Payer: Commercial Managed Care - PPO | Attending: Emergency Medicine | Admitting: Emergency Medicine

## 2016-07-28 ENCOUNTER — Encounter (HOSPITAL_COMMUNITY): Payer: Self-pay | Admitting: *Deleted

## 2016-07-28 DIAGNOSIS — N12 Tubulo-interstitial nephritis, not specified as acute or chronic: Secondary | ICD-10-CM | POA: Diagnosis not present

## 2016-07-28 DIAGNOSIS — Z7984 Long term (current) use of oral hypoglycemic drugs: Secondary | ICD-10-CM | POA: Insufficient documentation

## 2016-07-28 DIAGNOSIS — E119 Type 2 diabetes mellitus without complications: Secondary | ICD-10-CM | POA: Diagnosis not present

## 2016-07-28 DIAGNOSIS — R300 Dysuria: Secondary | ICD-10-CM | POA: Diagnosis present

## 2016-07-28 LAB — I-STAT BETA HCG BLOOD, ED (MC, WL, AP ONLY)

## 2016-07-28 LAB — I-STAT CHEM 8, ED
BUN: 6 mg/dL (ref 6–20)
CHLORIDE: 103 mmol/L (ref 101–111)
CREATININE: 0.3 mg/dL — AB (ref 0.44–1.00)
Calcium, Ion: 1.24 mmol/L (ref 1.15–1.40)
Glucose, Bld: 199 mg/dL — ABNORMAL HIGH (ref 65–99)
HEMATOCRIT: 35 % — AB (ref 36.0–46.0)
HEMOGLOBIN: 11.9 g/dL — AB (ref 12.0–15.0)
POTASSIUM: 3.8 mmol/L (ref 3.5–5.1)
Sodium: 139 mmol/L (ref 135–145)
TCO2: 24 mmol/L (ref 0–100)

## 2016-07-28 LAB — URINALYSIS, ROUTINE W REFLEX MICROSCOPIC
BILIRUBIN URINE: NEGATIVE
Glucose, UA: >1000 mg/dL — AB
HGB URINE DIPSTICK: NEGATIVE
KETONES UR: 15 mg/dL — AB
NITRITE: POSITIVE — AB
PROTEIN: 30 mg/dL — AB
SPECIFIC GRAVITY, URINE: 1.023 (ref 1.005–1.030)
pH: 8 (ref 5.0–8.0)

## 2016-07-28 LAB — URINE MICROSCOPIC-ADD ON: RBC / HPF: NONE SEEN RBC/hpf (ref 0–5)

## 2016-07-28 MED ORDER — DEXTROSE 5 % IV SOLN
1.0000 g | Freq: Once | INTRAVENOUS | Status: AC
  Administered 2016-07-28: 1 g via INTRAVENOUS
  Filled 2016-07-28: qty 10

## 2016-07-28 MED ORDER — SULFAMETHOXAZOLE-TRIMETHOPRIM 800-160 MG PO TABS: 1.0000 | ORAL_TABLET | Freq: Two times a day (BID) | ORAL | 0 refills | Status: AC

## 2016-07-28 MED ORDER — METFORMIN HCL ER 500 MG PO TB24: 1000.0000 mg | ORAL_TABLET | Freq: Two times a day (BID) | ORAL | 3 refills | Status: DC

## 2016-07-28 MED ORDER — HYDROCODONE-ACETAMINOPHEN 5-325 MG PO TABS: 1.0000 | ORAL_TABLET | Freq: Four times a day (QID) | ORAL | 0 refills | Status: DC | PRN

## 2016-07-28 MED ORDER — GLIMEPIRIDE 4 MG PO TABS: 4.0000 mg | ORAL_TABLET | Freq: Every day | ORAL | 3 refills | Status: DC

## 2016-07-28 MED ORDER — ONDANSETRON HCL 4 MG/2ML IJ SOLN
4.0000 mg | Freq: Once | INTRAMUSCULAR | Status: AC
  Administered 2016-07-28: 4 mg via INTRAVENOUS
  Filled 2016-07-28: qty 2

## 2016-07-28 MED ORDER — SODIUM CHLORIDE 0.9 % IV BOLUS (SEPSIS)
1000.0000 mL | Freq: Once | INTRAVENOUS | Status: AC
  Administered 2016-07-28: 1000 mL via INTRAVENOUS

## 2016-07-28 MED ORDER — HYDROCODONE-ACETAMINOPHEN 5-325 MG PO TABS
1.0000 | ORAL_TABLET | Freq: Once | ORAL | Status: AC
  Administered 2016-07-28: 1 via ORAL
  Filled 2016-07-28: qty 1

## 2016-07-28 NOTE — ED Notes (Signed)
Pt ambulated to the restroom independently 

## 2016-07-28 NOTE — ED Notes (Signed)
2 IV attempts failed

## 2016-07-28 NOTE — ED Provider Notes (Signed)
[facility] DEPT Provider Note   CSN: [id number] Arrival date & time: 07/28/16  [id number]     History   Chief Complaint Chief Complaint  Patient presents with  . Dysuria  . Urinary Frequency    HPI Brenda Greer is a 30 y.o. female.  Patient is 30 yo F with PMH of diabetes, Spanish speaking only (history obtained through interpreter service) presenting with chief complaint of dysuria and suprapubic abdominal pain starting 1 week ago. She states the pain has been constant, is dull "pressure," with associated right flank pain. She reports vomiting twice yesterday afternoon, but denies fever or chills. She states there is no chance she could be pregnant, and is not currently sexually active. Denies any pelvic pain, vaginal discharge, or vaginal bleeding. Patient has not taken diabetes medicine in 1 month because her PCP's clinic has closed and she's been unable to fill her prescription.      Past Medical History:  Diagnosis Date  . Diabetes mellitus without complication [facility](HCC)     Patient Active Problem List   Diagnosis Date Noted  . Type II or unspecified type diabetes mellitus without mention of complication, uncontrolled 05/19/2013  . Knee pain 05/19/2013  . GLUCOSE INTOLERANCE 12/13/2006  . OBESITY, NOS 12/13/2006    History reviewed. No pertinent surgical history.  OB History    No data available       Home Medications    Prior to Admission medications   Medication Sig Start Date End Date Taking? Authorizing Provider  glimepiride (AMARYL) 4 MG tablet Take 1 tablet (4 mg total) by mouth daily before breakfast. 05/19/13  Yes [place] Cyndie MullL Johnson, MD  metFORMIN (GLUCOPHAGE XR) 500 MG 24 hr tablet Take 2 tablets (1,000 mg total) by mouth 2 (two) times daily with a meal. 05/19/13  Yes [place] Cyndie MullL Johnson, MD  Multiple Vitamin (MULTIVITAMIN WITH MINERALS) TABS Take 1 tablet by mouth daily.    Historical Provider, MD  ondansetron (ZOFRAN ODT) 8 MG disintegrating  tablet Take 1 tablet (8 mg total) by mouth every 8 (eight) hours as needed for nausea. Patient not taking: Reported on 07/28/2016 04/07/13   Lorre NickAnthony Allen, MD    Family History Family History  Problem Relation Age of Onset  . Diabetes Mother   . Hypertension Mother     Social History Social History  Substance Use Topics  . Smoking status: Never Smoker  . Smokeless tobacco: Never Used  . Alcohol use No     Allergies   Review of patient's allergies indicates no known allergies.   Review of Systems Review of Systems  Constitutional: Negative for chills and fever.  HENT: Negative for ear pain and sore throat.   Eyes: Negative for pain and visual disturbance.  Respiratory: Negative for cough and shortness of breath.   Cardiovascular: Negative for chest pain, palpitations and leg swelling.  Gastrointestinal: Positive for abdominal pain (suprapubic abdominal pain) and vomiting. Negative for blood in stool.  Genitourinary: Positive for dysuria and flank pain (right side only). Negative for hematuria, pelvic pain, vaginal bleeding and vaginal discharge.  Musculoskeletal: Negative for back pain and neck pain.  Skin: Negative for color change and rash.  Neurological: Negative for dizziness, seizures, syncope, weakness, numbness and headaches.     Physical Exam Updated Vital Signs BP 106/66   Pulse 80   Temp 97.5 F (36.4 C) (Oral)   Resp 18   Wt 81.6 kg   LMP 06/30/2016   SpO2 100%   BMI 34.86 kg/m  Physical Exam  Constitutional: She appears well-developed and well-nourished. No distress.  Obese female, appears relatively comfortable lying in bed  HENT:  Head: Normocephalic and atraumatic.  Mouth/Throat: Oropharynx is clear and moist.  Eyes: Conjunctivae are normal.  Neck: Normal range of motion.  Cardiovascular: Normal rate, regular rhythm, normal heart sounds and intact distal pulses.   Pulmonary/Chest: Effort normal and breath sounds normal. No respiratory  distress.  Abdominal: Soft. Bowel sounds are normal. She exhibits no distension. There is tenderness (mild suprapubic tenderness). There is no guarding.  Mild CVA tenderness on right side only.  Musculoskeletal: Normal range of motion. She exhibits no edema or tenderness.  Neurological: She is alert.  Skin: Skin is warm and dry.  Psychiatric: She has a normal mood and affect.  Nursing note and vitals reviewed.    ED Treatments / Results  Labs (all labs ordered are listed, but only abnormal results are displayed) Labs Reviewed  URINALYSIS, ROUTINE W REFLEX MICROSCOPIC (NOT AT [facility]) - Abnormal; Notable for the following:       Result Value   Color, Urine ORANGE (*)    APPearance CLOUDY (*)    Glucose, UA >1000 (*)    Ketones, ur 15 (*)    Protein, ur 30 (*)    Nitrite POSITIVE (*)    Leukocytes, UA SMALL (*)    All other components within normal limits  URINE MICROSCOPIC-ADD ON - Abnormal; Notable for the following:    Squamous Epithelial / LPF 6-30 (*)    Bacteria, UA MANY (*)    All other components within normal limits  I-STAT CHEM 8, ED - Abnormal; Notable for the following:    Creatinine, Ser 0.30 (*)    Glucose, Bld 199 (*)    Hemoglobin 11.9 (*)    HCT 35.0 (*)    All other components within normal limits  URINE CULTURE  I-STAT BETA HCG BLOOD, ED ([facility], [facility], [facility] ONLY)    EKG  EKG Interpretation None       Radiology No results found.  Procedures Procedures (including critical care time)  Medications Ordered in ED Medications  sodium chloride 0.9 % bolus 1,000 mL (0 mLs Intravenous Stopped 07/28/16 1500)  ondansetron (ZOFRAN) injection 4 mg (4 mg Intravenous Given 07/28/16 1343)  cefTRIAXone (ROCEPHIN) 1 g in dextrose 5 % 50 mL IVPB (0 g Intravenous Stopped 07/28/16 1500)  HYDROcodone-acetaminophen (NORCO/VICODIN) 5-325 MG per tablet 1 tablet (1 tablet Oral Given 07/28/16 1458)     Initial Impression / Assessment and Plan / ED Course  I have reviewed the  triage vital signs and the nursing notes.  Pertinent labs & imaging results that were available during my care of the patient were reviewed by me and considered in my medical decision making (see chart for details).  Clinical Course   Patient is 30 yo F with PMH of diabetes, Spanish speaking only, presenting with dysuria and suprapubic abdominal pain for 1 week. States she vomited twice yesterday, but appears relatively comfortable, VSS, and only mild suprapubic and right CVA TTP. hCG negative. Chem 8 shows glucose 199 but otherwise unremarkable. Urinalysis positive for nitrite, and sent for culture. Patient stated she was nauseous, and given IVF, Zofran, and 1 g IV Rocephin. On reassessment, patient stated she felt better, but pain increased slightly, and given PO Norco. Tolerated well and stable for d/c home with prescription for Bactrim and Norco, as well as one month supply of glimepiride and metformin. Given instructions to establish care and  f/u with PCP at [facility]. Strict return precautions discussed for new or worsening symptoms including fever, abdominal pain, vomiting, and flank pain.  Final Clinical Impressions(s) / ED Diagnoses   Final diagnoses:  Pyelonephritis    New Prescriptions Discharge Medication List as of 07/28/2016  4:21 PM    START taking these medications   Details  HYDROcodone-acetaminophen (NORCO/VICODIN) 5-325 MG tablet Take 1 tablet by mouth every 6 (six) hours as needed., Starting Fri 07/28/2016, Print    sulfamethoxazole-trimethoprim (BACTRIM DS,SEPTRA DS) 800-160 MG tablet Take 1 tablet by mouth 2 (two) times daily., Starting Fri 07/28/2016, Until Fri 08/11/2016, Print         Dalana Pfahler F de Quinby II, [place] 07/30/16 1610    Margarita Grizzle, MD 07/30/16 2147

## 2016-07-28 NOTE — ED Triage Notes (Signed)
Pt reports dysuria, urinary frequency and mild back pain x 1 week.

## 2016-07-28 NOTE — ED Notes (Signed)
PA at bedside.

## 2016-07-28 NOTE — ED Notes (Signed)
Pt states that she has been experiencing painful urination x1 week now.

## 2016-07-30 LAB — URINE CULTURE

## 2016-07-31 ENCOUNTER — Telehealth (HOSPITAL_BASED_OUTPATIENT_CLINIC_OR_DEPARTMENT_OTHER): Payer: Self-pay | Admitting: Emergency Medicine

## 2016-07-31 NOTE — Telephone Encounter (Signed)
Post ED Visit - Positive Culture Follow-up  Culture report reviewed by antimicrobial stewardship pharmacist:  []  Enzo BiNathan Batchelder, Pharm.D. []  Celedonio MiyamotoJeremy Frens, [address]Ph[address]D., BCPS []  Garvin FilaMike Maccia, Pharm.D. []  Georgina PillionElizabeth Martin, Pharm.D., BCPS []  DeQuincyMinh Pham, [address]Pharm.D., BCPS, AAHIVP []  Estella HuskMichelle Turner, Pharm.D., BCPS, AAHIVP []  Tennis Mustassie Stewart, Pharm.D. []  Sherle Poeob Vincent, VermontPharm.D. Mackie Paienee Ackley PharmD  Positive urine culture Treated with bactrim DS, organism sensitive to the same and no further patient follow-up is required at this time.  Berle MullMiller, Ethie Curless 07/31/2016, 9:25 AM

## 2016-10-25 ENCOUNTER — Ambulatory Visit: Payer: Commercial Managed Care - PPO | Admitting: Internal Medicine

## 2017-04-09 ENCOUNTER — Ambulatory Visit: Payer: Commercial Managed Care - PPO | Attending: Family Medicine | Admitting: Family Medicine

## 2017-04-09 VITALS — BP 104/69 | HR 110 | Temp 98.1°F | Resp 18 | Ht 60.0 in | Wt 162.6 lb

## 2017-04-09 DIAGNOSIS — R1013 Epigastric pain: Secondary | ICD-10-CM | POA: Diagnosis present

## 2017-04-09 DIAGNOSIS — E119 Type 2 diabetes mellitus without complications: Secondary | ICD-10-CM | POA: Insufficient documentation

## 2017-04-09 DIAGNOSIS — R1110 Vomiting, unspecified: Secondary | ICD-10-CM | POA: Insufficient documentation

## 2017-04-09 DIAGNOSIS — E785 Hyperlipidemia, unspecified: Secondary | ICD-10-CM | POA: Diagnosis not present

## 2017-04-09 DIAGNOSIS — K219 Gastro-esophageal reflux disease without esophagitis: Secondary | ICD-10-CM | POA: Diagnosis not present

## 2017-04-09 DIAGNOSIS — Z6830 Body mass index (BMI) 30.0-30.9, adult: Secondary | ICD-10-CM | POA: Diagnosis not present

## 2017-04-09 DIAGNOSIS — Z7984 Long term (current) use of oral hypoglycemic drugs: Secondary | ICD-10-CM | POA: Diagnosis not present

## 2017-04-09 DIAGNOSIS — E1165 Type 2 diabetes mellitus with hyperglycemia: Secondary | ICD-10-CM

## 2017-04-09 DIAGNOSIS — E669 Obesity, unspecified: Secondary | ICD-10-CM | POA: Diagnosis not present

## 2017-04-09 LAB — POCT URINE PREGNANCY: PREG TEST UR: NEGATIVE

## 2017-04-09 LAB — POCT UA - MICROALBUMIN
Albumin/Creatinine Ratio, Urine, POC: <30
CREATININE, POC: 100 mg/dL
MICROALBUMIN (UR) POC: 30 mg/L

## 2017-04-09 LAB — POCT GLYCOSYLATED HEMOGLOBIN (HGB A1C): Hemoglobin A1C: 11.2

## 2017-04-09 LAB — GLUCOSE, POCT (MANUAL RESULT ENTRY)
POC GLUCOSE: 203 mg/dL — AB (ref 70–99)
POC Glucose: 268 mg/dl — AB (ref 70–99)

## 2017-04-09 MED ORDER — GLUCOSE BLOOD VI STRP: ORAL_STRIP | 12 refills | Status: DC

## 2017-04-09 MED ORDER — ACCU-CHEK AVIVA PLUS W/DEVICE KIT: 1.0000 | PACK | Freq: Once | 0 refills | Status: AC

## 2017-04-09 MED ORDER — GLIPIZIDE 5 MG PO TABS: 5.0000 mg | ORAL_TABLET | Freq: Every day | ORAL | 2 refills | Status: DC

## 2017-04-09 MED ORDER — OMEPRAZOLE 20 MG PO CPDR: 20.0000 mg | DELAYED_RELEASE_CAPSULE | Freq: Every day | ORAL | 2 refills | Status: DC

## 2017-04-09 MED ORDER — ACCU-CHEK SOFTCLIX LANCET DEV MISC: 0 refills | Status: DC

## 2017-04-09 MED ORDER — INSULIN ASPART 100 UNIT/ML ~~LOC~~ SOLN
10.0000 [IU] | Freq: Once | SUBCUTANEOUS | Status: AC
  Administered 2017-04-09: 10 [IU] via SUBCUTANEOUS

## 2017-04-09 MED ORDER — METFORMIN HCL ER 500 MG PO TB24: 1000.0000 mg | ORAL_TABLET | Freq: Two times a day (BID) | ORAL | 3 refills | Status: DC

## 2017-04-09 NOTE — Patient Instructions (Addendum)
Schedule appointment in 1 month for pap.     Diabetes mellitus tipo2 en los adultos, cuidados personales (Type 2 Diabetes Mellitus, Self Care, Adult) El cuidado personal despus del diagnstico de diabetes tipo2 (diabetes mellitus tipo2) implica mantener el nivel de glucosa en la sangre bajo control a travs del equilibrio de los siguientes factores:  Nutricin.  Actividad fsica.  Cambios en el estilo de vida.  Medicamentos o insulina, si es necesario.  El apoyo del equipo de mdicos y de Producer, television/film/video. La siguiente informacin explica lo que debe saber para mantener la diabetes bajo control en su casa. QU DEBO SABER PARA MANTENER LA GLUCEMIA BAJO CONTROL?  Contrlese la glucemia todos los [place], con la frecuencia que le haya indicado el mdico.  Comunquese con el mdico si la glucemia est por encima del nivel ideal en 2anlisis seguidos.  Hgase controlar la hemoglobinaA1c al [employer] veces al ao o con la frecuencia que le haya indicado el mdico. El mdico establecer los objetivos personalizados de su tratamiento. Generalmente, el objetivo del tratamiento es [employer] siguientes niveles de glucosa en la sangre:  Antes de las comidas (preprandial): de 80 a 129m/dl (4,4 a 7,261ml/l).  Despus de las comidas (posprandial): por debajo de 18059ml (51m53ml).  Nivel de A1c: menos del 7%. QU DEBO SABER SOBRE LA HIPECalimesa es la hiperglucemia? La hiperglucemia, tambin llamada glucemia alta, ocurre cuando el nivel de glucosa en la sangre es muy Chicogrese de conoRyland Groupnos tempranos de hiperglucemia, por ejemplo:  Aumento de la sed.  Hambre.  Mucho cansancio.  Necesidad de orinGarment/textile technologist mayor frecuencia que lo habitual.  Visin borrosa. Qu es la hipoglucemia? La hipoglucemia, tambin llamada glucemia baja, ocurre cuando el nivel de glucosa en la sangre es igual o menor que 70mg35m(3,9mmol46m. El riesgo de  hipoglucemia aumenta durante o despus de realizOptometristidad fsica, mientras duerme, cuando est enfermo o si se saltea comidas o no come durantTech Data Corporationa). Es importPhotographerntomas de la hipoglucemia y tratarla de inmediato. Lleve siempre consigo una colacin de 15gramos hidratos de carbono de accin rpida para tratar la glucemia baja. Los familiares y los amigos cercanos tambin deben conoceRyland Groupas, y comprender cmo tratar la hipoglucemia, en caso de que usted no pueda tratarse a s mismo. Cules son los sntomas de la hipoglucemia? Los sntomas de hipoglucemia pueden incluir los siguientes:  HambreBlue Ashiedad.  Sudoracin y piel hmeda.  Confusin.  Mareos o sensacin de desvanecimiento.  Somnolencia.  Nuseas.  Aumento de la frecuencia cardaca.  Dolor de cabezaNetherlandsin borrosa.  Convulsiones.  Pesadillas.  Hormigueo o adormecimiento alrededor de la Exxon Mobil Corporationlabios o la lenguaBulls Gapbios en el habla.  Disminucin de la capacidad de concentracin.  Cambios en la coordinacin.  Sueo agitado.  Temblores o sacudidas.  Desmayos.  Irritabilidad. Cmo se trata la hipoglucemia? Si est alerta y puede tragar con seguridad, siga la regla de 15/15, que consiste en lo siguiente:  Tome 15gramos de hidratos de carbono de accin rpida. Las opciones de accin rpida incluyen lo siguiente: ? 1pomo de glucosa en gel. ? 3comprimidos de glucosa. ? 6 a 8unidades de caramelos duros. ? 4onzas (120ml) 56mugo de frutas. ? 4onzas (120ml) d74mseosa comn (no diettica).  Contrlese la glucemia 15minuto63mspus de ingerir el hidrato de carbono.  Si este nuevo nivel de glucosa en la sangre an es de 70mg/dl o67mor (3,9Garment/textile technologistl), 25mlva a ingerir 15gramos de  un hidrato de carbono.  Si la glucemia no aumenta por encima de '70mg'$ /dl (3,31mol/l) despus de 3intentos, solicite ayuda mdica de emergencia.  Ingiera una comida o una  colacin en el transcurso de 1hora despus de que la glucemia se haya normalizado. Cmo se trata la hipoglucemia grave? La hipoglucemia grave ocurre cuando la glucemia es igual o menor que '54mg'$ /dl (357ml/l). La hipoglucemia grave es unEngineer, maintenance (IT)No espere hasta que los sntomas desaparezcan. Solicite atencin mdica de inmediato. Comunquese con el servicio de emergencias de su localidad (911 en los [place]). No conduzca por sus propios medios haPrincipal FinancialSi tiene hipoglucemia grave y no puede ingerir alimentos o bebidas, tal vez deba aplicarse una inyeccin de glucagn. Un familiar o un amigo cercano deben aprender a controlarle la glucemia y a aplicarle una inyeccin de glucagn. Pregntele al mdico si debe tener disponible un kit de inyecciones de glucagn de emFreight forwarderEs posible que la hipoglucemia grave deba tratarse en un hospital. El tratamiento puede incluir la administracin de glucosa a travs de una va intravenosa (IV). Tambin puede necesitar un tratamiento para tratar la afeccin que est causando la hipoglucemia. TENER DIABETES PUEDE PONERME EN RIESGO DE SUFRIR OTRAS AFECCIONES? Tener diabetes puede ponerlo en riesgo de sufrir otras afecciones a largo plazo (crnicas), como cardiopata coronaria y enfermedades renales. El mdico puede recetar medicamentos para evitar las complicaciones causadas por la diabetes. Estos medicamentos pueden incluir los siguientes:  Aspirina.  Medicamentos para baAdvertising copywriter Medicamentos para controlar la presin arterial. QU OTRAS COSAS PUEDO HACER PARA CONTROLAR LA DIABETES? Tome los medicamentos para la diabetes como se lo hayan indicado  Si el mdico le recet insulina o medicamentos para la diabetes, tmelos todos loSanta Cruz No se quede sin insulina ni cualquier otro medicamento para la diabetes que tome. Planifique con antelacin para tenerlos siempre a su disposicin.  Si usCanadansulina, ajuste las dosis en  funcin de la cantidad de actividad fsica que realiza y de los alimentos que consume. El mdico le indicar cmo ajustar las dosis. Opte por opciones de alimentos saludables Lo que come y bebe incide en la glucemia y las dosis de inEl CampoHacer buenas elecciones ayuda a mantener la diabetes bajo control y a evTax inspectore salud. Un plan de alimentacin saludable incluye consumir protenas magras, hidratos de carbono complejos, frutas y verduras frescas, productos lcteos con bajo contenido de grDjibouti grasas saludables. Programe una cita con un especialista en alimentacin y nutricin (nutricionista certificado) para que lo ayude a arJournalist, newspapere alimentacin adecuado para usted. Asegrese de lo siguiente:  Siga las indicaciones del mdico respecto de las restricciones para las comidas o las bebidas.  Beba suficiente lquido para maConsulting civil engineerrina clara o de color amarillo plido.  Ingiera colaciones saludables entre comidas nutritivas.  Haga un seguimiento de los hidratos de carbono que consume. Para hacerlo, lea las etiquetas de informacin nutricional y aprenda cules son los tamaos de las porciones estndar de los alimentos.  Siga el plan para los das de enfermedad cuando no pueda comer o beber normalmente. Arme este plan por adelantado con el mdico. Mantngase activo Haga actividad fsica habitualmente como se lo haya indicado el mdico. Esto puede incluir lo siguiente:  ReOptometristjercicios de elongacin y de fortalecimiento, como yoga o levantamiento de pesas, por lo menos 2veces por semana.  Hacer por lo menos 1503mtos semanales de ejercicios de intensidad moderada o alta. Estos podran ser caminatas dinmicas, ciclismo o gimnasia  acutica. ? Reparta la actividad en al menos 3das de la semana. ? No deje pasar ms de 2das seguidos sin hacer algn tipo de actividad fsica. Cuando comience un ejercicio o una actividad nuevos, trabaje con el mdico para ajustar la  [place], los medicamentos o la ingesta de comidas segn sea necesario. Opte por un estilo de vida saludable  No consuma ningn producto que contenga tabaco, lo que incluye cigarrillos, tabaco de Higher education careers adviser y Psychologist, sport and exercise. Si necesita ayuda para dejar de fumar, consulte al mdico.  Si el mdico afirma que no corre riesgos al beber alcohol, limite su consumo a no ms de 65mdida por da si es mujer y no est eNarrows[place] y 239midas si es hombre. Una medida equivale a 12onzas de cerveza, 5onzas de vino o 1onzas de bebidas alcohlicas de alta graduacin.  Aprenda a maEngineer, maintenance (IT)Si necesita ayuda para lograrlo, consulte a su mdico. Cuide su cuerpo  Mantngase al da con las vacunas. Adems de aplicarse las vacunas como se lo haya indicado el mdico, es recomendable que se vacune contra las siguientes enfermedades: ? La gripe (influenza). Colquese la vacuna antigripal todos los aoFranklin Lakes? [place]. ? HepatitisB.  Programe un examen ocular inmediatamente despus del diagnstico y luego una vez por ao.  Examnese la piel y los pies en busca de cortes, moretones, enrojecimiento, ampollas o llagas. Programe una cita para que el mdViacomontrole los pies una vez por ao.  Cepllese los dientes y laNorth Catasauqua use hilo dental al menos una vez por da. Visite al dentista al menos una vez cada 67m59ms.  Mantenga un peso saludable. Instrucciones generales  TomDelphi venta libre y los recetados solamente como se lo haya indicado el mdico.  Comparta su plan de control de la diabetes con sus compaeros de trabajo y de la Cytogeneticist con las personas con las que conHortonvilleContrlese las cetonas en la orina cuando est enfermo y como se lo haya indicado el mdico.  Pregntele al mdico: ? Debo reunirme con un Radio broadcast assistantra el cuidado de la diabetes? ? Dnde puedo encontrar un grupo de apoyo para personas diabticas?  Lleve una tarjeta de alerta  mdica o use un brazalete o medalla de alerta mdica.  Concurra a todas las visitas de control como se lo haya indicado el mdico. Esto es importante. DNDE ENCONTRAR MS INFORMACIN: Para obtener ms informacin sobre la diabetes, visite los siguientes sitios:  Asociacin Americana de la Diabetes (American Diabetes Association, ADA): www.diabetes.org  Asociacin Norteamericana de Instructores para el Cuidado de la Diabetes (American Association of Diabetes Educators, AADE): www.diabeteseducator.org/patient-resources Esta informacin no tiene comMarine scientist consejo del mdico. Asegrese de hacerle al mdico cualquier pregunta que tenga. Document Released: 01/24/2016 Document Revised: 01/24/2016 Document Reviewed: 11/05/2015 Elsevier Interactive Patient Education  2017 [employer]

## 2017-04-09 NOTE — Progress Notes (Signed)
Subjective:  Patient ID: Brenda Greer, female    DOB: 04-03-1986  Age: 31 y.o. MRN: [id number]  CC: Diabetes   HPI Brenda Greer is a 31 y.o.female who presents for  here for an initial evaluation of type 2 diabetes mellitus and abdominal complaint. Interpreter services used. She reports epigastric pain, heartburn, and episodes of vomiting for 2 months. Denies any changes to bowel or bladder habits, or poor appetite. Reports taking alka seltzer with minimal relief of symptoms. She reports a history of diabetes for 13 years. Current symptoms/problems include none. She denies any paresthesias of the feet, visual disturbances, or ulcerations.   Known diabetic complications: none Cardiovascular risk factors: diabetes mellitus, dyslipidemia, obesity (BMI >= 30 kg/m2) and sedentary lifestyle Current diabetic medications include oral agents (dual therapy): Glucophage XR, glimperide. She reports being without her diabetic medications for 1 month.   Eye exam current (within one year): no Weight trend: stable Prior visit with dietician: in the past. Current diet: regular Current exercise: none  Current monitoring regimen: none.  Home blood sugar records: N/A Any episodes of hypoglycemia? no  Is She on ACE inhibitor or angiotensin II receptor blocker?  No    Outpatient Medications Prior to Visit  Medication Sig Dispense Refill  . Multiple Vitamin (MULTIVITAMIN WITH MINERALS) TABS Take 1 tablet by mouth daily.    Marland Kitchen glimepiride (AMARYL) 4 MG tablet Take 1 tablet (4 mg total) by mouth daily before breakfast. 30 tablet 3  . metFORMIN (GLUCOPHAGE XR) 500 MG 24 hr tablet Take 2 tablets (1,000 mg total) by mouth 2 (two) times daily with a meal. 120 tablet 3  . HYDROcodone-acetaminophen (NORCO/VICODIN) 5-325 MG tablet Take 1 tablet by mouth every 6 (six) hours as needed. 10 tablet 0  . ondansetron (ZOFRAN ODT) 8 MG disintegrating tablet Take 1 tablet (8 mg total) by mouth every  8 (eight) hours as needed for nausea. (Patient not taking: Reported on 07/28/2016) 20 tablet 0   No facility-administered medications prior to visit.     ROS Review of Systems  Constitutional: Negative for appetite change, fever and unexpected weight change.  HENT: Negative.   Eyes: Negative.   Respiratory: Negative.   Cardiovascular: Negative.   Gastrointestinal: Positive for abdominal pain, nausea and vomiting.       Heartburn  Endocrine: Negative.   Skin: Negative.     Objective:  BP 104/69 (BP Location: Left Arm, Patient Position: Sitting, Cuff Size: Normal)   Pulse (!) 110   Temp 98.1 F (36.7 C) (Oral)   Resp 18   Ht 5' (1.524 m)   Wt 162 lb 9.6 oz (73.8 kg)   SpO2 97%   BMI 31.76 kg/m   BP/Weight 04/09/2017 33/29/5188 01/14/6605  Systolic BP 301 601 093  Diastolic BP 69 83 74  Wt. (Lbs) 162.6 180 172.4  BMI 31.76 34.86 33.41     Physical Exam  Constitutional: She appears well-developed and well-nourished.  HENT:  Head: Normocephalic and atraumatic.  Right Ear: External ear normal.  Left Ear: External ear normal.  Nose: Nose normal.  Mouth/Throat: Oropharynx is clear and moist.  Eyes: Conjunctivae are normal. Pupils are equal, round, and reactive to light.  Neck: Normal range of motion. Neck supple. No thyromegaly present.  Cardiovascular: Normal rate, regular rhythm, normal heart sounds and intact distal pulses.   Pulmonary/Chest: Effort normal and breath sounds normal.  Abdominal: Soft. Bowel sounds are normal. There is tenderness (epigastric ).  Lymphadenopathy:    She has no cervical  adenopathy.  Skin: Skin is warm and dry.  Psychiatric: She has a normal mood and affect.  Nursing note and vitals reviewed.  Diabetic Foot Exam - Simple   Simple Foot Form Diabetic Foot exam was performed with the following findings:  Yes 04/09/2017  2:49 PM  Visual Inspection No deformities, no ulcerations, no other skin breakdown bilaterally:  Yes See comments:   Yes Sensation Testing Intact to touch and monofilament testing bilaterally:  Yes Pulse Check Posterior Tibialis and Dorsalis pulse intact bilaterally:  Yes Comments Thickened brittle bilateral toenails.       Assessment & Plan:   Problem List Items Addressed This Visit    None    Visit Diagnoses    Uncontrolled type 2 diabetes mellitus with hyperglycemia, without long-term current use of insulin ([place])    -  Primary   Diabetic supplies ordered.   Follow up with clinical pharmacist for glucometer and DM setup    Follow up with PCP in 4 weeks for DM follow up.   Relevant Medications   insulin aspart (novoLOG) injection 10 Units (Completed)   metFORMIN (GLUCOPHAGE XR) 500 MG 24 hr tablet   glipiZIDE (GLUCOTROL) 5 MG tablet   glucose blood test strip   Lancet Devices (ACCU-CHEK SOFTCLIX) lancets   Other Relevant Orders   Glucose (CBG) (Completed)   HgB A1c (Completed)   CMP14+EGFR (Completed)   Lipid Panel (Completed)   POCT UA - Microalbumin (Completed)   Ambulatory referral to Ophthalmology   Glucose (CBG) (Completed)   Epigastric abdominal pain       Relevant Orders   CMP14+EGFR (Completed)   Lipid Panel (Completed)   POCT urine pregnancy (Completed)   H. pylori breath test   Gastroesophageal reflux disease without esophagitis       Relevant Medications   omeprazole (PRILOSEC) 20 MG capsule   Other Relevant Orders   H. pylori breath test      Meds ordered this encounter  Medications  . insulin aspart (novoLOG) injection 10 Units  . metFORMIN (GLUCOPHAGE XR) 500 MG 24 hr tablet    Sig: Take 2 tablets (1,000 mg total) by mouth 2 (two) times daily with a meal.    Dispense:  120 tablet    Refill:  3    Spanish instructions    Order Specific Question:   Supervising Provider    AnswerTresa Garter [id number]  . glipiZIDE (GLUCOTROL) 5 MG tablet    Sig: Take 1 tablet (5 mg total) by mouth daily before breakfast.    Dispense:  30 tablet    Refill:  2     Spanish instructions.    Order Specific Question:   Supervising Provider    Answer:   Tresa Garter [id number]  . glucose blood test strip    Sig: Use as instructed    Dispense:  100 each    Refill:  12    Order Specific Question:   Supervising Provider    Answer:   Tresa Garter [[id number]]  . Blood Glucose Monitoring Suppl (ACCU-CHEK AVIVA PLUS) w/Device KIT    Sig: 1 kit by Does not apply route once.    Dispense:  1 kit    Refill:  0    Order Specific Question:   Supervising Provider    Answer:   Tresa Garter [id number]  . Lancet Devices (ACCU-CHEK SOFTCLIX) lancets    Sig: Use as instructed    Dispense:  1 each  Refill:  0    Order Specific Question:   Supervising Provider    Answer:   Tresa Garter [id number]  . omeprazole (PRILOSEC) 20 MG capsule    Sig: Take 1 capsule (20 mg total) by mouth daily.    Dispense:  30 capsule    Refill:  2    Order Specific Question:   Supervising Provider    Answer:   Tresa Garter [[id number]]    Follow-up: Return in about 2 days (around 04/11/2017) for Glucometer with clinical pharmacist.   Alfonse Spruce FNP

## 2017-04-09 NOTE — Progress Notes (Signed)
Patient is here for establish care  Patient is here for f/up

## 2017-04-10 LAB — LIPID PANEL
CHOLESTEROL TOTAL: 202 mg/dL — AB (ref 100–199)
Chol/HDL Ratio: 3.5 ratio (ref 0.0–4.4)
HDL: 57 mg/dL (ref >39)
LDL Calculated: 112 mg/dL — ABNORMAL HIGH (ref 0–99)
TRIGLYCERIDES: 164 mg/dL — AB (ref 0–149)
VLDL Cholesterol Cal: 33 mg/dL (ref 5–40)

## 2017-04-10 LAB — CMP14+EGFR
A/G RATIO: 1.6 (ref 1.2–2.2)
ALK PHOS: 58 IU/L (ref 39–117)
ALT: 31 IU/L (ref 0–32)
AST: 26 IU/L (ref 0–40)
Albumin: 4.2 g/dL (ref 3.5–5.5)
BUN/Creatinine Ratio: 16 (ref 9–23)
BUN: 8 mg/dL (ref 6–20)
CHLORIDE: 103 mmol/L (ref 96–106)
CO2: 20 mmol/L (ref 20–29)
Calcium: 9.3 mg/dL (ref 8.7–10.2)
Creatinine, Ser: 0.49 mg/dL — ABNORMAL LOW (ref 0.57–1.00)
GFR calc non Af Amer: 130 mL/min/{1.73_m2} (ref >59)
GFR, EST AFRICAN AMERICAN: 150 mL/min/{1.73_m2} (ref >59)
Globulin, Total: 2.6 g/dL (ref 1.5–4.5)
Glucose: 192 mg/dL — ABNORMAL HIGH (ref 65–99)
POTASSIUM: 4 mmol/L (ref 3.5–5.2)
Sodium: 137 mmol/L (ref 134–144)
Total Protein: 6.8 g/dL (ref 6.0–8.5)

## 2017-04-11 ENCOUNTER — Ambulatory Visit: Payer: Commercial Managed Care - PPO | Admitting: Pharmacist

## 2017-04-11 LAB — H. PYLORI BREATH TEST: H pylori Breath Test: POSITIVE — AB

## 2017-04-13 ENCOUNTER — Telehealth: Payer: Self-pay

## 2017-04-13 ENCOUNTER — Other Ambulatory Visit: Payer: Self-pay | Admitting: Family Medicine

## 2017-04-13 DIAGNOSIS — A048 Other specified bacterial intestinal infections: Secondary | ICD-10-CM

## 2017-04-13 MED ORDER — AMOXICILLIN 500 MG PO TABS: 1000.0000 mg | ORAL_TABLET | Freq: Two times a day (BID) | ORAL | 0 refills | Status: DC

## 2017-04-13 MED ORDER — PRAVASTATIN SODIUM 20 MG PO TABS: 20.0000 mg | ORAL_TABLET | Freq: Every day | ORAL | 2 refills | Status: DC

## 2017-04-13 MED ORDER — CLARITHROMYCIN 500 MG PO TABS: 500.0000 mg | ORAL_TABLET | Freq: Two times a day (BID) | ORAL | 0 refills | Status: DC

## 2017-04-13 NOTE — Telephone Encounter (Signed)
CMA call regarding lab results  Patient did not answer but left a detailed message per DPR if have any questions just to call back  

## 2017-04-13 NOTE — Telephone Encounter (Signed)
-----   Message from Lizbeth BarkMandesia R Hairston, FNP sent at 04/13/2017  8:33 AM EDT ----- -H.pylori is positive. H.pylori is a bacteria that can infect the stomach and cause stomach ulcers. It can also cause acid reflux. You will be prescribed antibiotics and a course of omeprazole to treat for 14 days. While taking your course of antibiotics, take your omeprazole twice a day. -Recommend following up in 6 week for H.pylori lab recheck. -Lipid levels were elevated. This can increase your risk of heart disease. You will be prescribed pravastatin to help lower risk. Recommend follow up in 3 months. Kidney function normal Liver function normal Labs normal

## 2017-04-16 ENCOUNTER — Other Ambulatory Visit: Payer: Self-pay | Admitting: Pharmacist

## 2017-04-16 MED ORDER — CLARITHROMYCIN 500 MG PO TABS: 500.0000 mg | ORAL_TABLET | Freq: Two times a day (BID) | ORAL | 0 refills | Status: DC

## 2017-04-16 MED ORDER — PRAVASTATIN SODIUM 20 MG PO TABS: 20.0000 mg | ORAL_TABLET | Freq: Every day | ORAL | 2 refills | Status: DC

## 2017-04-16 MED ORDER — AMOXICILLIN 500 MG PO TABS: 1000.0000 mg | ORAL_TABLET | Freq: Two times a day (BID) | ORAL | 0 refills | Status: DC

## 2017-04-16 MED ORDER — AMOXICILLIN 500 MG PO CAPS: 1000.0000 mg | ORAL_CAPSULE | Freq: Two times a day (BID) | ORAL | 0 refills | Status: DC

## 2017-04-16 NOTE — Telephone Encounter (Signed)
Prescriptions sent to [facility].

## 2017-04-16 NOTE — Telephone Encounter (Signed)
Pt. Called requesting that the Rx that were prescribed to her on 04/13/17 are sent to [facility] pharmacy on [address]. Please f/u

## 2017-04-17 ENCOUNTER — Ambulatory Visit: Payer: Commercial Managed Care - PPO | Attending: Family Medicine | Admitting: Pharmacist

## 2017-04-17 DIAGNOSIS — E1165 Type 2 diabetes mellitus with hyperglycemia: Secondary | ICD-10-CM

## 2017-04-17 DIAGNOSIS — E119 Type 2 diabetes mellitus without complications: Secondary | ICD-10-CM | POA: Diagnosis present

## 2017-04-17 MED ORDER — GLUCOSE BLOOD VI STRP: ORAL_STRIP | 12 refills | Status: DC

## 2017-04-17 NOTE — Progress Notes (Signed)
    S:     No chief complaint on file.   Patient arrives in goods spirits.  Presents for diabetes blood glucose meter education at the request of Arrie SenateMandesia Hairston. Patient was referred on 04/09/17.  Patient was last seen by Primary Care Provider on 04/09/17.  Spanish interpreter [phone] was used for the entirety of the visit.  Patient denies needing education on glucometer. She has two at home from when she was pregnant and had to check her blood sugar. She would prefer to use one of those, whichever has the cheapest strips.    O:  Physical Exam   ROS   Lab Results  Component Value Date   HGBA1C 11.2 04/09/2017   There were no vitals filed for this visit.  A/P: Diabetes longstanding currently uncontrolled based on A1c of 11.2.   Education not needed. Strips for both meters were ordered and sent to [facility] as I do not know which would be the cheapest. Patient denies any questions on how to check her blood glucose at home.  Next A1C anticipated September 2018.    Written patient instructions provided.  Total time in face to face counseling 10 minutes.   [facility]

## 2017-05-07 ENCOUNTER — Ambulatory Visit: Payer: Commercial Managed Care - PPO | Attending: Family Medicine | Admitting: Family Medicine

## 2017-05-07 ENCOUNTER — Other Ambulatory Visit (HOSPITAL_COMMUNITY)
Admission: RE | Admit: 2017-05-07 | Discharge: 2017-05-07 | Disposition: A | Discharge status: Home / Self Care | Payer: Commercial Managed Care - PPO | Source: Ambulatory Visit | Attending: Family Medicine | Admitting: Family Medicine

## 2017-05-07 ENCOUNTER — Encounter: Payer: Self-pay | Admitting: Family Medicine

## 2017-05-07 VITALS — BP 114/76 | HR 99 | Temp 98.1°F | Resp 18 | Ht 60.0 in | Wt 160.4 lb

## 2017-05-07 DIAGNOSIS — Z79899 Other long term (current) drug therapy: Secondary | ICD-10-CM | POA: Diagnosis not present

## 2017-05-07 DIAGNOSIS — Z7984 Long term (current) use of oral hypoglycemic drugs: Secondary | ICD-10-CM | POA: Diagnosis not present

## 2017-05-07 DIAGNOSIS — Z8619 Personal history of other infectious and parasitic diseases: Secondary | ICD-10-CM | POA: Diagnosis not present

## 2017-05-07 DIAGNOSIS — E1165 Type 2 diabetes mellitus with hyperglycemia: Secondary | ICD-10-CM | POA: Diagnosis not present

## 2017-05-07 DIAGNOSIS — E119 Type 2 diabetes mellitus without complications: Secondary | ICD-10-CM | POA: Insufficient documentation

## 2017-05-07 DIAGNOSIS — Z01419 Encounter for gynecological examination (general) (routine) without abnormal findings: Secondary | ICD-10-CM | POA: Diagnosis not present

## 2017-05-07 LAB — GLUCOSE, POCT (MANUAL RESULT ENTRY)
POC Glucose: 323 mg/dl — AB (ref 70–99)
POC Glucose: 292 mg/dl — AB (ref 70–99)
POC Glucose: 307 mg/dl — AB (ref 70–99)

## 2017-05-07 MED ORDER — GLIPIZIDE 5 MG PO TABS: 5.0000 mg | ORAL_TABLET | Freq: Two times a day (BID) | ORAL | 2 refills | Status: DC

## 2017-05-07 MED ORDER — INSULIN ASPART 100 UNIT/ML ~~LOC~~ SOLN
10.0000 [IU] | Freq: Once | SUBCUTANEOUS | Status: AC
  Administered 2017-05-07: 10 [IU] via SUBCUTANEOUS

## 2017-05-07 MED ORDER — INSULIN ASPART 100 UNIT/ML ~~LOC~~ SOLN
5.0000 [IU] | Freq: Once | SUBCUTANEOUS | Status: AC
  Administered 2017-05-07: 5 [IU] via SUBCUTANEOUS

## 2017-05-07 NOTE — Progress Notes (Signed)
Subjective:  Patient ID: Brenda Greer, female    DOB: December 18, 1985  Age: 31 y.o. MRN: [id number]  CC: Diabetes and Gynecologic Exam   HPI Brenda Greer presents for presents for well woman visit with gynecological examination. She denies any family history breast or cervical cancer.  She denies family history of breast cancer She denies any lumps, nipple discharge, denting or dimpling of the breast. She does perform monthly SBE. She is a non-smoker.She declinesSTI testing with examination. She denies any vaginal lesions, discharge,or dysuria. History of diabetes with hyperglycemia. Home sugars: BGs range between 200's am and 200's pm. Treatment to date: metformin and glipizide. She denies exercise or adhering to lower carbohydrate diet. History of h.pylori infection she reports completing treatment course.   Outpatient Medications Prior to Visit  Medication Sig Dispense Refill  . amoxicillin (AMOXIL) 500 MG capsule Take 2 capsules (1,000 mg total) by mouth 2 (two) times daily. 56 capsule 0  . clarithromycin (BIAXIN) 500 MG tablet Take 1 tablet (500 mg total) by mouth 2 (two) times daily. 28 tablet 0  . glucose blood (TRUETRACK TEST) test strip Use as instructed for daily testing of blood glucose. E11.9 100 each 12  . glucose blood (WAVESENSE PRESTO) test strip Use as instructed for daily testing of blood glucose. E11.9 100 each 12  . HYDROcodone-acetaminophen (NORCO/VICODIN) 5-325 MG tablet Take 1 tablet by mouth every 6 (six) hours as needed. 10 tablet 0  . Lancet Devices (ACCU-CHEK SOFTCLIX) lancets Use as instructed 1 each 0  . metFORMIN (GLUCOPHAGE XR) 500 MG 24 hr tablet Take 2 tablets (1,000 mg total) by mouth 2 (two) times daily with a meal. 120 tablet 3  . Multiple Vitamin (MULTIVITAMIN WITH MINERALS) TABS Take 1 tablet by mouth daily.    Marland Kitchen. omeprazole (PRILOSEC) 20 MG capsule Take 1 capsule (20 mg total) by mouth daily. 30 capsule 2  . ondansetron (ZOFRAN ODT) 8  MG disintegrating tablet Take 1 tablet (8 mg total) by mouth every 8 (eight) hours as needed for nausea. 20 tablet 0  . pravastatin (PRAVACHOL) 20 MG tablet Take 1 tablet (20 mg total) by mouth daily. 30 tablet 2  . glipiZIDE (GLUCOTROL) 5 MG tablet Take 1 tablet (5 mg total) by mouth daily before breakfast. 30 tablet 2   No facility-administered medications prior to visit.     ROS Review of Systems  Constitutional: Negative.   Respiratory: Negative.   Cardiovascular: Negative.   Gastrointestinal: Negative.   Endocrine: Negative.   Genitourinary: Negative.   Skin: Negative.   Psychiatric/Behavioral: Negative.     Objective:  BP 114/76 (BP Location: Left Arm, Patient Position: Sitting, Cuff Size: Normal)   Pulse 99   Temp 98.1 F (36.7 C) (Oral)   Resp 18   Ht 5' (1.524 m)   Wt 160 lb 6.4 oz (72.8 kg)   SpO2 100%   BMI 31.33 kg/m   BP/Weight 05/07/2017 04/09/2017 07/28/2016  Systolic BP 114 104 115  Diastolic BP 76 69 83  Wt. (Lbs) 160.4 162.6 180  BMI 31.33 31.76 34.86    Physical Exam  Constitutional: She appears well-developed and well-nourished.  Eyes: Pupils are equal, round, and reactive to light. Conjunctivae are normal.  Neck: No JVD present.  Cardiovascular: Normal rate, regular rhythm, normal heart sounds and intact distal pulses.   Pulmonary/Chest: Effort normal and breath sounds normal. Right breast exhibits no mass, no nipple discharge, no skin change and no tenderness. Left breast exhibits no mass, no nipple discharge,  no skin change and no tenderness.  Abdominal: Soft. Bowel sounds are normal. There is no tenderness.  Genitourinary: Cervix exhibits no discharge. No vaginal discharge found.  Skin: Skin is warm and dry.  Nursing note and vitals reviewed.   Assessment & Plan:   Problem List Items Addressed This Visit      Endocrine   Diabetes mellitus without complication (HCC)   Glipizide added for better glucose control.         Follow up with  clinical pharmacist in 2 weeks.    Follow up with PCP in 3 months.    Relevant Medications   insulin aspart (novoLOG) injection 10 Units (Completed)   glipiZIDE (GLUCOTROL) 5 MG tablet   insulin aspart (novoLOG) injection 5 Units (Completed)    Other Visit Diagnoses    Well woman exam with routine gynecological exam    -  Primary   Relevant Orders   Cytology - PAP [place] (Completed)   History of Helicobacter pylori infection       Relevant Orders   H. pylori breath test      Meds ordered this encounter  Medications  . insulin aspart (novoLOG) injection 10 Units  . glipiZIDE (GLUCOTROL) 5 MG tablet    Sig: Take 1 tablet (5 mg total) by mouth 2 (two) times daily before a meal.    Dispense:  60 tablet    Refill:  2    Spanish instructions.    Order Specific Question:   Supervising Provider    Answer:   Quentin Angst [id number]  . insulin aspart (novoLOG) injection 5 Units    Follow-up: Return in about 2 weeks (around 05/21/2017) for DM check with Brenda .   Lizbeth Bark FNP

## 2017-05-07 NOTE — Patient Instructions (Addendum)
You will be called with your labs results. Schedule lab appointment for repeat h.pylori testing in 2 weeks.   Autoexamen de [employer] (Breast Self-Awareness) Autoexaminarse las mamas significa familiarizarse con el aspecto y la sensacin de las mamas al tacto. Incluye revisarse las mamas habitualmente e informarle al mdico acerca de cualquier cambio. Es importante autoexaminarse las [place]. Un cambio en las mamas puede ser un signo de un problema mdico grave. Familiarizarse con el aspecto de las mamas y con cmo se sienten al tacto Product/process development scientist los problemas rpidamente, cuando es ms probable que el tratamiento resulte eficaz. Todas las mujeres deben autoexaminarse las [place], incluso aquellas que se sometieron a implantes mamarios. CMO REALIZAR EL AUTOEXAMEN DE MAMAS Una forma de aprender qu es normal para sus mamas y si sufren modificaciones es Radio producer un autoexamen. Para hacer un autoexamen de las mamas: Busque cambios 1. Qutese toda la ropa por encima de la cintura. 2. Prese frente a un espejo en una habitacin con buena iluminacin. 3. Apoye las manos en las caderas. 4. Empuje con fuerza hacia abajo con las manos. 5. Compare las mamas en el espejo. Busque diferencias entre ellas (asimetra), por ejemplo:  Diferencias en la forma.  Diferencias en el tamao.  Pliegues, depresiones y ndulos en Deborha Payment, y no en la otra. 1. Observe cada mama para buscar cambios en la piel, por ejemplo:  Enrojecimiento.  Zonas escamosas. 1. Observe si hay cambios en los pezones, por ejemplo:  Secrecin.  Hemorragia.  Hoyuelos.  Enrojecimiento.  Un cambio en la posicin. Plpese para detectar si hay cambios Plpese las mamas con cuidado para detectar ndulos y [place]. Lo mejor es hacerlo mientras est acostada boca arriba en el piso y nuevamente mientras est sentada o de pie en la ducha o la baera con agua jabonosa en la piel. Plpese cada mama de la siguiente forma:  Coloque el  brazo del lado de la mama que se examina por arriba de la cabeza.  Plpese la mama con la [place].  Comience en la zona del pezn y haga crculos superpuestos de de pulgada (2cm). Para hacerlo, use las yemas de los tres dedos del [place]. Ejerza una presin [place], luego mediana y [place]. La presin Location manager el tejido ms cercano a la piel. La presin mediana le permitir palpar el tejido que est un poco ms profundo. La presin Visual merchandiser el tejido ms cercano a las costillas.  Continuar superponiendo crculos y vaya hacia abajo, hasta sentir las [place], por debajo del [place].  Desplcese a una distancia del ancho de un dedo hacia el centro del cuerpo. Siga con los crculos superpuestos de de pulgada (2cm) para palpar la mama, mientras asciende lentamente hacia la clavcula.  Contine con el examen hacia arriba y [place] abajo con las tres presiones, Civil Service fast streamer a Management consultant. Anote sus hallazgos Anote qu es normal para cada mama y los cambios que encuentre. Debe llevar un registro escrito con los cambios o los hallazgos normales que encuentre para cada seno. Si registra esta informacin, no tiene Primary school teacher solo de la memoria para Designer, industrial/product, la sensibilidad o la ubicacin de los [place]. Anote en qu momento se encuentra del ciclo menstrual, si usted todava est menstruando. Si tiene dificultad para Corporate investment banker, no se desaliente. Con el tiempo, se familiarizar con las variaciones de las mamas y se sentir ms cmoda con Lobbyist. CON QU FRECUENCIA DEBO EXAMINARME LAS MAMAS? Salena Saner  las [employer]. Si est amamantando, el mejor momento para examinarse las [address] es despus de Museum/gallery exhibitions officer o de usar un Regulatory affairs officer. Si menstra, el mejor momento para examinarse las [place] es 5 a 7das despus de finalizado el perodo menstrual. Durante la [place], las mamas estn abultadas, y tal vez sea ms difcil percibir  los [place]. CUNDO DEBO CONTACTAR A MI MDICO? Consulte al mdico si percibe lo siguiente:  Un cambio en la forma o el tamao de las [address] o los pezones.  Un cambio en la piel de las mamas o los pezones, como la piel enrojecida o escamosa.  Una secrecin anormal en los pezones.  Un ndulo o una zona engrosada que no tena antes.  Dolor de [place].  Cualquier cosa que la preocupe. Esta informacin no tiene Theme park manager el consejo del mdico. Asegrese de hacerle al mdico cualquier pregunta que tenga. Document Released: 10/02/2005 Document Revised: 01/24/2016 Document Reviewed: 08/22/2015 Elsevier Interactive Patient Education  2018 [employer].   Prueba de Papanicolaou (Pap Test) POR QU ME DEBO REALIZAR ESTA PRUEBA? A esta prueba tambin se la denomina "frotis de Pap". Es una prueba de deteccin que se [place] para Engineer, manufacturing signos de cncer de vagina, cuello del tero y tero. La prueba tambin puede identificar la presencia de infeccin o cambios precancerosos. El mdico probablemente le recomiende que se realice esta prueba en forma regular. Esta prueba puede realizarse de la siguiente manera:  Cada 3 aos, a partir de los [address].  Cada 5 aos, en combinacin con las pruebas que se realizan para Landscape architect presencia del virus del Geneticist, molecular (VPH).  Con mayor o menor frecuencia, en funcin de otras enfermedades que tenga. QU TIPO DE MUESTRA SE TOMA? El mdico utilizar un pequeo hisopo de algodn, una esptula de plstico o un cepillo para Landscape architect una muestra de clulas de la superficie del cuello del tero. El cuello del tero es la apertura del tero, que tambin se conoce como matriz. Tambin pueden recolectarse las secreciones del cuello del tero y la vagina. CMO DEBO PREPARARME PARA ESTA PRUEBA?  Tenga en cuenta en qu etapa del ciclo menstrual se encuentra. Es posible que Human resources officer la prueba si est Magazine features editor en que debe  realizrsela.  Si el da en que debe realizarse la prueba tiene una infeccin vaginal aparente, deber reprogramar la prueba.  Pueden pedirle que evite tomar una ducha o bao el da de la prueba o el da anterior.  Algunos medicamentos pueden [employer] de la prueba, como los digitlicos y Regulatory affairs officer. Si toma alguno de [employer], hable con su mdico antes de [employer] prueba. QU SIGNIFICAN LOS RESULTADOS? Los [employer] de la prueba pueden indicar diversas enfermedades. Estas pueden incluir lo siguiente:  Cncer. Si bien los resultados de la prueba de Papanicolaou no pueden utilizarse para Consulting civil engineer de cuello del tero, de vagina o de tero, pueden indicar que existe una posibilidad de presencia de cncer. En [place], ser necesario realizar pruebas adicionales para determinar la presencia de cncer.  Enfermedad de transmisin sexual.  Infecciones por hongos.  Infeccin por parsitos.  Infeccin por herpes.  Una enfermedad que causa o favorece la infertilidad. Es su responsabilidad retirar el resultado del [place]. Consulte en el laboratorio o en el departamento en el que fue realizado el estudio cundo y cmo podr [employer]. Comunquese con el mdico si tiene [employer]. Esta informacin no tiene Theme park manager  el consejo del mdico. Asegrese de hacerle al mdico cualquier pregunta que tenga. Document Released: 03/20/2008 Document Revised: 10/23/2014 Document Reviewed: 02/23/2014 Elsevier Interactive Patient Education  [employer]2018 Elsevier Inc.

## 2017-05-08 LAB — CERVICOVAGINAL ANCILLARY ONLY
Bacterial vaginitis: NEGATIVE
Candida vaginitis: POSITIVE — AB

## 2017-05-09 ENCOUNTER — Telehealth: Payer: Self-pay

## 2017-05-09 ENCOUNTER — Other Ambulatory Visit: Payer: Self-pay | Admitting: Family Medicine

## 2017-05-09 DIAGNOSIS — B3731 Acute candidiasis of vulva and vagina: Secondary | ICD-10-CM

## 2017-05-09 DIAGNOSIS — B373 Candidiasis of vulva and vagina: Secondary | ICD-10-CM

## 2017-05-09 LAB — CYTOLOGY - PAP
DIAGNOSIS: NEGATIVE
HPV (WINDOPATH): NOT DETECTED

## 2017-05-09 MED ORDER — FLUCONAZOLE 150 MG PO TABS: 150.0000 mg | ORAL_TABLET | Freq: Once | ORAL | 0 refills | Status: AC

## 2017-05-09 NOTE — Telephone Encounter (Signed)
CMA call regarding PAP results  Patient did not answer & no VM is full to leave messages

## 2017-05-09 NOTE — Telephone Encounter (Signed)
-----   Message from Lizbeth BarkMandesia R Hairston, FNP sent at 05/09/2017  1:23 PM EDT ----- Pap smear showed no lesions or malignancy. Yeast was positive. You will be prescribed diflucan to treat. To reduce your risk of developing yeast  don't douche, don't use scented soap or sprays, and wear cotton undergarments.  BV was negative.

## 2017-05-21 ENCOUNTER — Ambulatory Visit: Payer: Commercial Managed Care - PPO | Attending: Family Medicine

## 2017-05-21 DIAGNOSIS — Z8619 Personal history of other infectious and parasitic diseases: Secondary | ICD-10-CM

## 2017-05-22 ENCOUNTER — Ambulatory Visit: Payer: Commercial Managed Care - PPO | Attending: Internal Medicine | Admitting: Pharmacist

## 2017-05-22 DIAGNOSIS — Z7984 Long term (current) use of oral hypoglycemic drugs: Secondary | ICD-10-CM | POA: Insufficient documentation

## 2017-05-22 DIAGNOSIS — E1165 Type 2 diabetes mellitus with hyperglycemia: Secondary | ICD-10-CM

## 2017-05-22 DIAGNOSIS — E119 Type 2 diabetes mellitus without complications: Secondary | ICD-10-CM | POA: Diagnosis present

## 2017-05-22 LAB — H. PYLORI BREATH TEST: H pylori Breath Test: NEGATIVE

## 2017-05-22 MED ORDER — GLIPIZIDE 10 MG PO TABS: 10.0000 mg | ORAL_TABLET | Freq: Two times a day (BID) | ORAL | 2 refills | Status: DC

## 2017-05-22 NOTE — Progress Notes (Signed)
    S:     Chief Complaint  Patient presents with  . Medication Management    Patient arrives in good spirits.  Presents for diabetes evaluation, education, and management at the request of Arrie SenateMandesia Hairston, NP. Patient was referred on 05/07/17.  Patient was last seen by Primary Care Provider on 05/07/17. [facility] Miguel [phone] was used for the entirety of the visit.   Patient reports adherence with medications.  Current diabetes medications include: metformin XL 1000 mg BID, glipizide 5 mg BID.   Patient denies hypoglycemic events.  Patient reported dietary habits: Eats 3 meals/day Breakfast: bread, ham, and coffee with no sugar Lunch: meat and tortillas Dinner: same as lunch Snacks: none Drinks: water and some soda  Patient reported exercise habits: walks   Patient reports nocturia 3 times per night.  Patient reports neuropathy. Patient denies visual changes. Patient reports self foot exams.   Marlowe [email or web address]   O:  Physical Exam   ROS   Lab Results  Component Value Date   HGBA1C 11.2 04/09/2017   There were no vitals filed for this visit.  Home fasting CBG: 261s-300s  2 hour post-prandial/random CBG:300s    A/P: Diabetes longstanding currently uncontrolled based on A1c of 11.2 and home CBGs. Patient denies hypoglycemic events and is able to verbalize appropriate hypoglycemia management plan. Patient reports adherence with medication. Control is suboptimal due to dietary indiscretion and sedentary lifestyle.  Continue metformin XL 1000 mg BID. Increase glipizide to 10 mg BID. Will likely need additional agent at next visit. Jardiance and Januvia both appear to be covered by [facility] so will plan to use one of those agents unless insulin is needed. Provided education on basic carb counting and the plate method. Educational materials given on A1c, complications of diabetes, and meal planning (all in [place]).   Next A1C anticipated September 2018.    Written  patient instructions provided.  Total time in face to face counseling 20 minutes.   Follow up in Pharmacist Clinic Visit in 2 weeks.

## 2017-05-22 NOTE — Patient Instructions (Signed)
Thank you for coming to see me  Increase glipizide to 10 mg twice a day with meals.  Come back in 2 weeks for blood sugar review   Recuento de carbohidratos para la diabetes mellitus en los adultos Carbohydrate Counting for Diabetes Mellitus, Adult El recuento de carbohidratos es un mtodo destinado a Midwifellevar un registro de la cantidad de carbohidratos que se ingieren. La ingesta natural de carbohidratos aumenta la cantidad de azcar (glucosa) en la sangre. El recuento de la cantidad de carbohidratos que se ingieren sirve para que el nivel de glucosa sangunea (glucemia) permanezca dentro de los lmites normales, lo que ayuda a Pharmacologistmantener la diabetes (diabetes mellitus) bajo control. Es importante saber la cantidad de carbohidratos que se pueden ingerir en cada comida sin correr Surveyor, mineralsningn riesgo. Esto es Government social research officerdiferente en cada persona. Un especialista en alimentacin y nutricin (nutricionista certificado) puede ayudarlo a crear un plan de alimentacin y a calcular la cantidad de carbohidratos que debe ingerir en cada comida y colacin. Los siguientes alimentos incluyen carbohidratos:  Granos, como panes y cereales.  Frijoles secos y productos con soja.  Verduras con almidn, como papas, guisantes y maz.  Nils PyleFrutas y jugos de frutas.  Leche y Dentistyogur.  Dulces y colaciones, como pasteles, galletas, caramelos, papas fritas y refrescos.  Cmo se calculan los carbohidratos? Hay dos maneras de calcular los carbohidratos de los alimentos. Puede usar cualquiera de 1 Kamani Stlos dos mtodos o [place] combinacin de [place]ambos. Leer la etiqueta de informacin nutricional de los alimentos envasados La lista de informacin nutricional est incluida en las etiquetas de casi todas las bebidas y los alimentos envasados de los [place]. Incluye lo siguiente:  El tamao de la porcin.  Informacin sobre los nutrientes de cada porcin, incluidos los gramos (g) de carbohidratos por porcin.  Para usar la informacin  nutricional:  Decida cuntas porciones va a comer.  Multiplique la cantidad de porciones por el nmero de carbohidratos por porcin.  El resultado es la cantidad total de carbohidratos que comer.  Conocer los tamaos de las porciones estndar de otros alimentos Cuando coma alimentos que contienen carbohidratos que no estn envasados o no incluyen la informacin nutricional en la etiqueta, debe medir las porciones para poder calcular la cantidad de carbohidratos:  Mida los alimentos que comer con una balanza de alimentos o una taza medidora, si es necesario.  Decida cuntas porciones de Programmer, systemstamao estndar comer.  Multiplique el nmero de porciones por15. La mayora de los alimentos con alto contenido de carbohidratos contienen unos 15g de carbohidratos por porcin. ? Por ejemplo, si come 8onzas (170g) de fresas, habr comido 2porciones y 30g de carbohidratos (2porciones x 15g=30g).  En el caso de las comidas que contienen mezclas de ms de un alimento, como las sopas y los guisos, debe calcular los carbohidratos de cada alimento que se incluye.  La siguiente World Fuel Services Corporationlista incluye los tamaos de las porciones estndar de los alimentos corrientes con alto contenido de carbohidratos. Cada una de estas porciones tiene aproximadamente 15g de carbohidratos:  pan de hamburguesa o muffin ingls.  onza (15ml) de almbar.  onza (14g) de gelatina.  1rebanada de pan.  1tortilla de seispulgadas.  3onzas (85g) de arroz o pasta cocidos.  4onzas (113g) de frijoles secos cocidos.  4onzas (113g) de verduras con almidn, como guisantes, maz o papas.  4onzas (113g) de cereal caliente.  4onzas (113g) de pur de papas o de una papa grande al horno.  4onzas (113g) de frutas en lata o congeladas.  4onzas (120ml) de  jugo de frutas.  4a 6galletas.  6croquetas de pollo.  6onzas (170g) de cereales secos sin azcar.  6onzas (170g) de yogur descremado sin  ningn agregado o de yogur endulzado con edulcorante artificial.  8onzas ( ) de [place].  8onzas (170g) de frutas frescas o una fruta pequea.  24onzas (680g) de palomitas de maz.  Ejemplo de recuento de carbohidratos Ejemplo de comida  3onzas (85g) de pechuga de pollo.  6onzas (170g) de arroz integral.  4onzas (113g) de maz.  8onzas ( ) de leche.  8onzas (170g) de fresas con crema batida sin azcar. Clculo de carbohidratos 1. Identifique los alimentos que contienen carbohidratos: ? Arroz. ? Maz. ? Leche. ? Jinny Sanders. 2. Calcule cuntas porciones come de cada alimento: ? 2 porciones de arroz. ? 1 porcin de maz. ? 1 porcin de leche. ? 1 porcin de fresas. 3. Multiplique cada nmero de porciones por 15g: ? 2 porciones de arroz x 15 g = 30 g. ? 1 porcin de maz x 15 g = 15 g. ? 1 porcin de leche x 15 g = 15 g. ? 1 porcin de fresas x 15 g = 15 g. 4. Sume todas las cantidades para conocer el total de gramos de carbohidratos consumidos: ? 30g + 15g + 15g + 15g = 75g de carbohidratos en total. Esta informacin no tiene como fin reemplazar el consejo del mdico. Asegrese de hacerle al mdico cualquier pregunta que tenga. Document Released: 12/25/2011 Document Revised: 09/19/2016 Document Reviewed: 03/15/2016 Elsevier Interactive Patient Education  2018 [employer].  La diabetes mellitus y los alimentos (Diabetes Mellitus and Food) Es importante que controle su nivel de azcar en la sangre (glucosa). El nivel de glucosa en sangre depende en gran medida de lo que usted come. Comer alimentos saludables en las cantidades [place] a lo largo del Futures trader, aproximadamente a la misma hora [employer], lo ayudar a Chief Operating Officer su nivel de Event organiser. Tambin puede ayudarlo a retrasar o Fish farm manager de la diabetes mellitus. Comer de [employer] Corporation saludable incluso puede ayudarlo a Event organiser de presin arterial y a Barista o Pharmacologist un  peso saludable. Entre las recomendaciones generales para alimentarse y Water quality scientist los alimentos de forma saludable, se incluyen las siguientes:  Respetar las comidas principales y comer colaciones con regularidad. Evitar pasar largos perodos sin comer con el fin de perder peso.  Seguir una dieta que consista principalmente en alimentos de origen vegetal, como frutas, vegetales, frutos secos, legumbres y cereales integrales.  Utilizar mtodos de coccin a baja temperatura, como hornear, en lugar de mtodos de coccin a alta temperatura, como frer en abundante aceite. Trabaje con el nutricionista para aprender a Acupuncturist nutricional de las etiquetas de los alimentos. CMO PUEDEN AFECTARME LOS ALIMENTOS? Carbohidratos Los carbohidratos afectan el nivel de glucosa en sangre ms que cualquier otro tipo de alimento. El nutricionista lo ayudar a Chief Strategy Officer cuntos carbohidratos puede consumir en cada comida y ensearle a contarlos. El recuento de carbohidratos es importante para mantener la glucosa en sangre en un nivel saludable, en especial si utiliza insulina o toma determinados medicamentos para la diabetes mellitus. Alcohol El alcohol puede provocar disminuciones sbitas de la glucosa en sangre (hipoglucemia), en especial si utiliza insulina o toma determinados medicamentos para la diabetes mellitus. La hipoglucemia es una afeccin que puede poner en peligro la vida. Los sntomas de la hipoglucemia (somnolencia, mareos y Administrator) son similares a los sntomas de haber consumido mucho alcohol. Si el mdico lo autoriza a beber alcohol,  hgalo con moderacin y siga estas pautas:  Las mujeres no deben beber ms de un trago por da, y los hombres no deben beber ms de dos tragos por Futures trader. Un trago es igual a: ? 12 onzas (355 ml) de cerveza ? 5 onzas de vino (150 ml) de vino ? 1,5onzas (45ml) de bebidas espirituosas  No beba con el estmago vaco.  Mantngase hidratado. Beba agua,  gaseosas dietticas o t helado sin azcar.  Las gaseosas comunes, los jugos y otros refrescos podran contener muchos carbohidratos y se Heritage manager. QU ALIMENTOS NO SE RECOMIENDAN? Cuando haga las elecciones de alimentos, es importante que recuerde que todos los alimentos son distintos. Algunos tienen menos nutrientes que otros por porcin, aunque podran tener la misma cantidad de caloras o carbohidratos. Es difcil darle al cuerpo lo que necesita cuando consume alimentos con menos nutrientes. Estos son algunos ejemplos de alimentos que debera evitar ya que contienen muchas caloras y carbohidratos, pero pocos nutrientes:  Neurosurgeon trans (la mayora de los alimentos procesados incluyen grasas trans en la etiqueta de Informacin nutricional).  Gaseosas comunes.  Jugos.  Caramelos.  Dulces, como tortas, pasteles, rosquillas y [place].  Comidas fritas. QU ALIMENTOS PUEDO COMER? Consuma alimentos ricos en nutrientes, que nutrirn el cuerpo y lo mantendrn saludable. Los alimentos que debe comer tambin dependern de varios factores, como:  Las caloras que necesita.  Los medicamentos que toma.  Su peso.  El nivel de glucosa en [place].  El [place] de presin arterial.  El nivel de colesterol. Debe consumir una amplia variedad de alimentos, por ejemplo:  Protenas. ? Cortes de Target Corporation. ? Protenas con bajo contenido de grasas saturadas, como pescado, clara de huevo y frijoles. Evite las carnes procesadas.  Frutas y vegetales. ? [place] y Sports administrator que pueden ayudar a [employer] Corporation niveles sanguneos de [place], como [place], mangos y batatas.  Productos lcteos. ? Elija productos lcteos sin grasa o con bajo contenido de [place], como [place], yogur y [place].  Cereales, panes, pastas y arroz. ? Elija cereales integrales, como panes multicereales, avena en grano y arroz integral. Estos alimentos pueden ayudar a controlar la presin arterial.  Rosalin Hawking. ? Alimentos que  contengan grasas saludables, como frutos secos, Chartered certified accountant, aceite de [place], aceite de canola y pescado. TODOS LOS QUE PADECEN DIABETES MELLITUS TIENEN EL MISMO PLAN DE COMIDAS? Dado que todas las personas que padecen diabetes mellitus son distintas, no hay un solo plan de comidas que funcione para todos. Es muy importante que se rena con un nutricionista que lo ayudar a crear un plan de comidas adecuado para usted. Esta informacin no tiene Theme park manager el consejo del mdico. Asegrese de hacerle al mdico cualquier pregunta que tenga. Document Released: 01/09/2008 Document Revised: 10/23/2014 Document Reviewed: 08/29/2013 Elsevier Interactive Patient Education  2017 [employer].   Consejos para comer fuera de su casa si tiene diabetes (Tips for Eating Away From Home If You Have Diabetes) El control del nivel de glucemia, que tambin se conoce como azcar en la [place], puede ser un reto, que se complica an ms cuando uno no prepara sus propias comidas. Los siguientes consejos pueden ayudarlo a Chief Operating Officer la diabetes cuando come fuera de su casa. PLANIFICACIN Organcese si sabe que comer fuera de su casa:  Pregntele al mdico cmo sincronizar las comidas y el medicamento si est en tratamiento con insulina.  Haga una lista de restaurantes cercanos que ofrezcan opciones saludables. Si tienen un men que pueda leer en su casa, llvelo y planifique  lo que pedir con anticipacin.  Busque informacin en lnea del restaurante donde quiera comer. Muchos restaurantes de comida rpida y cadenas de restaurantes incluyen la informacin nutricional en lnea. Tenga en cuenta esta informacin para elegir las opciones ms saludables y calcular los carbohidratos de la comida.  Use un libro de recuento de carbohidratos o una aplicacin mvil para fijarse en el contenido de carbohidratos y el tamao de porcin de lo que desea comer.  Comience a Armed forces training and education officer de las porciones y a Public house manager  cuntas porciones hay en una unidad. Esto le permitir calcular la cantidad de carbohidratos que puede comer. ALIMENTOS LIBRES Un "alimento libre" es cualquier alimento o bebida que contenga menos de 5g de carbohidratos por porcin. Entre los alimentos libres, se incluyen los siguientes:  Muchos vegetales.  Huevos duros.  Nueces o semillas.  Aceitunas.  Quesos.  Carnes. Estos tipos de alimentos son buenas opciones de bocadillos y en general estn disponibles en los bufs de ensaladas. Como aderezos "libres" para [place], puede usarse jugo de limn, vinagre o un aderezo de bajas caloras (con menos de 20caloras por porcin). OPCIONES PARA REDUCIR LOS CARBOHIDRATOS  Reemplace el yogur descremado endulzado por el yogur sin azcar. Tambin puede consumir yogur a base de [place] de [place], pero es conveniente una opcin sin azcar o natural, porque tiene menos contenido de carbohidratos.  Pdale al mozo que retire la canasta de pan o las papas de la mesa.  Pida frutas frescas. El buf de ensaladas a menudo ofrece frutas frescas. Evite las frutas enlatadas, ya que por lo general tienen azcar o almbar.  Pida una ensalada y cmala sin aderezo. Tambin puede crear un aderezo "libre" para ensaladas.  Pida que le [employer] Media alimentos. Por ejemplo, en lugar de papas fritas, pida una porcin de vegetales, como una ensalada, judas verdes o brcoli. OTROS CONSEJOS  Si [place] insulina, adminstrela una vez que la comida llegue a la mesa, as las Automotive engineer.  Pregntele al mozo sobre el tamao de la porcin antes de pedir la comida y, si la porcin es ms grande de lo que usted debe consumir, pida una caja para llevarse la comida a su casa. Cuando llegue la comida, deje en el plato la cantidad que debe comer y coloque el resto en la caja para llevar.  Considere la posibilidad de Agricultural consultant un plato principal con alguien y de pedir una ensalada como guarnicin. Esta informacin no  tiene Theme park manager el consejo del mdico. Asegrese de hacerle al mdico cualquier pregunta que tenga. Document Released: 10/02/2005 Document Revised: 01/24/2016 Document Reviewed: 12/30/2013 Elsevier Interactive Patient Education  [employer].

## 2017-05-30 ENCOUNTER — Telehealth: Payer: Self-pay

## 2017-05-30 NOTE — Telephone Encounter (Signed)
-----   Message from Lizbeth BarkMandesia R Hairston, FNP sent at 05/30/2017  9:08 AM EDT ----- -H.pylori is negative. Treatment course was effective. H.pylori is a bacteria that can infect the stomach and cause stomach ulcers.

## 2017-05-30 NOTE — Telephone Encounter (Signed)
CMA call regarding lab results  Patient did not answer but left a detailed message per DPR stating if have any questions just to call back

## 2017-06-05 ENCOUNTER — Ambulatory Visit: Payer: Commercial Managed Care - PPO | Attending: Family Medicine | Admitting: Pharmacist

## 2017-06-05 DIAGNOSIS — Z7984 Long term (current) use of oral hypoglycemic drugs: Secondary | ICD-10-CM | POA: Diagnosis not present

## 2017-06-05 DIAGNOSIS — E1165 Type 2 diabetes mellitus with hyperglycemia: Secondary | ICD-10-CM | POA: Diagnosis not present

## 2017-06-05 DIAGNOSIS — R351 Nocturia: Secondary | ICD-10-CM | POA: Diagnosis not present

## 2017-06-05 DIAGNOSIS — E1140 Type 2 diabetes mellitus with diabetic neuropathy, unspecified: Secondary | ICD-10-CM | POA: Insufficient documentation

## 2017-06-05 DIAGNOSIS — E119 Type 2 diabetes mellitus without complications: Secondary | ICD-10-CM | POA: Diagnosis present

## 2017-06-05 MED ORDER — SITAGLIPTIN PHOSPHATE 50 MG PO TABS: 50.0000 mg | ORAL_TABLET | Freq: Every day | ORAL | 2 refills | Status: DC

## 2017-06-05 NOTE — Patient Instructions (Signed)
Thank you for coming to see me  Please start Januvia 50 mg daily.  Come back in 2 weeks for blood sugar review  Sitagliptin oral tablet Qu es este medicamento? La SITAGLIPTINA ayuda a tratar la diabetes tipo 2. Ayuda a Psychologist, clinical. El tratamiento se [place] con ejercicios y [place]. Este medicamento puede ser utilizado para otros usos; si tiene alguna pregunta consulte con su proveedor de atencin mdica o con su farmacutico. MARCAS COMUNES: Januvia Qu le debo informar a mi profesional de la salud antes de tomar este medicamento? Necesita saber si usted presenta alguno de los siguientes problemas o situaciones: -cetoacidosis diabtica -enfermedad renal -pancreatitis -hinchazon previa de la lengua, cara o labios con dificultad para respirar, dificultad para tragar, ronquera o estrechamiento de la garganta -diabetes tipo 1 -una reaccin alrgica o inusual a la sitagliptina, a otros medicamentos, alimentos, colorantes o conservantes -si est embarazada o buscando quedar embarazada -si est amamantando a un beb Cmo debo utilizar este medicamento? Tome este medicamento por va oral con un vaso de agua. Siga las instrucciones de la etiqueta del [place]. Puede tomarlo con o sin alimentos. No corte, triture ni [employer]. Tome sus dosis a la [employer]. No tome su medicamento con una frecuencia mayor a la indicada. No deje de tomarlo excepto si as lo indica su mdico. Hable con su pediatra para informarse acerca del uso de este medicamento en nios. Puede requerir atencin especial. Sobredosis: Pngase en contacto inmediatamente con un centro toxicolgico o una sala de urgencia si usted cree que haya tomado demasiado medicamento. ATENCIN: Reynolds American es solo para usted. No comparta este medicamento con nadie. Qu sucede si me olvido de una dosis? Si olvida una dosis, tmela lo antes posible. Si es casi la hora de la prxima  dosis, tome slo esa dosis. No tome dosis adicionales o dobles. Qu puede interactuar con este medicamento? No tome esta medicina con ninguno de los siguientes medicamentos: -gatifloxacino Esta medicina tambin puede interactuar con los siguientes medicamentos: -alcohol -digoxina -insulinas -sulfonilureas, tales como glimepirida, glipizida, gliburida Puede ser que esta lista no menciona todas las posibles interacciones. Informe a su profesional de [employer] de [employer] Inc productos a base de hierbas, medicamentos de [place] o suplementos nutritivos que est tomando. Si usted fuma, consume bebidas alcohlicas o si utiliza drogas ilegales, indqueselo tambin a su profesional de [employer]. Algunas sustancias pueden interactuar con su medicamento. A qu debo estar atento al usar PPL Corporation? Visite a su mdico o a su profesional de la salud para chequear su evolucin peridicamente. Un examen llamado HbA1C (A1C) ser monitoreado. Es un simple examen de [place]. Mide su control de azcar en la sangre durante los ltimos 2 a 3 meses. Usted recibir Medtronic cada 3 a 6 meses. Aprenda cmo controlar el nivel de azcar en la sangre. Aprenda a reconocer los sntomas de bajo y alto nivel de azcar en la sangre y cmo tratarlos. Siempre lleve consigo una fuente rpida de azcar por si acaso experimenta sntomas de bajo nivel de azcar en la sangre. Ejemplos incluyen caramelos duros o tabletas de glucosa. Asegrese de que los miembros de su familia sepan que se puede ahogar si come o bebe mientras tiene sntomas graves de bajo nivel de azcar en la sangre, tales como convulsiones o prdida del conocimiento. Deben obtener ayuda mdica inmediatamente. Informe a su mdico o a su profesional de la salud si tiene Medical illustrator de International aid/development worker  en la sangre. Tal vez sea necesario cambiar la dosis de su medicamento. Si est enfermo o haciendo mucho ms ejercicio que el habitual, puede ser necesario cambiar la dosis de su  medicamento. No se salte comidas. Pregunte a su mdico o a su profesional de la salud si debe evitar el consumo de alcohol. Muchos productos de venta libre para tos y resfros contienen azcar y alcohol. Estos pueden Biochemist, clinical de azcar en la sangre. Use una pulsera o cadena de identificacin mdica. Lleve consigo una tarjeta de identificacin con informacin sobre su enfermedad y Engineer, manufacturing systems de sus medicamentos y los horarios de las dosis. Qu efectos secundarios puedo tener al Boston Scientific este medicamento? Efectos secundarios que debe informar a su mdico o a Producer, television/film/video de la salud tan pronto como sea posible: -Therapist, art como erupcin cutnea, picazn o urticarias, hinchazn de la cara, labios o lengua -problemas respiratorios -fiebre, escalofros -dolor articular -prdida del apetito -signos y sntomas de bajo nivel de azcar en la sangre tales como sentirse ansioso, confusin, mareos, aumento de apetito, debilidad o cansancio inusual, sudoracin, temblores, fro, irritabilidad, dolor de cabeza, visin borrosa, pulso cardaco rpido, prdida del conocimiento -dolor o Programme researcher, broadcasting/film/video inusual -vmito Efectos secundarios que, por lo general, no requieren atencin mdica (debe informarlos a su mdico o a su profesional de la salud si persisten o si son molestos): -diarrea -dolor de cabeza -dolor de garganta -Dentist estomacal -nariz tapada o goteo de la nariz Puede ser que esta lista no menciona todos los posibles efectos secundarios. Comunquese a su mdico por asesoramiento mdico Hewlett[employer]ard. Usted puede informar los efectos secundarios a la FDA por telfono al 1-800-FDA-1088. Dnde debo guardar mi medicina? Mantngala fuera del alcance de los nios. Gurdela a San[employer]-SCI, entre 15 y 30 grados C (54 y 81 grados F). Deseche todo el medicamento que no haya utilizado, despus de la fecha de vencimiento. ATENCIN: Este folleto es un resumen.  Puede ser que no cubra toda la posible informacin. Si usted tiene preguntas acerca de esta medicina, consulte con su mdico, su farmacutico o su profesional de Radiographer, therapeutic.  2018 Elsevier/Gold Standard (2014-11-25 00:00:00)

## 2017-06-05 NOTE — Progress Notes (Signed)
    S:     No chief complaint on file.   Patient arrives in good spirits.  Presents for diabetes evaluation, education, and management at the request of Arrie Senate, NP. Patient was referred on 05/07/17.  Patient was last seen by Primary Care Provider on 05/07/17. [employer] Interpreter [place] [phone] was used for the entirety of the visit.   Patient reports adherence with medications.  Current diabetes medications include: metformin XL 1000 mg BID, glipizide 10 mg BID.   Patient denies hypoglycemic events.  Patient reported dietary habits: has been trying to monitor her carb intake more.   Patient reported exercise habits: walks   Patient reports nocturia 3 times per night.  Patient reports neuropathy. Patient denies visual changes. Patient reports self foot exams.    O:  Physical Exam   ROS   Lab Results  Component Value Date   HGBA1C 11.2 04/09/2017   There were no vitals filed for this visit.  Home fasting CBG: 190s-upper 200s 2 hour post-prandial/random CBG:200s   A/P: Diabetes longstanding currently uncontrolled based on A1c of 11.2 and home CBGs but her home CBGs are improving. Patient denies hypoglycemic events and is able to verbalize appropriate hypoglycemia management plan. Patient reports adherence with medication. Control is suboptimal due to dietary indiscretion and sedentary lifestyle.  Continue metformin XL 1000 mg BID and glipizide to 10 mg BID. Initiate Januvia 50 mg daily - will start at lower dose as patient is on glipizide. Counseling provided on Januvia including how to take, how it works, and possible adverse effects. Patient to return to see me in 2 weeks with blood glucose readings.   Next A1C anticipated September 2018.    Written patient instructions provided.  Total time in face to face counseling 20 minutes.   Follow up in Pharmacist Clinic Visit in 2 weeks.

## 2017-06-19 ENCOUNTER — Ambulatory Visit: Payer: Commercial Managed Care - PPO | Admitting: Pharmacist

## 2017-06-27 ENCOUNTER — Ambulatory Visit: Payer: Commercial Managed Care - PPO | Attending: Family Medicine | Admitting: Pharmacist

## 2017-06-27 DIAGNOSIS — E1165 Type 2 diabetes mellitus with hyperglycemia: Secondary | ICD-10-CM | POA: Diagnosis not present

## 2017-06-27 DIAGNOSIS — E119 Type 2 diabetes mellitus without complications: Secondary | ICD-10-CM | POA: Diagnosis not present

## 2017-06-27 DIAGNOSIS — Z7984 Long term (current) use of oral hypoglycemic drugs: Secondary | ICD-10-CM | POA: Diagnosis not present

## 2017-06-27 DIAGNOSIS — Z79899 Other long term (current) drug therapy: Secondary | ICD-10-CM | POA: Diagnosis not present

## 2017-06-27 MED ORDER — GLIPIZIDE 10 MG PO TABS: 10.0000 mg | ORAL_TABLET | Freq: Two times a day (BID) | ORAL | 2 refills | Status: DC

## 2017-06-27 MED ORDER — SITAGLIPTIN PHOSPHATE 100 MG PO TABS: 100.0000 mg | ORAL_TABLET | Freq: Every day | ORAL | 2 refills | Status: DC

## 2017-06-27 NOTE — Progress Notes (Signed)
    S:     Chief Complaint  Patient presents with  . Medication Management    Patient arrives in good spirits.  Presents for diabetes evaluation, education, and management at the request of Arrie SenateMandesia Hairston, NP. Patient was referred on 05/07/17.  Patient was last seen by Primary Care Provider on 05/07/17. Pacific Interpreter Roxy Mannslexei [phone] was used for the entirety of the visit.   Patient reports adherence with medications.  Current diabetes medications include: metformin XL 1000 mg BID, glipizide 10 mg BID, Januvia 50 mg daily.  Of note, patient has glipizide 5 mg tablets with her but she has been taking 2 of them in the morning and 2 at night.  Patient denies hypoglycemic events. The lowest blood sugar she has seen was in the afternoon and it was 140. She feels that her morning blood sugars are still high but that during the day they get better. She does not have her meter with her today.  Patient reported dietary habits: has been trying to monitor her carb intake more.   Patient reported exercise habits: walks   Patient reports nocturia 3 times per night.  Patient reports neuropathy. Patient denies visual changes. Patient reports self foot exams.      O:  Physical Exam   ROS   Lab Results  Component Value Date   HGBA1C 11.2 04/09/2017   There were no vitals filed for this visit.  Home fasting CBG: 190s-upper 200s 2 hour post-prandial/random CBG:140s recently   A/P: Diabetes longstanding currently uncontrolled based on A1c of 11.2 and home CBGs but her home CBGs are improving. Patient denies hypoglycemic events and is able to verbalize appropriate hypoglycemia management plan. Patient reports adherence with medication. Control is suboptimal due to dietary indiscretion and sedentary lifestyle.  Continue metformin XL 1000 mg BID and glipizide to 10 mg BID. Confirmed patient is actually taking this dose of glipizide. Increase Januvia to 100 mg daily. Provided  information dietary changes however, if fastings do not improve, patient will likely need additional medication. If A1c is not much improved a next visit, may want to consider insulin.  Next A1C anticipated late September 2018.    Written patient instructions provided.  Total time in face to face counseling 20 minutes.   Follow up with PCP in 2 weeks for diabetes follow up and A1c.

## 2017-06-27 NOTE — Patient Instructions (Addendum)
Increase Januvia to 100 mg daily  Follow up with Mandesia in 2 weeks for diabetes follow and A1c   Recuento de carbohidratos para la diabetes mellitus en los adultos Carbohydrate Counting for Diabetes Mellitus, Adult El recuento de carbohidratos es un mtodo destinado a Midwife un registro de la cantidad de carbohidratos que se ingieren. La ingesta natural de carbohidratos aumenta la cantidad de azcar (glucosa) en la sangre. El recuento de la cantidad de carbohidratos que se ingieren sirve para que el nivel de glucosa sangunea (glucemia) permanezca dentro de los lmites normales, lo que ayuda a Pharmacologist la diabetes (diabetes mellitus) bajo control. Es importante saber la cantidad de carbohidratos que se pueden ingerir en cada comida sin correr Surveyor, minerals. Esto es Government social research officer. Un especialista en alimentacin y nutricin (nutricionista certificado) puede ayudarlo a crear un plan de alimentacin y a calcular la cantidad de carbohidratos que debe ingerir en cada comida y colacin. Los siguientes alimentos incluyen carbohidratos:  Granos, como panes y cereales.  Frijoles secos y productos con soja.  Verduras con almidn, como papas, guisantes y maz.  Nils Pyle y jugos de frutas.  Leche y Dentist.  Dulces y colaciones, como pasteles, galletas, caramelos, papas fritas y refrescos.  Cmo se calculan los carbohidratos? Hay dos maneras de calcular los carbohidratos de los alimentos. Puede usar cualquiera de [address] o [place] combinacin de Julian. Leer la etiqueta de informacin nutricional de los alimentos envasados La lista de informacin nutricional est incluida en las etiquetas de casi todas las bebidas y los alimentos envasados de los Leonard. Incluye lo siguiente:  El tamao de la porcin.  Informacin sobre los nutrientes de cada porcin, incluidos los gramos (g) de carbohidratos por porcin.  Para usar la informacin nutricional:  Decida cuntas porciones va  a comer.  Multiplique la cantidad de porciones por el nmero de carbohidratos por porcin.  El resultado es la cantidad total de carbohidratos que comer.  Conocer los tamaos de las porciones estndar de otros alimentos Cuando coma alimentos que contienen carbohidratos que no estn envasados o no incluyen la informacin nutricional en la etiqueta, debe medir las porciones para poder calcular la cantidad de carbohidratos:  Mida los alimentos que comer con una balanza de alimentos o una taza medidora, si es necesario.  Decida cuntas porciones de Programmer, systems.  Multiplique el nmero de porciones por15. La mayora de los alimentos con alto contenido de carbohidratos contienen unos 15g de carbohidratos por porcin. ? Por ejemplo, si come 8onzas (170g) de fresas, habr comido 2porciones y 30g de carbohidratos (2porciones x 15g=30g).  En el caso de las comidas que contienen mezclas de ms de un alimento, como las sopas y los guisos, debe calcular los carbohidratos de cada alimento que se incluye.  La siguiente [employer] tamaos de las porciones estndar de los alimentos corrientes con alto contenido de carbohidratos. Cada una de estas porciones tiene aproximadamente 15g de carbohidratos:  pan de hamburguesa o muffin ingls.  onza (38ml) de almbar.  onza (14g) de gelatina.  1rebanada de pan.  1tortilla de seispulgadas.  3onzas (85g) de arroz o pasta cocidos.  4onzas (113g) de frijoles secos cocidos.  4onzas (113g) de verduras con almidn, como guisantes, maz o papas.  4onzas (113g) de cereal caliente.  4onzas (113g) de pur de papas o de una papa grande al horno.  4onzas (113g) de frutas en lata o congeladas.  4onzas ( ) de jugo de frutas.  4a 6galletas.  6croquetas de  pollo.  6onzas (170g) de cereales secos sin azcar.  6onzas (170g) de yogur descremado sin ningn agregado o de yogur endulzado con  edulcorante artificial.  8onzas (235m) de l[place]  8onzas (170g) de frutas frescas o una fruta pequea.  24onzas (680g) de palomitas de maz.  Ejemplo de recuento de carbohidratos Ejemplo de comida  3onzas (85g) de pechuga de pollo.  6onzas (170g) de arroz integral.  4onzas (113g) de maz.  8onzas (2471m de leche.  8onzas (170g) de fresas con crema batida sin azcar. Clculo de carbohidratos 1. Identifique los alimentos que contienen carbohidratos: ? Arroz. ? Maz. ? Leche. ? FrHughie Closs2. Calcule cuntas porciones come de cada alimento: ? 2 porciones de arroz. ? 1 porcin de maz. ? 1 porcin de leche. ? 1 porcin de fresas. 3. Multiplique cada nmero de porciones por 15g: ? 2 porciones de arroz x 15 g = 30 g. ? 1 porcin de maz x 15 g = 15 g. ? 1 porcin de leche x 15 g = 15 g. ? 1 porcin de fresas x 15 g = 15 g. 4. Sume todas las cantidades para conocer el total de gramos de carbohidratos consumidos: ? 30g + 15g + 15g + 15g = 75g de carbohidratos en total. Esta informacin no tiene como fin reemplazar el consejo del mdico. Asegrese de hacerle al mdico cualquier pregunta que tenga. Document Released: 12/25/2011 Document Revised: 09/19/2016 Document Reviewed: 03/15/2016 Elsevier Interactive Patient Education  2018 ElOaklandHypoglycemia) La hipoglucemia se produce cuando el nivel de azcar (glucosa) en la sangre es demasiado bajo. Los sntomas de la glucemia baja pueden incluir los siguientes:  Sentir que tiene lo siguiente: ? Apetito. ? Preocupacin o nervios (ansioso). ? Sudoracin y piIntel Corporation? Confusin. ? Mareos. ? Somnolencia. ? Nuseas.  Tener lo siguiente: ? Latidos cardacos acelerados. ? Dolor de caNetherlands? Cambios en la visin. ? Una crisis de movimientos que no puede controlar (convulsiones). ? Pesadillas. ? Hormigueo y falta de sensibilidad (adormecimiento) alrededor de la boca, los labios o la  leMorrison Crossroads Dificultades para hacer lo siguiente: ? Hablar. ? Prestar atencin (concentrarse). ? Moverse (coordinacin). ? Dormir.  Temblores.  Desmayos.  Molestarse con facilidad (irritabilidad). Las personas que tienen diabetes y las que no tienen la enfermedad pueden tener la glucemia baja. El nivel bajo de glucemia en la sangre puede ocurrir rpidamente y ser unEngineer, maintenance (IT)Tratamiento de la glucemia baja Generalmente, el tratamiento de la glucemia baja consiste en ingerir de inmediato un alimento o una bebida que contengan azcar. Si puede pensar con claridad y tragar de manera segura, siga la regla 15/15, que consiste en lo siguiente:  Consumir 15gramos de un hidrato de carbono de accin rpida. Algunos hidratos de carbono de accin rpida son los siguientes: ? 1pomo de glucosa en gel. ? 3comprimidos de azcar (comprimidos de glucosa). ? 6 a 8unidades de caramelos duros. ? 4onzas (12034mde jugo de frutas. ? 4onzas (120m66me gaseosa comn (no diettica).  Contrlese la glucemia 15mi19ms despus de ingerir el hidrato de carbono.  Si el nivel de glucosa en la sangre todava es igual o menor que '70mg'$ /dl (3,9mmol59m, ingiera nuevamente 15gramos de un hidrato de carbono.  Si el nivel de glucosa en la sangre no supera los '70mg'$ /dl (3,9mmol/14mdespus de 3intentos, solicite ayuda de inmediato.  Ingiera una comida o una colacin en el transcurso de 1hora despus de que la glucemia se haya normalizado. Tratamiento de la glucemia muy baja Si el nivel de  glucosa en la sangre es igual o menor que '54mg'$ /dl (57mol/l), significa que est muy bajo (hipoglucemia grave). Esto es uEngineer, maintenance (IT) No espere hasta que los sntomas desaparezcan. Solicite atencin mdica de inmediato. Comunquese con el servicio de emergencias de su localidad (911 en los [place]). No conduzca por sus propios medios hPrincipal Financial Si su nivel de glucosa en la sangre muy bajo y no puede  ingerir ningn alimento ni bebida, tal vez deba aplicarse una inyeccin de glucagn. Un familiar o un amigo deben aprender a controlarle la glucemia y a aplicarle una inyeccin de glucagn. Pregntele al mdico si debe tener un kit de inyecciones de glucagn en su casa. CUIDADOS EN EL HOGAR Instrucciones generales  Evite cualquier dieta que le impida ingerir la cantidad suficiente de comida. Hable con el mdico antes de comenzar una dieta nueva.  Tome los medicamentos de venta libre y los recetados solamente como se lo haya indicado el mdico.  Limite el consumo de alcohol a no ms de 163mida por da si es mujer y no est emSan Marino a 40m37mdas por da si es hombre. Una medida equivale a 12onzas de cerveza, 5onzas de vino o 1onzas de bebidas alcohlicas de alta graduacin.  Concurra a todas las visitas de control como se lo haya indicado el mdico. Esto es importante. Si usted tiene diabetes:  Asegrese de conAssurant la hipoglucemia.  Siempre tenga a mano una fuente de azcar, como por ejemplo: ? Azcar. ? Comprimidos de azcar. ? Gel de glucosa. ? Jugo de frutas. ? Gaseosa comn (gaseosa que no sea diettica). ? Leche. ? Caramelos duros. ? Miel.  Tome los medicamentos segn las indicaciones.  Siga el plan de ejercicios y de alimentacin. ? Coma a horario. No omita comidas. ? Siga el plan para los das de enfermedad cuando no pueda comer o beber normalmente. Arme este plan de antemano con el mdico.  Contrlese la glucemia con la frecuencia que le haya indicado el mdico. Contrlesela siempre antes y despus de hacer actividad fsica.  Comparta su plan de atencin de la diabetes con estas personas: ? Compaeros de trabajo o de la escuela. ? Aquellas con las que conHornellHgase anlisis de orina para detProduct manageresencia de cetonas: ? Cuando est enfermo. ? Como se lo haya indicado el mdico.  Lleve consigo una tarjeta, o use un brazalete o una medalla  que indiquen que es diabtico. Si tiene un nivel bajo de glucosa en la sangre debido a otras causas:  Contrlese la glucemia con la frecuencia que le haya indicado el mdico.  Siga las indicaciones del mdico respecto de lo que no puede comer o beber. SOLICITE AYUDA SI:  Tiene problemas para mantener el nivel de glucosa en la sangre dentro del rango indicado.  Tiene la glucemia baja con frecuencia. SOLICITE AYUDA DE INMEDIATO SI:  Contina teniendo sntomas despus de haber comido o ingerido algo con azcar.  La glucemia es igual o inferior a '54mg'$ /dl (3mm14mdl).  Tiene una crisis de movimientos que no puedIT consultante desmaya. Estos sntomas pueden indiSales executive espere hasta que los sntomas desaparezcan. Solicite atencin mdica de inmediato. Comunquese con el servicio de emergencias de su localidad (911 en los [place]). No conduzca por sus propios medios hastPrincipal Financialta informacin no tiene comoMarine scientistconsejo del mdico. Asegrese de hacerle al mdico cualquier pregunta que tenga. Document Released: 11/04/2010 Document Revised: 01/24/2016 Document Reviewed: 11/05/2015 Elsevier Interactive Patient  Education  2018 [employer].

## 2017-07-30 ENCOUNTER — Other Ambulatory Visit: Payer: Self-pay | Admitting: Family Medicine

## 2017-07-30 DIAGNOSIS — A048 Other specified bacterial intestinal infections: Secondary | ICD-10-CM

## 2017-08-16 ENCOUNTER — Ambulatory Visit: Payer: Commercial Managed Care - PPO | Admitting: Family Medicine

## 2017-09-10 ENCOUNTER — Encounter: Payer: Self-pay | Admitting: Family Medicine

## 2017-09-10 ENCOUNTER — Ambulatory Visit: Payer: Commercial Managed Care - PPO | Attending: Family Medicine | Admitting: Family Medicine

## 2017-09-10 VITALS — BP 119/75 | HR 89 | Temp 98.6°F | Resp 18 | Ht 60.0 in | Wt 161.8 lb

## 2017-09-10 DIAGNOSIS — Z7984 Long term (current) use of oral hypoglycemic drugs: Secondary | ICD-10-CM | POA: Diagnosis not present

## 2017-09-10 DIAGNOSIS — N3000 Acute cystitis without hematuria: Secondary | ICD-10-CM | POA: Diagnosis not present

## 2017-09-10 DIAGNOSIS — B351 Tinea unguium: Secondary | ICD-10-CM | POA: Diagnosis not present

## 2017-09-10 DIAGNOSIS — Z79899 Other long term (current) drug therapy: Secondary | ICD-10-CM | POA: Diagnosis not present

## 2017-09-10 DIAGNOSIS — R1013 Epigastric pain: Secondary | ICD-10-CM | POA: Insufficient documentation

## 2017-09-10 DIAGNOSIS — E1165 Type 2 diabetes mellitus with hyperglycemia: Secondary | ICD-10-CM | POA: Insufficient documentation

## 2017-09-10 DIAGNOSIS — K219 Gastro-esophageal reflux disease without esophagitis: Secondary | ICD-10-CM | POA: Diagnosis not present

## 2017-09-10 LAB — POCT URINALYSIS DIPSTICK
BILIRUBIN UA: NEGATIVE
Glucose, UA: 100
Ketones, UA: NEGATIVE
NITRITE UA: NEGATIVE
PH UA: 6 (ref 5.0–8.0)
PROTEIN UA: NEGATIVE
Spec Grav, UA: 1.015 (ref 1.010–1.025)
Urobilinogen, UA: 0.2 E.U./dL

## 2017-09-10 LAB — POCT URINE PREGNANCY: PREG TEST UR: NEGATIVE

## 2017-09-10 LAB — GLUCOSE, POCT (MANUAL RESULT ENTRY): POC GLUCOSE: 213 mg/dL — AB (ref 70–99)

## 2017-09-10 LAB — POCT GLYCOSYLATED HEMOGLOBIN (HGB A1C): HEMOGLOBIN A1C: 8.7

## 2017-09-10 MED ORDER — GLIPIZIDE 10 MG PO TABS: 10.0000 mg | ORAL_TABLET | Freq: Two times a day (BID) | ORAL | 3 refills | Status: DC

## 2017-09-10 MED ORDER — METFORMIN HCL ER 500 MG PO TB24: 1000.0000 mg | ORAL_TABLET | Freq: Two times a day (BID) | ORAL | 3 refills | Status: DC

## 2017-09-10 MED ORDER — NITROFURANTOIN MONOHYD MACRO 100 MG PO CAPS: 100.0000 mg | ORAL_CAPSULE | Freq: Two times a day (BID) | ORAL | 0 refills | Status: DC

## 2017-09-10 MED ORDER — SITAGLIPTIN PHOSPHATE 100 MG PO TABS: 100.0000 mg | ORAL_TABLET | Freq: Every day | ORAL | 3 refills | Status: DC

## 2017-09-10 MED ORDER — PANTOPRAZOLE SODIUM 20 MG PO TBEC: 20.0000 mg | DELAYED_RELEASE_TABLET | Freq: Every day | ORAL | 3 refills | Status: DC

## 2017-09-10 MED FILL — NITROFURANTOIN MONO-MCR 100: 100 | 5 days supply | Qty: 10 | Fill #0

## 2017-09-10 MED FILL — JANUVIA 100 MG TABLET: 100 | 30 days supply | Qty: 30 | Fill #0

## 2017-09-10 NOTE — Patient Instructions (Addendum)
Opciones de alimentos para pacientes con reflujo gastroesofgico - Adultos (Food Choices for Gastroesophageal Reflux Disease, Adult) Cuando se tiene reflujo gastroesofgico (ERGE), los alimentos que se ingieren y los hbitos de alimentacin son muy importantes. Elegir los alimentos adecuados puede ayudar a Altria Groupaliviar las molestias. QU PAUTAS DEBO SEGUIR?  Elija las frutas, los vegetales, los cereales integrales y los productos lcteos con bajo contenido de Evergreengrasa.  Elija las carnes de [place] Grovevaca, de pescado y de ave con bajo contenido de grasas.  Limite las grasas, [address] Lanecomo los Stathamaceites, los aderezos para Turbevilleensalada, la [place], los frutos secos y Programme researcher, broadcasting/film/videoel aguacate.  Lleve un registro de alimentos. Esto ayuda a identificar los alimentos que ocasionan sntomas.  Evite los alimentos que le ocasionen sntomas. Pueden ser distintos para cada persona.  Haga comidas pequeas durante Glass blower/designerel da en lugar de 3 comidas abundantes.  Coma lentamente, en un lugar donde est distendido.  Limite el consumo de alimentos fritos.  Cocine los alimentos utilizando mtodos que no sean la fritura.  Evite el consumo alcohol.  Evite beber grandes cantidades de lquidos con las comidas.  Evite agacharse o recostarse hasta despus de 2 o 3horas de haber comido.  QU ALIMENTOS NO SE RECOMIENDAN? Estos son algunos alimentos y bebidas que pueden empeorar los sntomas: Veterinary surgeonVegetales Tomates. Jugo de tomate. Salsa de tomate y espagueti. Ajes. Cebolla y Boscobelajo. Rbano picante. Frutas Naranjas, pomelos y limn (fruta y Sloveniajugo). Carnes Carnes de [place], de pescado y de ave con gran contenido de grasas. Esto incluye los perros calientes, las Schoolcraftcostillas, el Ivanhoejamn, la salchicha, el salame y el tocino. Lcteos Leche entera y Chesterleche chocolatada. PPG IndustriesCrema cida. Crema. Mantequilla. Helados. Queso crema. Bebidas T o caf. Bebidas gaseosas o bebidas energizantes. Condimentos Salsa picante. Salsa barbacoa. Dulces/postres Chocolate y cacao. Rosquillas.  Menta y mentol. Grasas y Du Pontaceites Alimentos muy grasos. Esto incluye las papas fritas. Otros Vinagre. Especias picantes. Esto incluye la pimienta negra, la pimienta blanca, la pimienta roja, la pimienta de cayena, el curry en polvo, los clavos de [place] Forkolor, el jengibre y el [place] en polvo. Esta no es Raytheonuna lista completa de los alimentos y las bebidas que se Theatre stage managerdeben evitar. Comunquese con el nutricionista para recibir ms informacin. Esta informacin no tiene Theme park managercomo fin reemplazar el consejo del mdico. Asegrese de hacerle al mdico cualquier pregunta que tenga. Document Released: 04/02/2012 Document Revised: 10/23/2014 Document Reviewed: 08/06/2013 Elsevier Interactive Patient Education  2017 Elsevier Inc.   Infeccin urinaria en los adultos (Urinary Tract Infection, Adult) Una infeccin urinaria (IU) puede ocurrir en Corporate treasurercualquier lugar de las vas urinarias. Las vas urinarias incluyen lo siguiente:  Riones.  Urteres.  Vejiga.  Uretra. Estos rganos fabrican, Barrister's clerkalmacenan y eliminan la orina del organismo. CUIDADOS EN EL HOGAR  Tome los medicamentos de venta libre y los recetados solamente como se lo haya indicado el mdico.  Si le recetaron un antibitico, tmelo como se lo haya indicado el mdico. No deje de tomar los antibiticos aunque comience a sentirse mejor.  Evite beber lo siguiente: ? Alcohol. ? Cafena. ? T. ? Bebidas con gas.  Beba suficiente lquido para mantener el pis claro o de color amarillo plido.  Concurra a todas las visitas de control como se lo haya indicado el mdico. Esto es importante.  Asegrese de lo siguiente: ? Vaciar la vejiga con frecuencia y en su totalidad. No contener la orina durante largos perodos. ? Vaciar la vejiga antes y despus de Management consultanttener relaciones sexuales. ? Limpiar de adelante hacia atrs despus de defecar, si es  mujer. Usar cada trozo de papel una vez cuando se limpie. SOLICITE AYUDA SI:  Siente dolor en la espalda.  Tiene  fiebre.  Siente malestar estomacal (nuseas).  Vomita.  Los sntomas no mejoran despus de 3das de Lake Janettratamiento.  Los sntomas desaparecen y Stage managerluego reaparecen. SOLICITE AYUDA DE INMEDIATO SI:  Siente un dolor muy intenso en la espalda.  Siente un dolor muy intenso en la parte inferior del abdomen.  Tiene vmitos y no puede retener los medicamentos ni el agua. Esta informacin no tiene Theme park managercomo fin reemplazar el consejo del mdico. Asegrese de hacerle al mdico cualquier pregunta que tenga. Document Released: 03/22/2010 Document Revised: 01/24/2016 Document Reviewed: 08/23/2015 Elsevier Interactive Patient Education  [employer]2018 Elsevier Inc.

## 2017-09-10 NOTE — Progress Notes (Signed)
Patient is here for f/up  Patient complains upper stomach pain that comes & goes

## 2017-09-11 LAB — CMP AND LIVER
ALBUMIN: 4.1 g/dL (ref 3.5–5.5)
ALK PHOS: 64 IU/L (ref 39–117)
ALT: 16 IU/L (ref 0–32)
AST: 21 IU/L (ref 0–40)
BILIRUBIN, DIRECT: 0.04 mg/dL (ref 0.00–0.40)
BUN: 7 mg/dL (ref 6–20)
Bilirubin Total: <0.2 mg/dL (ref 0.0–1.2)
CO2: 20 mmol/L (ref 20–29)
Calcium: 9.1 mg/dL (ref 8.7–10.2)
Chloride: 104 mmol/L (ref 96–106)
Creatinine, Ser: 0.5 mg/dL — ABNORMAL LOW (ref 0.57–1.00)
GFR calc Af Amer: 148 mL/min/{1.73_m2} (ref >59)
GFR calc non Af Amer: 128 mL/min/{1.73_m2} (ref >59)
Glucose: 171 mg/dL — ABNORMAL HIGH (ref 65–99)
POTASSIUM: 4.1 mmol/L (ref 3.5–5.2)
SODIUM: 140 mmol/L (ref 134–144)
Total Protein: 7.2 g/dL (ref 6.0–8.5)

## 2017-09-11 LAB — CBC
Hematocrit: 31.2 % — ABNORMAL LOW (ref 34.0–46.6)
Hemoglobin: 9.2 g/dL — ABNORMAL LOW (ref 11.1–15.9)
MCH: 19.7 pg — ABNORMAL LOW (ref 26.6–33.0)
MCHC: 29.5 g/dL — ABNORMAL LOW (ref 31.5–35.7)
MCV: 67 fL — ABNORMAL LOW (ref 79–97)
PLATELETS: 361 10*3/uL (ref 150–379)
RBC: 4.66 x10E6/uL (ref 3.77–5.28)
RDW: 18.4 % — AB (ref 12.3–15.4)
WBC: 7.2 10*3/uL (ref 3.4–10.8)

## 2017-09-11 LAB — H. PYLORI BREATH TEST: H pylori Breath Test: NEGATIVE

## 2017-09-12 NOTE — Progress Notes (Signed)
Subjective:  Patient ID: Brenda Greer, female    DOB: [birth year]  Age: 31 y.o. MRN: [id number]  CC: Diabetes   HPI Brenda Greer is a 31 y.o.female who presents for type 2 diabetes mellitus follow up and abdominal complaint. Interpreter services used. She reports epigastric pain.  Onset last Thursday.  Symptoms include nausea.  Denies any vomiting, changes to bowel or bladder habits, or poor appetite.  History of positive H. pylori.  She reports completing treatment.  She reports use of omeprazole daily.  Diabetes Mellitus:  Symptoms: none.Patient denies foot ulcerations, hyperglycemia, nausea, paresthesia of the feet and visual disturbances.  Evaluation to date has been included: fasting blood sugar, fasting lipid panel, hemoglobin A1C and microalbuminuria.  Home sugars: BGs are running  consistent with Hgb A1C.  She reports adherence to all diabetic medications except Januvia due to medication cost.   Outpatient Medications Prior to Visit  Medication Sig Dispense Refill  . glucose blood (TRUETRACK TEST) test strip Use as instructed for daily testing of blood glucose. E11.9 100 each 12  . glucose blood (WAVESENSE PRESTO) test strip Use as instructed for daily testing of blood glucose. E11.9 100 each 12  . Lancet Devices (ACCU-CHEK SOFTCLIX) lancets Use as instructed 1 each 0  . Multiple Vitamin (MULTIVITAMIN WITH MINERALS) TABS Take 1 tablet by mouth daily.    . pravastatin (PRAVACHOL) 20 MG tablet TAKE 1 TABLET BY MOUTH ONCE DAILY 90 tablet 0  . glipiZIDE (GLUCOTROL) 10 MG tablet Take 1 tablet (10 mg total) by mouth 2 (two) times daily before a meal. 60 tablet 2  . metFORMIN (GLUCOPHAGE XR) 500 MG 24 hr tablet Take 2 tablets (1,000 mg total) by mouth 2 (two) times daily with a meal. 120 tablet 3  . omeprazole (PRILOSEC) 20 MG capsule Take 1 capsule (20 mg total) by mouth daily. 30 capsule 2  . sitaGLIPtin (JANUVIA) 100 MG tablet Take 1 tablet (100 mg total) by mouth  daily. 30 tablet 2   No facility-administered medications prior to visit.     ROS Review of Systems  Constitutional: Negative for appetite change, fever and unexpected weight change.  HENT: Negative.   Eyes: Negative.   Respiratory: Negative.   Cardiovascular: Negative.   Gastrointestinal: Positive for abdominal pain and vomiting.       Heartburn  Endocrine: Negative.   Genitourinary: Negative.   Skin: Negative.     Objective:  BP 119/75 (BP Location: Left Arm, Patient Position: Sitting, Cuff Size: Normal)   Pulse 89   Temp 98.6 F (37 C) (Oral)   Resp 18   Ht 5' (1.524 m)   Wt 161 lb 12.8 oz (73.4 kg)   SpO2 100%   BMI 31.60 kg/m   BP/Weight 09/10/2017 05/07/2017 04/09/2017  Systolic BP 119 114 104  Diastolic BP 75 76 69  Wt. (Lbs) 161.8 160.4 162.6  BMI 31.6 31.33 31.76     Physical Exam  Constitutional: She appears well-developed and well-nourished.  HENT:  Head: Normocephalic and atraumatic.  Right Ear: External ear normal.  Left Ear: External ear normal.  Nose: Nose normal.  Mouth/Throat: Oropharynx is clear and moist.  Eyes: Conjunctivae are normal. Pupils are equal, round, and reactive to light.  Neck: Normal range of motion. Neck supple. No thyromegaly present.  Cardiovascular: Normal rate, regular rhythm, normal heart sounds and intact distal pulses.  Pulmonary/Chest: Effort normal and breath sounds normal.  Abdominal: Soft. Bowel sounds are normal. There is tenderness (epigastric ).  Lymphadenopathy:  She has no cervical adenopathy.  Skin: Skin is warm and dry.  Psychiatric: She has a normal mood and affect.  Nursing note and vitals reviewed.   Assessment & Plan:   1. Uncontrolled type 2 diabetes mellitus with hyperglycemia, without long-term current use of insulin (HCC) Januvia prescription  sent to [facility] due to medication cost. - Glucose (CBG) - HgB A1c - Ambulatory referral to Podiatry - metFORMIN (GLUCOPHAGE XR) 500 MG 24 hr  tablet; Take 2 tablets (1,000 mg total) by mouth 2 (two) times daily with a meal.  Dispense: 120 tablet; Refill: 3 - glipiZIDE (GLUCOTROL) 10 MG tablet; Take 1 tablet (10 mg total) by mouth 2 (two) times daily before a meal.  Dispense: 60 tablet; Refill: 3 - sitaGLIPtin (JANUVIA) 100 MG tablet; Take 1 tablet (100 mg total) by mouth daily.  Dispense: 30 tablet; Refill: 3  2. Epigastric pain  - H. pylori breath test - POCT urinalysis dipstick - POCT urine pregnancy - CMP and Liver - CBC - pantoprazole (PROTONIX) 20 MG tablet; Take 1 tablet (20 mg total) by mouth daily.  Dispense: 30 tablet; Refill: 3  3. Onychomycosis  - Ambulatory referral to Podiatry  4. Gastroesophageal reflux disease without esophagitis  - pantoprazole (PROTONIX) 20 MG tablet; Take 1 tablet (20 mg total) by mouth daily.  Dispense: 30 tablet; Refill: 3  5. Acute cystitis without hematuria  - nitrofurantoin, macrocrystal-monohydrate, (MACROBID) 100 MG capsule; Take 1 capsule (100 mg total) by mouth 2 (two) times daily.  Dispense: 10 capsule; Refill: 0     Follow-up: Return in about 3 months (around 12/11/2017), or if symptoms worsen or fail to improve, for DM.   Lizbeth BarkMandesia R Vishruth Seoane FNP

## 2017-09-18 ENCOUNTER — Telehealth: Payer: Self-pay

## 2017-09-18 ENCOUNTER — Other Ambulatory Visit: Payer: Self-pay | Admitting: Family Medicine

## 2017-09-18 DIAGNOSIS — Z8619 Personal history of other infectious and parasitic diseases: Secondary | ICD-10-CM

## 2017-09-18 DIAGNOSIS — D509 Iron deficiency anemia, unspecified: Secondary | ICD-10-CM

## 2017-09-18 DIAGNOSIS — R1013 Epigastric pain: Secondary | ICD-10-CM

## 2017-09-18 MED ORDER — FERROUS SULFATE 325 (65 FE) MG PO TBEC: 325.0000 mg | DELAYED_RELEASE_TABLET | Freq: Three times a day (TID) | ORAL | 2 refills | Status: DC

## 2017-09-18 NOTE — Telephone Encounter (Signed)
-----   Message from Lizbeth BarkMandesia R Hairston, FNP sent at 09/18/2017  9:03 AM EST ----- -H.pylori is negative. H.pylori is a bacteria that can infect the stomach and cause stomach ulcers. Liver function normal Kidney function normal Blood cells indicate iron deficiency anemia.You will be prescribed iron supplements. Increase fiber and water intake to prevent constipation. Recommend f/u in 3 months. -Increase your dietary iron intake. Good sources of iron include dark green leafy vegetables, meats, beans, and iron fortified cereals. You will be referred to GI.

## 2017-09-18 NOTE — Telephone Encounter (Signed)
CMA call regarding lab results   Patient did not answer & unable to leave message VM was full 

## 2017-11-08 ENCOUNTER — Other Ambulatory Visit: Payer: Self-pay | Admitting: Family Medicine

## 2017-11-08 DIAGNOSIS — A048 Other specified bacterial intestinal infections: Secondary | ICD-10-CM

## 2017-12-10 ENCOUNTER — Other Ambulatory Visit: Payer: Self-pay | Admitting: Family Medicine

## 2017-12-10 DIAGNOSIS — E1165 Type 2 diabetes mellitus with hyperglycemia: Secondary | ICD-10-CM

## 2017-12-13 ENCOUNTER — Ambulatory Visit: Payer: Commercial Managed Care - PPO | Admitting: Family Medicine

## 2018-01-07 ENCOUNTER — Ambulatory Visit: Payer: Commercial Managed Care - PPO | Admitting: Nurse Practitioner

## 2018-01-08 ENCOUNTER — Other Ambulatory Visit: Payer: Self-pay | Admitting: Pharmacist

## 2018-01-08 DIAGNOSIS — R1013 Epigastric pain: Secondary | ICD-10-CM

## 2018-01-08 DIAGNOSIS — K219 Gastro-esophageal reflux disease without esophagitis: Secondary | ICD-10-CM

## 2018-01-08 MED ORDER — PANTOPRAZOLE SODIUM 20 MG PO TBEC: 20.0000 mg | DELAYED_RELEASE_TABLET | Freq: Every day | ORAL | 0 refills | Status: DC

## 2018-01-21 ENCOUNTER — Telehealth: Payer: Self-pay | Admitting: Family Medicine

## 2018-01-21 DIAGNOSIS — E1165 Type 2 diabetes mellitus with hyperglycemia: Secondary | ICD-10-CM

## 2018-01-21 NOTE — Telephone Encounter (Signed)
Pt. Called requesting a refill on glipiZIDE (GLUCOTROL) 10 MG tablet Pt. [facility] [facility] [address] Pt. Has scheduled an appt. For 02/26/18.  Please f/u

## 2018-01-24 MED ORDER — GLIPIZIDE 10 MG PO TABS: ORAL_TABLET | ORAL | 1 refills | Status: DC

## 2018-01-24 NOTE — Telephone Encounter (Signed)
She has already been given a courtesy fill, it will need approval by the provider. She is establishing with Dr. Laural BenesJohnson, will forward to her.

## 2018-02-26 ENCOUNTER — Other Ambulatory Visit: Payer: Self-pay

## 2018-02-26 ENCOUNTER — Ambulatory Visit: Payer: Commercial Managed Care - PPO | Attending: Internal Medicine | Admitting: Internal Medicine

## 2018-02-26 ENCOUNTER — Encounter: Payer: Self-pay | Admitting: Internal Medicine

## 2018-02-26 VITALS — BP 120/75 | HR 95 | Temp 98.0°F | Resp 16 | Ht 60.5 in | Wt 158.0 lb

## 2018-02-26 DIAGNOSIS — Z79899 Other long term (current) drug therapy: Secondary | ICD-10-CM | POA: Diagnosis not present

## 2018-02-26 DIAGNOSIS — E785 Hyperlipidemia, unspecified: Secondary | ICD-10-CM | POA: Diagnosis not present

## 2018-02-26 DIAGNOSIS — K219 Gastro-esophageal reflux disease without esophagitis: Secondary | ICD-10-CM | POA: Diagnosis not present

## 2018-02-26 DIAGNOSIS — B351 Tinea unguium: Secondary | ICD-10-CM | POA: Diagnosis not present

## 2018-02-26 DIAGNOSIS — Z8249 Family history of ischemic heart disease and other diseases of the circulatory system: Secondary | ICD-10-CM | POA: Diagnosis not present

## 2018-02-26 DIAGNOSIS — E1165 Type 2 diabetes mellitus with hyperglycemia: Secondary | ICD-10-CM | POA: Insufficient documentation

## 2018-02-26 DIAGNOSIS — D509 Iron deficiency anemia, unspecified: Secondary | ICD-10-CM | POA: Diagnosis not present

## 2018-02-26 DIAGNOSIS — Z7689 Persons encountering health services in other specified circumstances: Secondary | ICD-10-CM | POA: Diagnosis present

## 2018-02-26 DIAGNOSIS — E1142 Type 2 diabetes mellitus with diabetic polyneuropathy: Secondary | ICD-10-CM

## 2018-02-26 DIAGNOSIS — E119 Type 2 diabetes mellitus without complications: Secondary | ICD-10-CM | POA: Diagnosis not present

## 2018-02-26 DIAGNOSIS — IMO0002 Reserved for concepts with insufficient information to code with codable children: Secondary | ICD-10-CM

## 2018-02-26 LAB — GLUCOSE, POCT (MANUAL RESULT ENTRY): POC Glucose: 319 mg/dl — AB (ref 70–99)

## 2018-02-26 MED ORDER — TRUEPLUS LANCETS 28G MISC: 12 refills | Status: DC

## 2018-02-26 MED ORDER — GABAPENTIN 300 MG PO CAPS: 300.0000 mg | ORAL_CAPSULE | Freq: Every day | ORAL | 6 refills | Status: DC

## 2018-02-26 MED ORDER — GLUCOSE BLOOD VI STRP: ORAL_STRIP | 12 refills | Status: DC

## 2018-02-26 MED ORDER — TRUE METRIX METER W/DEVICE KIT: PACK | 0 refills | Status: DC

## 2018-02-26 MED ORDER — METFORMIN HCL ER 500 MG PO TB24: 1000.0000 mg | ORAL_TABLET | Freq: Two times a day (BID) | ORAL | 6 refills | Status: DC

## 2018-02-26 MED ORDER — TRUEPLUS LANCETS 28G MISC: 6 refills | Status: DC

## 2018-02-26 MED ORDER — GLIPIZIDE 10 MG PO TABS: ORAL_TABLET | ORAL | 6 refills | Status: DC

## 2018-02-26 MED ORDER — FERROUS SULFATE 325 (65 FE) MG PO TBEC: 325.0000 mg | DELAYED_RELEASE_TABLET | Freq: Every day | ORAL | 1 refills | Status: DC

## 2018-02-26 MED ORDER — PRAVASTATIN SODIUM 20 MG PO TABS: 20.0000 mg | ORAL_TABLET | Freq: Every day | ORAL | 6 refills | Status: DC

## 2018-02-26 MED FILL — TRUE METRIX TEST STRIP: 30 days supply | Qty: 100 | Fill #0

## 2018-02-26 MED FILL — !TRUE METRIX BLOOD GLUCOSE: 365 days supply | Qty: 1 | Fill #0

## 2018-02-26 NOTE — Addendum Note (Signed)
Addended by: Carolynne Edouard R on: 02/26/2018 10:49 AM   Modules accepted: Orders

## 2018-02-26 NOTE — Progress Notes (Signed)
Patient ID: Brenda Greer, female    DOB: 28-Oct-1985  MRN: [id number]  CC: re-establish and Diabetes   Subjective: Brenda Greer is a 32 y.o. female who presents for chronic ds management.  Previous PCP was Hairston Her concerns today include:  Pt has DM, HL, GERD,  Iron def anemia  Anemia;  Anemia since 2014 from review of chart.  However, pt states she was not aware and CMA was not able to reach her after last visit to give results and recommendation of starting iron supplement.  Therefore, never got iron supplement -menses heavy and last 4 days.  DM:  Out of Glipizide and Metformin x 2 wks.  Pt states she called pharmacy and was told she did not have RF. Was never able to afford Januvia.  Not checking BS because she lost her glucometer.  Eating habits: doing okay with eating habits.  She avoids things that elev BS Exercise: none. No blurred vision. Paresthesia on sole of  feet at nights. "Like ants walking on my feet."  Bothersome to her.   GERD:  She feels she no longer needs the Pantoprazole.  Patient Active Problem List   Diagnosis Date Noted  . Hyperlipidemia 02/26/2018  . Gastroesophageal reflux disease without esophagitis 02/26/2018  . Diabetes mellitus without complication ([place]) 17/40/8144  . Type II or unspecified type diabetes mellitus without mention of complication, uncontrolled 05/19/2013  . OBESITY, NOS 12/13/2006     Current Outpatient Medications on File Prior to Visit  Medication Sig Dispense Refill  . Multiple Vitamin (MULTIVITAMIN WITH MINERALS) TABS Take 1 tablet by mouth daily.    . pantoprazole (PROTONIX) 20 MG tablet Take 1 tablet (20 mg total) by mouth daily. 30 tablet 0   No current facility-administered medications on file prior to visit.     No Known Allergies  Social History   Socioeconomic History  . Marital status: Single    Spouse name: Not on file  . Number of children: Not on file  . Years of education: Not on  file  . Highest education level: Not on file  Occupational History  . Not on file  Social Needs  . Financial resource strain: Not on file  . Food insecurity:    Worry: Not on file    Inability: Not on file  . Transportation needs:    Medical: Not on file    Non-medical: Not on file  Tobacco Use  . Smoking status: Never Smoker  . Smokeless tobacco: Never Used  Substance and Sexual Activity  . Alcohol use: No  . Drug use: No  . Sexual activity: Not on file  Lifestyle  . Physical activity:    Days per week: Not on file    Minutes per session: Not on file  . Stress: Not on file  Relationships  . Social connections:    Talks on phone: Not on file    Gets together: Not on file    Attends religious service: Not on file    Active member of club or organization: Not on file    Attends meetings of clubs or organizations: Not on file    Relationship status: Not on file  . Intimate partner violence:    Fear of current or ex partner: Not on file    Emotionally abused: Not on file    Physically abused: Not on file    Forced sexual activity: Not on file  Other Topics Concern  . Not on file  Social History  Narrative  . Not on file    Family History  Problem Relation Age of Onset  . Diabetes Mother   . Hypertension Mother     No past surgical history on file.  ROS: Review of Systems  PHYSICAL EXAM: BP 120/75   Pulse 95   Temp 98 F (36.7 C) (Oral)   Resp 16   Ht 5' 0.5" (1.537 m)   Wt 158 lb (71.7 kg)   SpO2 100%   BMI 30.35 kg/m   Wt Readings from Last 3 Encounters:  02/26/18 158 lb (71.7 kg)  09/10/17 161 lb 12.8 oz (73.4 kg)  05/07/17 160 lb 6.4 oz (72.8 kg)    Physical Exam  General appearance - alert, well appearing, and in no distress Mental status - alert, oriented to person, place, and time, normal mood, behavior, speech, dress, motor activity, and thought processes Eyes - pupils equal and reactive, extraocular eye movements intact Mouth - mucous  membranes moist, pharynx normal without lesions Neck - supple, no significant adenopathy Chest - clear to auscultation, no wheezes, rales or rhonchi, symmetric air entry Heart - normal rate, regular rhythm, normal S1, S2, no murmurs, rubs, clicks or gallops Extremities - peripheral pulses normal, no pedal edema, no clubbing or cyanosis Diabetic Foot Exam - Simple   Simple Foot Form Visual Inspection No deformities, no ulcerations, no other skin breakdown bilaterally:  Yes Sensation Testing See comments:  Yes Pulse Check Posterior Tibialis and Dorsalis pulse intact bilaterally:  Yes Comments Onychomycosis of all nails.  Decrease sensation to touch on lateral surface sole       Chemistry      Component Value Date/Time   NA 140 09/10/2017 1558   K 4.1 09/10/2017 1558   CL 104 09/10/2017 1558   CO2 20 09/10/2017 1558   BUN 7 09/10/2017 1558   CREATININE 0.50 (L) 09/10/2017 1558   CREATININE 0.60 05/19/2013 1703      Component Value Date/Time   CALCIUM 9.1 09/10/2017 1558   ALKPHOS 64 09/10/2017 1558   AST 21 09/10/2017 1558   ALT 16 09/10/2017 1558   BILITOT <0.2 09/10/2017 1558     Lab Results  Component Value Date   CHOL 202 (H) 04/09/2017   HDL 57 04/09/2017   LDLCALC 112 (H) 04/09/2017   TRIG 164 (H) 04/09/2017   CHOLHDL 3.5 04/09/2017    Results for orders placed or performed in visit on 02/26/18  POCT glucose (manual entry)  Result Value Ref Range   POC Glucose 319 (A) 70 - 99 mg/dl   Lab Results  Component Value Date   HGBA1C 8.7 09/10/2017    ASSESSMENT AND PLAN: 1. Uncontrolled type 2 diabetes mellitus with peripheral neuropathy ([place]) -pt out of oral meds x 2 wks.  RF done Discussed the importance of healthy eating habits, regular aerobic exercise (at least 150 minutes a week as tolerated) and medication compliance to achieve or maintain control of diabetes. - POCT glucose (manual entry) - Hemoglobin A1c - glipiZIDE (GLUCOTROL) 10 MG tablet; TAKE 1  TABLET BY MOUTH TWICE DAILY BEFORE MEAL(S)  Dispense: 60 tablet; Refill: 6 - metFORMIN (GLUCOPHAGE XR) 500 MG 24 hr tablet; Take 2 tablets (1,000 mg total) by mouth 2 (two) times daily with a meal.  Dispense: 120 tablet; Refill: 6 - gabapentin (NEURONTIN) 300 MG capsule; Take 1 capsule (300 mg total) by mouth at bedtime.  Dispense: 30 capsule; Refill: 6 - Vitamin B12 - Blood Glucose Monitoring Suppl (TRUE METRIX METER) w/Device  KIT; Use as directed  Dispense: 1 kit; Refill: 0 - glucose blood (TRUE METRIX BLOOD GLUCOSE TEST) test strip; Use as instructed  Dispense: 100 each; Refill: 12 - TRUEPLUS LANCETS 28G MISC; Use as directed  Dispense: 100 each; Refill: 6  2. Gastroesophageal reflux disease without esophagitis -no longer an issue.  Pantoprazole taken off med list  3. Microcytic hypochromic anemia Check iron studies today - ferrous sulfate 325 (65 FE) MG EC tablet; Take 1 tablet (325 mg total) by mouth daily with breakfast.  Dispense: 100 tablet; Refill: 1 - Iron, TIBC and Ferritin Panel - Hematocrit  4. Hyperlipidemia, unspecified hyperlipidemia type - pravastatin (PRAVACHOL) 20 MG tablet; Take 1 tablet (20 mg total) by mouth daily.  Dispense: 30 tablet; Refill: 6   Patient was given the opportunity to ask questions.  Patient verbalized understanding of the plan and was able to repeat key elements of the plan.   Orders Placed This Encounter  Procedures  . Hemoglobin A1c  . Iron, TIBC and Ferritin Panel  . Vitamin B12  . Hematocrit  . POCT glucose (manual entry)     Requested Prescriptions   Signed Prescriptions Disp Refills  . glipiZIDE (GLUCOTROL) 10 MG tablet 60 tablet 6    Sig: TAKE 1 TABLET BY MOUTH TWICE DAILY BEFORE MEAL(S)  . metFORMIN (GLUCOPHAGE XR) 500 MG 24 hr tablet 120 tablet 6    Sig: Take 2 tablets (1,000 mg total) by mouth 2 (two) times daily with a meal.  . ferrous sulfate 325 (65 FE) MG EC tablet 100 tablet 1    Sig: Take 1 tablet (325 mg total) by mouth  daily with breakfast.  . gabapentin (NEURONTIN) 300 MG capsule 30 capsule 6    Sig: Take 1 capsule (300 mg total) by mouth at bedtime.  . pravastatin (PRAVACHOL) 20 MG tablet 30 tablet 6    Sig: Take 1 tablet (20 mg total) by mouth daily.  . Blood Glucose Monitoring Suppl (TRUE METRIX METER) w/Device KIT 1 kit 0    Sig: Use as directed  . glucose blood (TRUE METRIX BLOOD GLUCOSE TEST) test strip 100 each 12    Sig: Use as instructed  . TRUEPLUS LANCETS 28G MISC 100 each 6    Sig: Use as directed    Return in about 3 months (around 05/29/2018).  Karle Plumber, MD, FACP

## 2018-02-27 LAB — IRON,TIBC AND FERRITIN PANEL
FERRITIN: 8 ng/mL — AB (ref 15–150)
Iron Saturation: 3 % — CL (ref 15–55)
Iron: 12 ug/dL — ABNORMAL LOW (ref 27–159)
TIBC: 389 ug/dL (ref 250–450)
UIBC: 377 ug/dL (ref 131–425)

## 2018-02-27 LAB — HEMOGLOBIN A1C
Est. average glucose Bld gHb Est-mCnc: 260 mg/dL
Hgb A1c MFr Bld: 10.7 % — ABNORMAL HIGH (ref 4.8–5.6)

## 2018-02-27 LAB — VITAMIN B12: Vitamin B-12: 411 pg/mL (ref 232–1245)

## 2018-02-27 LAB — HEMATOCRIT: Hematocrit: 31 % — ABNORMAL LOW (ref 34.0–46.6)

## 2018-02-28 ENCOUNTER — Telehealth: Payer: Self-pay

## 2018-02-28 NOTE — Telephone Encounter (Signed)
-----   Message from Particia Lather, [place] sent at 02/28/2018  1:56 PM EDT -----   ----- Message ----- From: Marcine Matar, MD Sent: 02/27/2018  11:52 AM To: Particia Lather, RMA  Please let pt know that her A1C, which indicates level of control of DM for previous 3 mths was 10.7 up from 8.7 5 mths ago.  Goal is to be less than 7.  She should take her DM medicine every day as prescribed.  She is also still anemic and needs to take the iron supplement daily.  I had sent the prescription to pharmacy when I saw her yesterday.

## 2018-02-28 NOTE — Telephone Encounter (Signed)
CMA call regarding lab results   Patient Verify DOB   Patient was aware and understood  

## 2018-05-30 ENCOUNTER — Ambulatory Visit: Payer: Commercial Managed Care - PPO | Admitting: Internal Medicine

## 2018-07-02 ENCOUNTER — Encounter: Payer: Self-pay | Admitting: Pharmacist

## 2018-07-02 ENCOUNTER — Ambulatory Visit (HOSPITAL_BASED_OUTPATIENT_CLINIC_OR_DEPARTMENT_OTHER): Payer: Commercial Managed Care - PPO | Admitting: Pharmacist

## 2018-07-02 ENCOUNTER — Ambulatory Visit: Payer: Commercial Managed Care - PPO | Attending: Internal Medicine | Admitting: Internal Medicine

## 2018-07-02 ENCOUNTER — Encounter: Payer: Self-pay | Admitting: Internal Medicine

## 2018-07-02 VITALS — BP 115/78 | HR 99 | Temp 98.3°F | Resp 16 | Wt 159.6 lb

## 2018-07-02 DIAGNOSIS — E1165 Type 2 diabetes mellitus with hyperglycemia: Secondary | ICD-10-CM | POA: Insufficient documentation

## 2018-07-02 DIAGNOSIS — Z2821 Immunization not carried out because of patient refusal: Secondary | ICD-10-CM

## 2018-07-02 DIAGNOSIS — Z6830 Body mass index (BMI) 30.0-30.9, adult: Secondary | ICD-10-CM | POA: Insufficient documentation

## 2018-07-02 DIAGNOSIS — E785 Hyperlipidemia, unspecified: Secondary | ICD-10-CM | POA: Insufficient documentation

## 2018-07-02 DIAGNOSIS — K219 Gastro-esophageal reflux disease without esophagitis: Secondary | ICD-10-CM | POA: Insufficient documentation

## 2018-07-02 DIAGNOSIS — Z794 Long term (current) use of insulin: Secondary | ICD-10-CM | POA: Insufficient documentation

## 2018-07-02 DIAGNOSIS — E669 Obesity, unspecified: Secondary | ICD-10-CM | POA: Insufficient documentation

## 2018-07-02 DIAGNOSIS — E119 Type 2 diabetes mellitus without complications: Secondary | ICD-10-CM | POA: Diagnosis not present

## 2018-07-02 DIAGNOSIS — IMO0001 Reserved for inherently not codable concepts without codable children: Secondary | ICD-10-CM

## 2018-07-02 DIAGNOSIS — D500 Iron deficiency anemia secondary to blood loss (chronic): Secondary | ICD-10-CM | POA: Insufficient documentation

## 2018-07-02 DIAGNOSIS — Z79899 Other long term (current) drug therapy: Secondary | ICD-10-CM | POA: Insufficient documentation

## 2018-07-02 LAB — POCT GLYCOSYLATED HEMOGLOBIN (HGB A1C): HbA1c, POC (controlled diabetic range): 10.1 % — AB (ref 0.0–7.0)

## 2018-07-02 LAB — GLUCOSE, POCT (MANUAL RESULT ENTRY): POC GLUCOSE: 226 mg/dL — AB (ref 70–99)

## 2018-07-02 MED ORDER — INSULIN GLARGINE 100 UNIT/ML SOLOSTAR PEN: 8.0000 [IU] | PEN_INJECTOR | Freq: Every day | SUBCUTANEOUS | 11 refills | Status: DC

## 2018-07-02 MED ORDER — PEN NEEDLES 31G X 6 MM MISC: 6 refills | Status: DC

## 2018-07-02 NOTE — Progress Notes (Signed)
Patient ID: Brenda Greer, female    DOB: 06-21-1986  MRN: [id number]  CC: No chief complaint on file.   Subjective: Brenda Greer is a 32 y.o. female who presents for chronic ds management.  Her concerns today include:  Pt has DM, HL, GERD,  Iron def anemia  Anemia:  Iron studies c/w IDA.  She has started iron supplement daily as recommended  DM:  Checks BS in the mornings.  Gives range 200-230.  Meds:  Confirms that she is taking Metformin and Glipizide Eating habits:  She tries to avoid foods that will elevated BS.  Drinks water and diet sodas.   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   Notice a spot on RT lower eye lid x 1 wk.  Itches.  No blurred vision.  Never had eye exam   Patient Active Problem List   Diagnosis Date Noted  . Hyperlipidemia 02/26/2018  . Gastroesophageal reflux disease without esophagitis 02/26/2018  . Diabetes mellitus without complication ([place]) 14/43/1540  . Type II or unspecified type diabetes mellitus without mention of complication, uncontrolled 05/19/2013  . OBESITY, NOS 12/13/2006     Current Outpatient Medications on File Prior to Visit  Medication Sig Dispense Refill  . Blood Glucose Monitoring Suppl (TRUE METRIX METER) w/Device KIT Use as directed 1 kit 0  . Blood Glucose Monitoring Suppl (TRUE METRIX METER) w/Device KIT Use as directed 1 kit 0  . ferrous sulfate 325 (65 FE) MG EC tablet Take 1 tablet (325 mg total) by mouth daily with breakfast. 100 tablet 1  . gabapentin (NEURONTIN) 300 MG capsule Take 1 capsule (300 mg total) by mouth at bedtime. 30 capsule 6  . glipiZIDE (GLUCOTROL) 10 MG tablet TAKE 1 TABLET BY MOUTH TWICE DAILY BEFORE MEAL(S) 60 tablet 6  . glucose blood (TRUE METRIX BLOOD GLUCOSE TEST) test strip Use as instructed 100 each 12  . metFORMIN (GLUCOPHAGE XR) 500 MG 24 hr tablet Take 2 tablets (1,000 mg total) by mouth 2  (two) times daily with a meal. 120 tablet 6  . Multiple Vitamin (MULTIVITAMIN WITH MINERALS) TABS Take 1 tablet by mouth daily.    . pantoprazole (PROTONIX) 20 MG tablet Take 1 tablet (20 mg total) by mouth daily. 30 tablet 0  . pravastatin (PRAVACHOL) 20 MG tablet Take 1 tablet (20 mg total) by mouth daily. 30 tablet 6  . TRUEPLUS LANCETS 28G MISC Use as directed 100 each 6  . TRUEPLUS LANCETS 28G MISC Use as directed 100 each 12   No current facility-administered medications on file prior to visit.     No Known Allergies  Social History   Socioeconomic History  . Marital status: Single    Spouse name: Not on file  . Number of children: Not on file  . Years of education: Not on file  . Highest education level: Not on file  Occupational History  . Not on file  Social Needs  . Financial resource strain: Not on file  . Food insecurity:    Worry: Not on file    Inability: Not on file  . Transportation needs:    Medical: Not on file    Non-medical: Not on file  Tobacco Use  . Smoking status: Never Smoker  . Smokeless tobacco: Never Used  Substance and Sexual Activity  . Alcohol use: No  . Drug use: No  . Sexual activity:  Not on file  Lifestyle  . Physical activity:    Days per week: Not on file    Minutes per session: Not on file  . Stress: Not on file  Relationships  . Social connections:    Talks on phone: Not on file    Gets together: Not on file    Attends religious service: Not on file    Active member of club or organization: Not on file    Attends meetings of clubs or organizations: Not on file    Relationship status: Not on file  . Intimate partner violence:    Fear of current or ex partner: Not on file    Emotionally abused: Not on file    Physically abused: Not on file    Forced sexual activity: Not on file  Other Topics Concern  . Not on file  Social History Narrative  . Not on file    Family History  Problem Relation Age of Onset  . Diabetes  Mother   . Hypertension Mother     No past surgical history on file.  ROS: Review of Systems Negative except as stated above PHYSICAL EXAM: BP 115/78   Pulse 99   Temp 98.3 F (36.8 C) (Oral)   Resp 16   Wt 159 lb 9.6 oz (72.4 kg)   SpO2 100%   BMI 30.66 kg/m   Wt Readings from Last 3 Encounters:  07/02/18 159 lb 9.6 oz (72.4 kg)  02/26/18 158 lb (71.7 kg)  09/10/17 161 lb 12.8 oz (73.4 kg)    Physical Exam  General appearance - alert, well appearing, and in no distress Mental status - normal mood, behavior, speech, dress, motor activity, and thought processes Eyes: No lesions noted on the eyelids.  Pink conjunctiva.  Extraocular movements intact. Neck - supple, no significant adenopathy Chest - clear to auscultation, no wheezes, rales or rhonchi, symmetric air entry Heart - normal rate, regular rhythm, normal S1, S2, no murmurs, rubs, clicks or gallops Extremities - peripheral pulses normal, no pedal edema, no clubbing or cyanosis   Results for orders placed or performed in visit on 07/02/18  POCT glucose (manual entry)  Result Value Ref Range   POC Glucose 226 (A) 70 - 99 mg/dl  POCT glycosylated hemoglobin (Hb A1C)  Result Value Ref Range   Hemoglobin A1C     HbA1c POC (<> result, manual entry)     HbA1c, POC (prediabetic range)     HbA1c, POC (controlled diabetic range) 10.1 (A) 0.0 - 7.0 %    ASSESSMENT AND PLAN: 1. Diabetes mellitus type 2, uncontrolled, without complications (HCC) Discussed the importance of healthy eating habits, regular aerobic exercise (at least 150 minutes a week as tolerated) and medication compliance to achieve or maintain control of diabetes. I recommend adding Lantus 8 units at bedtime.  Patient was agreeable to this.  Clinical pharmacist did teaching with her. - POCT glucose (manual entry) - POCT glycosylated hemoglobin (Hb A1C) - Microalbumin / creatinine urine ratio - Insulin Glargine (LANTUS SOLOSTAR) 100 UNIT/ML Solostar Pen;  Inject 8 Units into the skin daily.  Dispense: 15 mL; Refill: 11 - Insulin Pen Needle (PEN NEEDLES) 31G X 6 MM MISC; Use as directed  Dispense: 100 each; Refill: 6  2. Iron deficiency anemia due to chronic blood loss Continue iron supplement  3. Influenza vaccination declined   Patient was given the opportunity to ask questions.  Patient verbalized understanding of the plan and was able to repeat key  elements of the plan.  [employer] interpreter used during this encounter. #[id number]  Orders Placed This Encounter  Procedures  . Microalbumin / creatinine urine ratio  . POCT glucose (manual entry)  . POCT glycosylated hemoglobin (Hb A1C)     Requested Prescriptions   Signed Prescriptions Disp Refills  . Insulin Glargine (LANTUS SOLOSTAR) 100 UNIT/ML Solostar Pen 15 mL 11    Sig: Inject 8 Units into the skin daily.  . Insulin Pen Needle (PEN NEEDLES) 31G X 6 MM MISC 100 each 6    Sig: Use as directed    Return in about 3 months (around 10/01/2018).  Karle Plumber, MD, FACP

## 2018-07-02 NOTE — Progress Notes (Signed)
Pt states her right eye gets painful and red

## 2018-07-02 NOTE — Progress Notes (Signed)
Patient was educated on the use of the Lantus pen. Reviewed necessary supplies and operation of the pen. Also reviewed goal blood glucose levels. Patient was able to demonstrate use. All questions and concerns were addressed.  

## 2018-07-03 LAB — MICROALBUMIN / CREATININE URINE RATIO
Creatinine, Urine: 118.1 mg/dL
MICROALB/CREAT RATIO: 11.3 mg/g{creat} (ref 0.0–30.0)
MICROALBUM., U, RANDOM: 13.3 ug/mL

## 2018-10-01 ENCOUNTER — Other Ambulatory Visit: Payer: Self-pay | Admitting: Internal Medicine

## 2018-10-01 DIAGNOSIS — E1165 Type 2 diabetes mellitus with hyperglycemia: Principal | ICD-10-CM

## 2018-10-01 DIAGNOSIS — E1142 Type 2 diabetes mellitus with diabetic polyneuropathy: Secondary | ICD-10-CM

## 2018-10-01 DIAGNOSIS — E785 Hyperlipidemia, unspecified: Secondary | ICD-10-CM

## 2018-10-01 DIAGNOSIS — IMO0002 Reserved for concepts with insufficient information to code with codable children: Secondary | ICD-10-CM

## 2018-10-03 ENCOUNTER — Ambulatory Visit: Payer: Commercial Managed Care - PPO | Admitting: Internal Medicine

## 2018-10-03 ENCOUNTER — Telehealth: Payer: Self-pay | Admitting: Internal Medicine

## 2018-10-03 DIAGNOSIS — E1165 Type 2 diabetes mellitus with hyperglycemia: Principal | ICD-10-CM

## 2018-10-03 DIAGNOSIS — E1142 Type 2 diabetes mellitus with diabetic polyneuropathy: Secondary | ICD-10-CM

## 2018-10-03 DIAGNOSIS — IMO0002 Reserved for concepts with insufficient information to code with codable children: Secondary | ICD-10-CM

## 2018-10-03 DIAGNOSIS — R1013 Epigastric pain: Secondary | ICD-10-CM

## 2018-10-03 DIAGNOSIS — K219 Gastro-esophageal reflux disease without esophagitis: Secondary | ICD-10-CM

## 2018-10-03 NOTE — Telephone Encounter (Signed)
1) Medication(s) Requested (by name): -gabapentin (NEURONTIN) 300 MG capsule  -glipiZIDE (GLUCOTROL) 10 MG tablet  -metFORMIN (GLUCOPHAGE XR) 500 MG 24 hr tablet  -pantoprazole (PROTONIX) 20 MG tablet  -pravastatin (PRAVACHOL) 20 MG tablet  - 2) Pharmacy of Choice: -[facility] [place] - [id number] 3) Special Requests:   Approved medications will be sent to the pharmacy, we will reach out if there is an issue.  Requests made after 3pm may not be addressed until the following business day!  If a patient is unsure of the name of the medication(s) please note and ask patient to call back when they are able to provide all info, do not send to responsible party until all information is available!

## 2018-10-04 MED ORDER — METFORMIN HCL ER 500 MG PO TB24: 1000.0000 mg | ORAL_TABLET | Freq: Two times a day (BID) | ORAL | 2 refills | Status: DC

## 2018-10-04 MED ORDER — PANTOPRAZOLE SODIUM 20 MG PO TBEC: 20.0000 mg | DELAYED_RELEASE_TABLET | Freq: Every day | ORAL | 2 refills | Status: DC

## 2018-10-04 NOTE — Telephone Encounter (Signed)
Glipizide and Pravastatin were authorized earlier today, metformin and pantoprazole were authorized and gabapentin was sent to provider via the pharmacy's electronic request

## 2018-11-07 ENCOUNTER — Ambulatory Visit: Payer: Commercial Managed Care - PPO | Attending: Internal Medicine | Admitting: Internal Medicine

## 2018-11-07 ENCOUNTER — Encounter: Payer: Self-pay | Admitting: Internal Medicine

## 2018-11-07 VITALS — BP 109/75 | HR 82 | Temp 98.4°F | Resp 16 | Ht 60.0 in | Wt 156.2 lb

## 2018-11-07 DIAGNOSIS — R1013 Epigastric pain: Secondary | ICD-10-CM | POA: Diagnosis not present

## 2018-11-07 DIAGNOSIS — K219 Gastro-esophageal reflux disease without esophagitis: Secondary | ICD-10-CM

## 2018-11-07 DIAGNOSIS — R1110 Vomiting, unspecified: Secondary | ICD-10-CM | POA: Diagnosis not present

## 2018-11-07 DIAGNOSIS — IMO0001 Reserved for inherently not codable concepts without codable children: Secondary | ICD-10-CM

## 2018-11-07 DIAGNOSIS — E1165 Type 2 diabetes mellitus with hyperglycemia: Secondary | ICD-10-CM

## 2018-11-07 DIAGNOSIS — E782 Mixed hyperlipidemia: Secondary | ICD-10-CM

## 2018-11-07 LAB — GLUCOSE, POCT (MANUAL RESULT ENTRY): POC Glucose: 252 mg/dl — AB (ref 70–99)

## 2018-11-07 MED ORDER — PANTOPRAZOLE SODIUM 20 MG PO TBEC: 20.0000 mg | DELAYED_RELEASE_TABLET | Freq: Every day | ORAL | 2 refills | Status: DC

## 2018-11-07 MED ORDER — METOCLOPRAMIDE HCL 5 MG PO TABS: 5.0000 mg | ORAL_TABLET | Freq: Two times a day (BID) | ORAL | 1 refills | Status: DC | PRN

## 2018-11-07 MED ORDER — METFORMIN HCL ER 500 MG PO TB24: 1000.0000 mg | ORAL_TABLET | Freq: Two times a day (BID) | ORAL | 2 refills | Status: DC

## 2018-11-07 MED ORDER — INSULIN DETEMIR 100 UNIT/ML FLEXPEN: 8.0000 [IU] | PEN_INJECTOR | Freq: Every day | SUBCUTANEOUS | 11 refills | Status: DC

## 2018-11-07 MED ORDER — PRAVASTATIN SODIUM 20 MG PO TABS: 20.0000 mg | ORAL_TABLET | Freq: Every day | ORAL | 6 refills | Status: DC

## 2018-11-07 MED ORDER — GLIPIZIDE 10 MG PO TABS: ORAL_TABLET | ORAL | 6 refills | Status: DC

## 2018-11-07 NOTE — Progress Notes (Signed)
Patient ID: Brenda Greer, female    DOB: 1986-01-04  MRN: [id number]  CC: Diabetes   Subjective: Brenda Greer is a 33 y.o. female who presents for chroinic ds management Her concerns today include:  Pt has DM, HL, GERD, Iron def anemia  DM:  Lantus added on last visit. Took it but out x 2 mths because it is no longer covered by her insurance.  Reports compliance with oral meds Checks BS BID - before BF and after lunch.  Gives range 250-290 before BF, 150-140 after lunch Eating Habits:  Feels she does okay with eating habits Exercise:  Not getting in much exercise.  States she works 7 days a wk at [employer]  C/o vomiting intermittently x 3 wks.  Sensation of wanting to vomit 1-2 hrs post meals.is associated with the pain in the pit of her stomach like a bloating.  No diarrhea.  She sometimes take Alka-Seltzer which helps.  Episodes sometimes worse with foods like eggs or chicken.  Requesting refill on PPI.  Last normal menstrual period started 4 days ago.  HL: Needing refill on Pravachol.  Patient Active Problem List   Diagnosis Date Noted  . Iron deficiency anemia due to chronic blood loss 07/02/2018  . Hyperlipidemia 02/26/2018  . Gastroesophageal reflux disease without esophagitis 02/26/2018  . Diabetes mellitus without complication ([place]) 14/43/1540  . Type II or unspecified type diabetes mellitus without mention of complication, uncontrolled 05/19/2013  . OBESITY, NOS 12/13/2006     Current Outpatient Medications on File Prior to Visit  Medication Sig Dispense Refill  . Blood Glucose Monitoring Suppl (TRUE METRIX METER) w/Device KIT Use as directed 1 kit 0  . Blood Glucose Monitoring Suppl (TRUE METRIX METER) w/Device KIT Use as directed 1 kit 0  . ferrous sulfate 325 (65 FE) MG EC tablet Take 1 tablet (325 mg total) by mouth daily with breakfast. 100 tablet 1  . gabapentin (NEURONTIN) 300 MG capsule TAKE 1 CAPSULE BY MOUTH AT BEDTIME 30 capsule 4  .  glucose blood (TRUE METRIX BLOOD GLUCOSE TEST) test strip Use as instructed 100 each 12  . Insulin Pen Needle (PEN NEEDLES) 31G X 6 MM MISC Use as directed 100 each 6  . Multiple Vitamin (MULTIVITAMIN WITH MINERALS) TABS Take 1 tablet by mouth daily.    . TRUEPLUS LANCETS 28G MISC Use as directed 100 each 6  . TRUEPLUS LANCETS 28G MISC Use as directed 100 each 12   No current facility-administered medications on file prior to visit.     No Known Allergies  Social History   Socioeconomic History  . Marital status: Single    Spouse name: Not on file  . Number of children: Not on file  . Years of education: Not on file  . Highest education level: Not on file  Occupational History  . Not on file  Social Needs  . Financial resource strain: Not on file  . Food insecurity:    Worry: Not on file    Inability: Not on file  . Transportation needs:    Medical: Not on file    Non-medical: Not on file  Tobacco Use  . Smoking status: Never Smoker  . Smokeless tobacco: Never Used  Substance and Sexual Activity  . Alcohol use: No  . Drug use: No  . Sexual activity: Not on file  Lifestyle  . Physical activity:    Days per week: Not on file    Minutes per session: Not on file  .  Stress: Not on file  Relationships  . Social connections:    Talks on phone: Not on file    Gets together: Not on file    Attends religious service: Not on file    Active member of club or organization: Not on file    Attends meetings of clubs or organizations: Not on file    Relationship status: Not on file  . Intimate partner violence:    Fear of current or ex partner: Not on file    Emotionally abused: Not on file    Physically abused: Not on file    Forced sexual activity: Not on file  Other Topics Concern  . Not on file  Social History Narrative  . Not on file    Family History  Problem Relation Age of Onset  . Diabetes Mother   . Hypertension Mother     No past surgical history on  file.  ROS: Review of Systems Negative except as above. PHYSICAL EXAM: BP 109/75   Pulse 82   Temp 98.4 F (36.9 C) (Oral)   Resp 16   Ht 5' (1.524 m)   Wt 156 lb 3.2 oz (70.9 kg)   SpO2 100%   BMI 30.51 kg/m   Wt Readings from Last 3 Encounters:  11/07/18 156 lb 3.2 oz (70.9 kg)  07/02/18 159 lb 9.6 oz (72.4 kg)  02/26/18 158 lb (71.7 kg)    Physical Exam  General appearance - alert, well appearing, and in no distress Mental status - normal mood, behavior, speech, dress, motor activity, and thought processes Nose - normal and patent, no erythema, discharge or polyps Mouth - mucous membranes moist, pharynx normal without lesions Neck - supple, no significant adenopathy Chest - clear to auscultation, no wheezes, rales or rhonchi, symmetric air entry Heart - normal rate, regular rhythm, normal S1, S2, no murmurs, rubs, clicks or gallops Abdomen - soft, nondistended, no masses or organomegaly.  Mild epigastric tenderness without guarding or rebound Extremities - peripheral pulses normal, no pedal edema, no clubbing or cyanosis   ASSESSMENT AND PLAN: 1. Diabetes mellitus type 2, uncontrolled, without complications (HCC) Discussed the importance of healthy eating habits, regular aerobic exercise (at least 150 minutes a week as tolerated) and medication compliance to achieve or maintain control of diabetes. -Change Lantus to Levemir which looks like it will be covered by her insurance - POCT glucose (manual entry) - Hemoglobin A1c - glipiZIDE (GLUCOTROL) 10 MG tablet; TAKE 1 TABLET BY MOUTH TWICE DAILY BEFORE MEAL(S)  Dispense: 60 tablet; Refill: 6 - metFORMIN (GLUCOPHAGE XR) 500 MG 24 hr tablet; Take 2 tablets (1,000 mg total) by mouth 2 (two) times daily with a meal.  Dispense: 120 tablet; Refill: 2 - CBC - Comprehensive metabolic panel - Lipid panel - Ambulatory referral to Ophthalmology - Amb ref to Medical Nutrition Therapy-MNT - Insulin Detemir (LEVEMIR) 100 UNIT/ML  Pen; Inject 8 Units into the skin daily.  Dispense: 15 mL; Refill: 11  2. Hyperlipidemia, unspecified hyperlipidemia type Refill Pravachol - pravastatin (PRAVACHOL) 20 MG tablet; Take 1 tablet (20 mg total) by mouth daily.  Dispense: 30 tablet; Refill: 6  3. Gastroesophageal reflux disease without esophagitis - pantoprazole (PROTONIX) 20 MG tablet; Take 1 tablet (20 mg total) by mouth daily.  Dispense: 30 tablet; Refill: 2 - Lipase  4. Recurrent vomiting Differential diagnosis includes gallbladder disease, pancreatitis, gastroparesis We will start with getting some baseline labs and right upper quadrant ultrasound.  If this is negative we will proceed  with a gastric emptying study.  Recommend taking low-dose Reglan as needed - metoCLOPramide (REGLAN) 5 MG tablet; Take 1 tablet (5 mg total) by mouth 2 (two) times daily as needed for nausea.  Dispense: 40 tablet; Refill: 1 - US Abdomen Limited RUQ; Future  5. Epigastric pain - US Abdomen Limited RUQ; Future    Patient was given the opportunity to ask questions.  Patient verbalized understanding of the plan and was able to repeat key elements of the plan.  [employer] interpreter used during this encounter.  Orders Placed This Encounter  Procedures  . US Abdomen Limited RUQ  . Hemoglobin A1c  . CBC  . Comprehensive metabolic panel  . Lipid panel  . Lipase  . Ambulatory referral to Ophthalmology  . Amb ref to Medical Nutrition Therapy-MNT  . POCT glucose (manual entry)     Requested Prescriptions   Signed Prescriptions Disp Refills  . glipiZIDE (GLUCOTROL) 10 MG tablet 60 tablet 6    Sig: TAKE 1 TABLET BY MOUTH TWICE DAILY BEFORE MEAL(S)  . pravastatin (PRAVACHOL) 20 MG tablet 30 tablet 6    Sig: Take 1 tablet (20 mg total) by mouth daily.  . pantoprazole (PROTONIX) 20 MG tablet 30 tablet 2    Sig: Take 1 tablet (20 mg total) by mouth daily.  . metFORMIN (GLUCOPHAGE XR) 500 MG 24 hr tablet 120 tablet 2    Sig: Take 2 tablets  (1,000 mg total) by mouth 2 (two) times daily with a meal.  . Insulin Detemir (LEVEMIR) 100 UNIT/ML Pen 15 mL 11    Sig: Inject 8 Units into the skin daily.  . metoCLOPramide (REGLAN) 5 MG tablet 40 tablet 1    Sig: Take 1 tablet (5 mg total) by mouth 2 (two) times daily as needed for nausea.    Return in about 4 weeks (around 12/05/2018) for Vomiting/abdominal pain.  Karle Plumber, MD, FACP

## 2018-11-08 LAB — COMPREHENSIVE METABOLIC PANEL
ALT: 29 IU/L (ref 0–32)
AST: 34 IU/L (ref 0–40)
Albumin/Globulin Ratio: 1.3 (ref 1.2–2.2)
Albumin: 4.1 g/dL (ref 3.8–4.8)
Alkaline Phosphatase: 72 IU/L (ref 39–117)
BUN / CREAT RATIO: 16 (ref 9–23)
BUN: 8 mg/dL (ref 6–20)
Bilirubin Total: 0.4 mg/dL (ref 0.0–1.2)
CALCIUM: 9.6 mg/dL (ref 8.7–10.2)
CO2: 20 mmol/L (ref 20–29)
Chloride: 102 mmol/L (ref 96–106)
Creatinine, Ser: 0.49 mg/dL — ABNORMAL LOW (ref 0.57–1.00)
GFR calc Af Amer: 148 mL/min/{1.73_m2} (ref >59)
GFR calc non Af Amer: 128 mL/min/{1.73_m2} (ref >59)
Globulin, Total: 3.2 g/dL (ref 1.5–4.5)
Glucose: 277 mg/dL — ABNORMAL HIGH (ref 65–99)
Potassium: 4.4 mmol/L (ref 3.5–5.2)
Sodium: 137 mmol/L (ref 134–144)
Total Protein: 7.3 g/dL (ref 6.0–8.5)

## 2018-11-08 LAB — LIPID PANEL
CHOL/HDL RATIO: 3.2 ratio (ref 0.0–4.4)
Cholesterol, Total: 202 mg/dL — ABNORMAL HIGH (ref 100–199)
HDL: 63 mg/dL (ref >39)
LDL Calculated: 122 mg/dL — ABNORMAL HIGH (ref 0–99)
Triglycerides: 86 mg/dL (ref 0–149)
VLDL Cholesterol Cal: 17 mg/dL (ref 5–40)

## 2018-11-08 LAB — HEMOGLOBIN A1C
Est. average glucose Bld gHb Est-mCnc: 278 mg/dL
Hgb A1c MFr Bld: 11.3 % — ABNORMAL HIGH (ref 4.8–5.6)

## 2018-11-08 LAB — LIPASE: Lipase: 19 U/L (ref 14–72)

## 2018-11-08 LAB — CBC
HEMATOCRIT: 37.1 % (ref 34.0–46.6)
Hemoglobin: 11.1 g/dL (ref 11.1–15.9)
MCH: 22 pg — ABNORMAL LOW (ref 26.6–33.0)
MCHC: 29.9 g/dL — ABNORMAL LOW (ref 31.5–35.7)
MCV: 74 fL — ABNORMAL LOW (ref 79–97)
Platelets: 280 10*3/uL (ref 150–450)
RBC: 5.05 x10E6/uL (ref 3.77–5.28)
RDW: 16.4 % — ABNORMAL HIGH (ref 11.7–15.4)
WBC: 5.6 10*3/uL (ref 3.4–10.8)

## 2018-11-12 ENCOUNTER — Ambulatory Visit (HOSPITAL_COMMUNITY)
Admission: RE | Admit: 2018-11-12 | Discharge: 2018-11-12 | Disposition: A | Discharge status: Home / Self Care | Payer: Commercial Managed Care - PPO | Source: Ambulatory Visit | Attending: Internal Medicine | Admitting: Internal Medicine

## 2018-11-12 DIAGNOSIS — R1110 Vomiting, unspecified: Secondary | ICD-10-CM | POA: Insufficient documentation

## 2018-11-12 DIAGNOSIS — R1013 Epigastric pain: Secondary | ICD-10-CM | POA: Diagnosis not present

## 2018-11-12 DIAGNOSIS — K8020 Calculus of gallbladder without cholecystitis without obstruction: Secondary | ICD-10-CM | POA: Diagnosis not present

## 2018-11-18 ENCOUNTER — Telehealth: Payer: Self-pay | Admitting: Internal Medicine

## 2018-11-18 ENCOUNTER — Telehealth: Payer: Self-pay

## 2018-11-18 ENCOUNTER — Other Ambulatory Visit: Payer: Self-pay

## 2018-11-18 DIAGNOSIS — K8050 Calculus of bile duct without cholangitis or cholecystitis without obstruction: Secondary | ICD-10-CM

## 2018-11-18 DIAGNOSIS — E1165 Type 2 diabetes mellitus with hyperglycemia: Principal | ICD-10-CM

## 2018-11-18 DIAGNOSIS — IMO0001 Reserved for inherently not codable concepts without codable children: Secondary | ICD-10-CM

## 2018-11-18 NOTE — Telephone Encounter (Signed)
[employer] [facility]  Id# [phone]  contacted pt to go over lab results pt didn't answer left a detailed vm informing pt of results and if she can give me a call back letting me know if she is okay with [place] placing the referral and if she has any questions or concerns to give me a call

## 2018-11-18 NOTE — Telephone Encounter (Signed)
[employer] [facility] [facility]  Id# [phone]  contacted pt to go over lab results pt didn't answer left a detailed vm informing pt of results and if she has any questions or concerns to give me a call

## 2018-11-18 NOTE — Telephone Encounter (Signed)
Pt called back to get her results. Please follow up

## 2018-11-19 ENCOUNTER — Other Ambulatory Visit: Payer: Self-pay | Admitting: Internal Medicine

## 2018-11-19 DIAGNOSIS — IMO0001 Reserved for inherently not codable concepts without codable children: Secondary | ICD-10-CM

## 2018-11-19 DIAGNOSIS — E1165 Type 2 diabetes mellitus with hyperglycemia: Principal | ICD-10-CM

## 2018-11-19 MED ORDER — BASAGLAR KWIKPEN 100 UNIT/ML ~~LOC~~ SOPN: 8.0000 [IU] | PEN_INJECTOR | Freq: Every day | SUBCUTANEOUS | 5 refills | Status: DC

## 2018-11-19 MED ORDER — PEN NEEDLES 31G X 6 MM MISC: 6 refills | Status: DC

## 2018-11-19 NOTE — Telephone Encounter (Signed)
Brenda Greer could you contact pt

## 2018-11-19 NOTE — Telephone Encounter (Signed)
[employer] [place]  Id# [phone] contacted pt to go over lab results pt is aware of results. Pt states she is unable to get the insulin that was prescribed because it cost to much.   Franky Macho would you be able to check with pt insurance and see what insulin her insurance will cover and if it will need to be the vials or pens.   If it has to be the vials pt doesn't know how to draw up so pt will need to see you for teaching

## 2018-11-19 NOTE — Telephone Encounter (Signed)
Ran Levemir, Lantus and Basaglar through [employer]. Basaglar is our cheapest option at $25.00 for 1 month. Also, with pt's insurance, her copays would be lowest with mail order pharmacy.

## 2018-11-19 NOTE — Addendum Note (Signed)
Addended by: Jonah Blue B on: 11/19/2018 01:40 PM   Modules accepted: Orders

## 2018-11-19 NOTE — Telephone Encounter (Signed)
Dr. Laural Benes pt states she would like the referral to be placed

## 2018-11-27 ENCOUNTER — Ambulatory Visit: Payer: Self-pay | Admitting: General Surgery

## 2018-11-27 DIAGNOSIS — K8020 Calculus of gallbladder without cholecystitis without obstruction: Secondary | ICD-10-CM | POA: Diagnosis not present

## 2018-12-02 ENCOUNTER — Other Ambulatory Visit: Payer: Self-pay | Admitting: Internal Medicine

## 2018-12-02 ENCOUNTER — Ambulatory Visit: Payer: Commercial Managed Care - PPO | Admitting: Registered"

## 2018-12-02 DIAGNOSIS — D509 Iron deficiency anemia, unspecified: Secondary | ICD-10-CM

## 2018-12-09 ENCOUNTER — Ambulatory Visit: Payer: Commercial Managed Care - PPO | Attending: Internal Medicine | Admitting: Internal Medicine

## 2018-12-09 ENCOUNTER — Encounter: Payer: Self-pay | Admitting: Internal Medicine

## 2018-12-09 VITALS — BP 104/66 | HR 82 | Temp 97.9°F | Resp 16 | Ht 60.0 in | Wt 156.0 lb

## 2018-12-09 DIAGNOSIS — E1165 Type 2 diabetes mellitus with hyperglycemia: Secondary | ICD-10-CM

## 2018-12-09 DIAGNOSIS — K8020 Calculus of gallbladder without cholecystitis without obstruction: Secondary | ICD-10-CM

## 2018-12-09 DIAGNOSIS — IMO0001 Reserved for inherently not codable concepts without codable children: Secondary | ICD-10-CM

## 2018-12-09 DIAGNOSIS — D509 Iron deficiency anemia, unspecified: Secondary | ICD-10-CM

## 2018-12-09 DIAGNOSIS — E782 Mixed hyperlipidemia: Secondary | ICD-10-CM

## 2018-12-09 LAB — GLUCOSE, POCT (MANUAL RESULT ENTRY): POC Glucose: 243 mg/dl — AB (ref 70–99)

## 2018-12-09 MED ORDER — INSULIN NPH (HUMAN) (ISOPHANE) 100 UNIT/ML ~~LOC~~ SUSP: 8.0000 [IU] | Freq: Every day | SUBCUTANEOUS | 11 refills | Status: DC

## 2018-12-09 MED ORDER — PRAVASTATIN SODIUM 20 MG PO TABS: 30.0000 mg | ORAL_TABLET | Freq: Every day | ORAL | 6 refills | Status: DC

## 2018-12-09 MED ORDER — "INSULIN SYRINGE 31G X 5/16"" 1 ML MISC": 6 refills | Status: DC

## 2018-12-09 MED ORDER — FERROUS SULFATE 325 (65 FE) MG PO TBEC: DELAYED_RELEASE_TABLET | ORAL | 0 refills | Status: DC

## 2018-12-09 NOTE — Progress Notes (Signed)
Patient ID: Brenda Greer, female    DOB: [birth year]  MRN: [id number]  CC: Diabetes   Subjective: Brenda Greer is a 33 y.o. female who presents for 1 mth f/u DM Her concerns today include:  Pt has DM, HL, GERD, Iron def anemia  DM: Patient placed on Levemir on last visit.  However she called back stating that her copay was $100 her insurance so we changed her to [employer]. Checks BS: range a.m 160-240, after lunch 170-190 Did not receive appt as yet for eye.  Has appt with nutritionist tomorrow  Anemia: This had improved on repeat CBC.  HL: Not at goal on recent lipid profile.  Reports she was taking Pravachol consistently on last visit.  Recurrent vomiting:  GB US positive for stone.  Saw surgeon who recommended lap cholecystectomy but pt not sure what she will do due to copay.  Still has recurrent vomiting  Patient Active Problem List   Diagnosis Date Noted  . Iron deficiency anemia due to chronic blood loss 07/02/2018  . Hyperlipidemia 02/26/2018  . Gastroesophageal reflux disease without esophagitis 02/26/2018  . Diabetes mellitus without complication ([place]) 22/44/9753  . Type II or unspecified type diabetes mellitus without mention of complication, uncontrolled 05/19/2013  . OBESITY, NOS 12/13/2006     Current Outpatient Medications on File Prior to Visit  Medication Sig Dispense Refill  . Blood Glucose Monitoring Suppl (TRUE METRIX METER) w/Device KIT Use as directed 1 kit 0  . Blood Glucose Monitoring Suppl (TRUE METRIX METER) w/Device KIT Use as directed 1 kit 0  . ferrous sulfate 325 (65 FE) MG EC tablet TAKE 1 TABLET BY MOUTH ONCE DAILY WITH BREAKFAST 100 tablet 0  . gabapentin (NEURONTIN) 300 MG capsule TAKE 1 CAPSULE BY MOUTH AT BEDTIME 30 capsule 4  . glipiZIDE (GLUCOTROL) 10 MG tablet TAKE 1 TABLET BY MOUTH TWICE DAILY BEFORE MEAL(S) 60 tablet 6  . glucose blood (TRUE METRIX BLOOD GLUCOSE TEST) test strip Use as instructed 100 each 12  .  Insulin Glargine (BASAGLAR KWIKPEN) 100 UNIT/ML SOPN Inject 0.08 mLs (8 Units total) into the skin daily. 15 mL 5  . Insulin Pen Needle (PEN NEEDLES) 31G X 6 MM MISC Use as directed 100 each 6  . metFORMIN (GLUCOPHAGE XR) 500 MG 24 hr tablet Take 2 tablets (1,000 mg total) by mouth 2 (two) times daily with a meal. 120 tablet 2  . metoCLOPramide (REGLAN) 5 MG tablet Take 1 tablet (5 mg total) by mouth 2 (two) times daily as needed for nausea. 40 tablet 1  . Multiple Vitamin (MULTIVITAMIN WITH MINERALS) TABS Take 1 tablet by mouth daily.    . pantoprazole (PROTONIX) 20 MG tablet Take 1 tablet (20 mg total) by mouth daily. 30 tablet 2  . pravastatin (PRAVACHOL) 20 MG tablet Take 1 tablet (20 mg total) by mouth daily. 30 tablet 6  . TRUEPLUS LANCETS 28G MISC Use as directed 100 each 6  . TRUEPLUS LANCETS 28G MISC Use as directed 100 each 12   No current facility-administered medications on file prior to visit.     No Known Allergies  Social History   Socioeconomic History  . Marital status: Single    Spouse name: Not on file  . Number of children: Not on file  . Years of education: Not on file  . Highest education level: Not on file  Occupational History  . Not on file  Social Needs  . Financial resource strain: Not on file  .  Food insecurity:    Worry: Not on file    Inability: Not on file  . Transportation needs:    Medical: Not on file    Non-medical: Not on file  Tobacco Use  . Smoking status: Never Smoker  . Smokeless tobacco: Never Used  Substance and Sexual Activity  . Alcohol use: No  . Drug use: No  . Sexual activity: Not on file  Lifestyle  . Physical activity:    Days per week: Not on file    Minutes per session: Not on file  . Stress: Not on file  Relationships  . Social connections:    Talks on phone: Not on file    Gets together: Not on file    Attends religious service: Not on file    Active member of club or organization: Not on file    Attends meetings  of clubs or organizations: Not on file    Relationship status: Not on file  . Intimate partner violence:    Fear of current or ex partner: Not on file    Emotionally abused: Not on file    Physically abused: Not on file    Forced sexual activity: Not on file  Other Topics Concern  . Not on file  Social History Narrative  . Not on file    Family History  Problem Relation Age of Onset  . Diabetes Mother   . Hypertension Mother     No past surgical history on file.  ROS: Review of Systems Negative except as stated above  PHYSICAL EXAM: BP 104/66   Pulse 82   Temp 97.9 F (36.6 C) (Oral)   Resp 16   Ht 5' (1.524 m)   Wt 156 lb (70.8 kg)   SpO2 100%   BMI 30.47 kg/m   Physical Exam  General appearance - alert, well appearing, and in no distress Mental status - normal mood, behavior, speech, dress, motor activity, and thought processes Mouth - mucous membranes moist, pharynx normal without lesions Chest - clear to auscultation, no wheezes, rales or rhonchi, symmetric air entry Heart - normal rate, regular rhythm, normal S1, S2, no murmurs, rubs, clicks or gallops Extremities - peripheral pulses normal, no pedal edema, no clubbing or cyanosis  Results for orders placed or performed in visit on 12/09/18  POCT glucose (manual entry)  Result Value Ref Range   POC Glucose 243 (A) 70 - 99 mg/dl    CMP Latest Ref Rng & Units 11/07/2018 09/10/2017 04/09/2017  Glucose 65 - 99 mg/dL 277(H) 171(H) 192(H)  BUN 6 - 20 mg/dL '8 7 8  '$ Creatinine 0.57 - 1.00 mg/dL 0.49(L) 0.50(L) 0.49(L)  Sodium 134 - 144 mmol/L 137 140 137  Potassium 3.5 - 5.2 mmol/L 4.4 4.1 4.0  Chloride 96 - 106 mmol/L 102 104 103  CO2 20 - 29 mmol/L '20 20 20  '$ Calcium 8.7 - 10.2 mg/dL 9.6 9.1 9.3  Total Protein 6.0 - 8.5 g/dL 7.3 7.2 6.8  Total Bilirubin 0.0 - 1.2 mg/dL 0.4 <0.2 <0.2  Alkaline Phos 39 - 117 IU/L 72 64 58  AST 0 - 40 IU/L 34 21 26  ALT 0 - 32 IU/L '29 16 31   '$ Lipid Panel     Component Value  Date/Time   CHOL 202 (H) 11/07/2018 1222   TRIG 86 11/07/2018 1222   HDL 63 11/07/2018 1222   CHOLHDL 3.2 11/07/2018 1222   CHOLHDL 4.2 05/19/2013 1703   VLDL 38 05/19/2013 1703  LDLCALC 122 (H) 11/07/2018 1222    CBC    Component Value Date/Time   WBC 5.6 11/07/2018 1222   WBC 9.4 04/06/2013 2035   RBC 5.05 11/07/2018 1222   RBC 4.10 04/06/2013 2035   HGB 11.1 11/07/2018 1222   HCT 37.1 11/07/2018 1222   PLT 280 11/07/2018 1222   MCV 74 (L) 11/07/2018 1222   MCH 22.0 (L) 11/07/2018 1222   MCH 24.9 (L) 04/06/2013 2035   MCHC 29.9 (L) 11/07/2018 1222   MCHC 32.3 04/06/2013 2035   RDW 16.4 (H) 11/07/2018 1222   LYMPHSABS 2.9 04/06/2013 2035   MONOABS 0.4 04/06/2013 2035   EOSABS 0.1 04/06/2013 2035   BASOSABS 0.0 04/06/2013 2035    ASSESSMENT AND PLAN:  1. Diabetes mellitus type 2, uncontrolled, without complications ([place]) Since she is unable to afford co-pay on Basaglar or Levemir, will put her on Humulin N.  Patient prescribed the vials.  Advised to inject 8 units at bedtime.  Patient reports being on insulin injections in the past during her last pregnancy and knows how to drop and give herself the insulin.  Continue to encourage healthy eating habits.  She will keep the appointment with the nutritionist. - POCT glucose (manual entry) - insulin NPH Human (HUMULIN N,NOVOLIN N) 100 UNIT/ML injection; Inject 0.08 mLs (8 Units total) into the skin at bedtime.  Dispense: 10 mL; Refill: 11 - Insulin Syringe-Needle U-100 (INSULIN SYRINGE 1CC/31GX5/16") 31G X 5/16" 1 ML MISC; Use as directed  Dispense: 100 each; Refill: 6  2. Microcytic anemia - ferrous sulfate 325 (65 FE) MG EC tablet; TAKE 1 TABLET BY MOUTH ONCE DAILY WITH BREAKFAST  Dispense: 100 tablet; Refill: 0  3. Gall bladder stones Patient will decide whether she will go through with having the cholecystectomy but she is concerned about her out-of-pocket cost  4. Mixed hyperlipidemia Not at goal.  Increase  pravastatin to 30 mg daily - pravastatin (PRAVACHOL) 20 MG tablet; Take 1.5 tablets (30 mg total) by mouth daily.  Dispense: 45 tablet; Refill: 6    Patient was given the opportunity to ask questions.  Patient verbalized understanding of the plan and was able to repeat key elements of the plan.  [employer] interpreter used during this encounter. #[id number]  Orders Placed This Encounter  Procedures  . POCT glucose (manual entry)     Requested Prescriptions    No prescriptions requested or ordered in this encounter    No follow-ups on file.  Karle Plumber, MD, FACP

## 2018-12-09 NOTE — Patient Instructions (Addendum)
Increase Pravachol to 1-1/2 tablets daily.  Start NPH insulin 8 units at bedtime.  Indicaciones para aplicar una inyeccin de dosis nica de insulina, en adultos Insulin Injection Instructions, Single Insulin Dose, Adult Una inyeccin subcutnea es una inyeccin de medicamentos que se aplica en la capa de grasa y tejido que se encuentra entre la piel y [place]. Las personas con diabetes tipo1 deben inyectarse insulina porque su cuerpo no la producen. Las personas con diabetes tipo 2pueden necesitar inyecciones de insulina.  Hay diferentes tipos de insulina. El tipo de [place] que use determinar cuntas inyecciones deber aplicarse y cundo. Materiales necesarios:  [place] y jabn para lavarse las manos.  Samule Dry para insulina nueva y sin usar.  El frasco con insulina (ampolla).  Paos con alcohol.  Un recipiente para desechar elementos cortopunzantes (recipiente para objetos punzantes), por ejemplo, una botella de plstico vaca con tapa. Cmo elegir la zona para aplicar la inyeccin El cuerpo absorbe la insulina de manera diferente dependiendo de dnde se inyecte (sitio de la inyeccin). Lo mejor es inyectar la insulina siempre en la misma zona del cuerpo (por ejemplo, siempre en el abdomen), pero usando un lugar diferente de esa zona para cada inyeccin. No debe inyectarse la insulina siempre en el mismo lugar. Hay cinco zonas principales que pueden utilizarse para las inyecciones. Estas zonas son las siguientes:  El abdomen. Esta es la zona ms recomendada.  La cara anterior del muslo.  La cara superior y externa del muslo.  La cara superior y externa del brazo.  La cara superior y externa de la nalga. Cmo aplicar una inyeccin de dosis nica de [employer], siga los pasos que se describen en Preparacin y Engineer, mining contine con los pasos de Empuje aire dentro de la [place]. [place], siga los pasos en [place] la jeringa y finalice con los pasos para Inyecte la  [place]. Preparacin 1. Lvese las manos con agua y [place]. Use desinfectante para manos si no dispone de [place] y [place]. 2. Antes de aplicarse la inyeccin de insulina, no se olvide de controlar su nivel de azcar en la sangre (glucemia) y anotar Administrator, sports. Siga las indicaciones de su mdico acerca de cmo proceder si su nivel de glucemia es mayor o menor que su rango normal. 3. Use una jeringa para insulina nueva y sin usar cada vez que deba aplicarse la inyeccin de [place]. 4. Asegrese de tener el tipo correcto de [place] para la concentracin de insulina que [place]. 5. Verifique la fecha de vencimiento y el tipo de insulina que [place]. 6. Si va a utilizar [employer], revise que sea clara y no tenga grumos. 7. No agite la ampolla para prepararla. Haga rodar [employer] ampolla entre sus palmas varias veces. 8. Quite la cubierta plstica de la ampolla de insulina. Las [employer] este tipo de [place] cuando son nuevas. 9. Limpie el tapn de goma de la ampolla con un pao con alcohol. 10. Retire la cubierta plstica de la aguja de la [place]. No deje que la aguja toque nada. Empuje aire dentro de la ampolla 1. Para llenar la jeringa con aire, tire lentamente del mbolo de la [place]. Deje de tirar del mbolo cuando el indicador de dosis llegue al nmero de unidades que va a usar. 2. Con el lado derecho de la ampolla mirando hacia arriba, introduzca la aguja a travs del tapn de goma de la ampolla. No coloque la ampolla boca abajo para hacer esto. 3. Empuje el mbolo hasta el fondo  de la [place]. Al hacerlo, empujar aire hacia dentro de la [place]. 4. No retire la aguja de la ampolla todava. Llene la jeringa  1. Con la aguja an dentro de [employer], gire la ampolla hacia abajo y sostngala a nivel de los ojos. 2. Tire lentamente del mbolo. Deje de tirar del mbolo cuando el indicador de dosis llegue al nmero de unidades que desea usar. 3. Si nota burbujas de aire en la  [place] lentamente el mbolo hacia arriba y [place] 2o 3veces para eliminarlas. ? Si tiene Ambulance person para eliminar las burbujas de [place], tire del mbolo hasta que el indicador de dosis vuelva a la dosis Geologist, engineering. 4. Quite la aguja de la ampolla. No deje que la aguja toque nada. Inyecte la insulina  1. Limpie el lugar donde aplicar la inyeccin con un pao con alcohol. Deje que el lugar se seque al aire. 2. Sostenga la jeringa como un lpiz, con la mano con la que escribe. 3. Use la otra mano para pellizcar y sujetar aproximadamente una pulgada (2,5cm) de piel. No toque directamente la zona limpia de la piel. 4. Inserte la aguja en la piel con cuidado pero rpidamente. La aguja debe estar en un ngulo de 90 grados (perpendicular) a la piel. 5. Empuje la aguja lo ms que se pueda (hasta el cono de la [place]). 6. Cuando la aguja est totalmente insertada en la piel, empuje el mbolo hasta el fondo de la jeringa con el pulgar o el dedo ndice de la mano con la que escribe para Tourist information centre manager la insulina. 7. Suelte la zona de la piel que est pellizcando. Contine sosteniendo la [place] en su lugar con la mano con la que escribe. 8. Espere 10segundos, y luego extraiga la aguja de la piel. [employer], permitir que pase toda la insulina de la jeringa y la aguja a su cuerpo. 9. Sostenga el pao con alcohol presionando sobre el sitio de la inyeccin Hotel manager cualquier sangrado. No frote la zona. 10. No vuelva a colocar la cubierta plstica en la aguja. 11. Deseche la [employer]ung Rudd y la aguja directamente en un recipiente para objetos punzantes, como una botella de plstico vaca con tapa. Cmo desechar los materiales usados  Deseche todas las agujas usadas en un recipiente para objetos cortopunzantes (resistente a punciones). Pregunte en su farmacia local dnde puede conseguir este tipo de recipiente para desechos, o use una botella de plstico vaca con tapa, como las botellas de  detergente lquido para la ropa.  Siga las normas de seguridad de la zona en la que usted vive para [employer] materiales usados. No use la jeringa ni la aguja ms de [employer].  Deseche las ampollas vacas en el cesto de basura de su casa. Preguntas para hacerle al mdico  Con qu frecuencia debo inyectarme insulina?  Con qu frecuencia debo controlarme la glucemia?  Qu cantidad de insulina debo inyectarme cada vez?  Cules son los efectos secundarios?  Qu debo hacer si mi nivel de glucemia es muy alto?  Qu debo hacer si mi nivel de glucemia es muy bajo?  Qu debo hacer si olvido inyectarme la insulina?  A qu nmero debo llamar si tengo preguntas? Dnde encontrar ms informacin  Asociacin Estadounidense de la Diabetes (American Diabetes Association, ADA): www.diabetes.org  Asociacin Estadounidense de Instructores para el Cuidado de la Diabetes (American Association of Diabetes Educators, AADE): https://www.diabeteseducator.org Resumen  Una inyeccin subcutnea es una inyeccin de medicamentos que se aplica en  la capa de grasa y tejido que se encuentra entre la piel y [place].  Antes de aplicarse la inyeccin de insulina, no se olvide de controlar su nivel de azcar en la sangre (glucemia) y anotar Administrator, sports.  El tipo de [place] que use determinar cuntas inyecciones deber aplicarse y cundo.  Verifique la fecha de vencimiento y el tipo de insulina que [place].  Lo mejor es inyectar la insulina siempre en la misma zona del cuerpo (por ejemplo, siempre en el abdomen), pero usando un lugar diferente de esa zona para cada inyeccin. Esta informacin no tiene Theme park manager el consejo del mdico. Asegrese de hacerle al mdico cualquier pregunta que tenga. Document Released: 01/08/2017 Document Revised: 07/03/2017 Document Reviewed: 11/05/2015 Elsevier Interactive Patient Education  2019 [employer].

## 2018-12-10 ENCOUNTER — Encounter: Payer: Commercial Managed Care - PPO | Attending: Internal Medicine | Admitting: *Deleted

## 2018-12-10 DIAGNOSIS — E119 Type 2 diabetes mellitus without complications: Secondary | ICD-10-CM | POA: Insufficient documentation

## 2018-12-13 NOTE — Progress Notes (Signed)
Diabetes Self-Management Education  Visit Type: First/Initial  Appt. Start Time: 1400 Appt. End Time: 1530  12/13/2018  [name], identified by name and date of birth, is a 33 y.o. female with a diagnosis of Diabetes: Type 2. She speaks Spanish, we had live interpretor for this visit. 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 She states she tests her BG 1-2 times a day and notices her fasting numbers are higher than later in the day. She would like to learn about how food affects her diabetes and what foods she should eat.  ASSESSMENT  There were no vitals taken for this visit. There is no height or weight on file to calculate BMI.  Diabetes Self-Management Education - 12/13/18 0911      Visit Information   Visit Type  First/Initial      Initial Visit   Diabetes Type  Type 2    Are you currently following a meal plan?  No    Are you taking your medications as prescribed?  Yes    Date Diagnosed  14 years ago, she had GDM with last daughter and diabetes never went away      Health Coping   How would you rate your overall health?  Very Poor      Psychosocial Assessment   Patient Belief/Attitude about Diabetes  --   did not answer   Self-care barriers  Low literacy;English as a second language    Other persons present  Patient;Interpreter    Patient Concerns  Nutrition/Meal planning;Glycemic Control;Medication    Special Needs  Simplified materials;Other (comment)   Spanish   Preferred Learning Style  Visual;Hands on    Learning Readiness  Contemplating    How often do you need to have someone help you when you read instructions, pamphlets, or other written materials from your doctor or pharmacy?  4 - Often    What is the last grade level you completed in school?  2nd grade in [place]      Pre-Education Assessment   Patient understands the diabetes disease and treatment process.  Needs  Instruction    Patient understands incorporating nutritional management into lifestyle.  Needs Instruction    Patient undertands incorporating physical activity into lifestyle.  Needs Instruction    Patient understands using medications safely.  Needs Instruction    Patient understands monitoring blood glucose, interpreting and using results  Needs Instruction    Patient understands prevention, detection, and treatment of acute complications.  Needs Instruction    Patient understands prevention, detection, and treatment of chronic complications.  Needs Instruction    Patient understands how to develop strategies to address psychosocial issues.  Needs Instruction      Complications   Last HgB A1C per patient/outside source  11 %    How often do you check your blood sugar?  1-2 times/day    Fasting Blood glucose range (mg/dL)  >005    Postprandial Blood glucose range (mg/dL)  >110    Number of hypoglycemic episodes per month  0    Have you had a dilated eye exam in the past 12 months?  No    Have you had a dental exam in the past 12 months?  No    Are you checking your feet?  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    Physical activity and exercise   Role of exercise on diabetes management, blood pressure control and cardiac health.    Medications  Reviewed patients medication for diabetes, action, purpose, timing of dose and side effects.    Monitoring  Identified appropriate SMBG and/or A1C goals.    Psychosocial adjustment  Worked with patient to identify barriers to care and solutions;Role of stress on diabetes      Individualized Goals (developed by patient)   Nutrition  General guidelines for healthy choices and portions discussed    Physical Activity  Exercise 3-5 times per week    Medications  take my medication as prescribed    Monitoring   test blood glucose pre and post meals as discussed      Post-Education Assessment   Patient understands incorporating nutritional management into lifestyle.  Demonstrates understanding / competency    Patient undertands incorporating physical activity into lifestyle.  Demonstrates understanding / competency    Patient understands using medications safely.  Demonstrates understanding / competency    Patient understands monitoring blood glucose, interpreting and using results  Demonstrates understanding / competency      Outcomes   Expected Outcomes  Demonstrated interest in learning. Expect positive outcomes    Future DMSE  PRN    Program Status  Completed       Individualized Plan for Diabetes Self-Management Training:   Learning Objective:  Patient will have a greater understanding of diabetes self-management. Patient education plan is to attend individual and/or group sessions per assessed needs and concerns.   Plan:   Patient Instructions  Plan:  Aim for 3 Carb Choices per meal (45 grams) +/- 1 either way  Aim for 0-2 Carbs  per snack if hungry  Include protein in moderation with your meals and snacks Continue with your activity level daily and increase as tolerated Consider checking BG at alternate times per day   Continue taking medication as directed by MD  Expected Outcomes:  Demonstrated interest in learning. Expect positive outcomes  Education material provided: Spanish: ADA Diabetes: Your Take Control Guide, SpanishMeal plan card and Insulin Action handout  If problems or questions, patient to contact team via:  Phone  Future DSME appointment: PRN

## 2018-12-13 NOTE — Patient Instructions (Signed)
Plan:  Aim for 3 Carb Choices per meal (45 grams) +/- 1 either way  Aim for 0-2 Carbs per snack if hungry  Include protein in moderation with your meals and snacks Continue with your activity level daily and increase as tolerated Consider checking BG at alternate times per day   Continue taking medication as directed by MD

## 2019-03-11 ENCOUNTER — Ambulatory Visit: Payer: Commercial Managed Care - PPO | Attending: Internal Medicine | Admitting: Internal Medicine

## 2019-03-11 ENCOUNTER — Encounter: Payer: Self-pay | Admitting: Internal Medicine

## 2019-03-11 DIAGNOSIS — IMO0001 Reserved for inherently not codable concepts without codable children: Secondary | ICD-10-CM

## 2019-03-11 DIAGNOSIS — D500 Iron deficiency anemia secondary to blood loss (chronic): Secondary | ICD-10-CM | POA: Diagnosis not present

## 2019-03-11 DIAGNOSIS — IMO0002 Reserved for concepts with insufficient information to code with codable children: Secondary | ICD-10-CM

## 2019-03-11 DIAGNOSIS — E1169 Type 2 diabetes mellitus with other specified complication: Secondary | ICD-10-CM | POA: Diagnosis not present

## 2019-03-11 DIAGNOSIS — E1142 Type 2 diabetes mellitus with diabetic polyneuropathy: Secondary | ICD-10-CM | POA: Diagnosis not present

## 2019-03-11 DIAGNOSIS — E785 Hyperlipidemia, unspecified: Secondary | ICD-10-CM

## 2019-03-11 DIAGNOSIS — E1165 Type 2 diabetes mellitus with hyperglycemia: Secondary | ICD-10-CM | POA: Diagnosis not present

## 2019-03-11 MED ORDER — INSULIN NPH (HUMAN) (ISOPHANE) 100 UNIT/ML ~~LOC~~ SUSP: 11.0000 [IU] | Freq: Every day | SUBCUTANEOUS | 11 refills | Status: DC

## 2019-03-11 NOTE — Progress Notes (Signed)
Pt states her blood sugar this morning was 100

## 2019-03-11 NOTE — Progress Notes (Signed)
Virtual Visit via Telephone Note Due to current restrictions/limitations of in-office visits due to the COVID-19 pandemic, this scheduled clinical appointment was converted to a telehealth visit  I connected with Brenda Greer on 03/11/19 at 10:36 a.m by telephone and verified that I am speaking with the correct person using two identifiers. I am in my office.  The patient is at home.  Only the patient, myself and [phone]([id number]) Juan from [employer] participated in this encounter.  I discussed the limitations, risks, security and privacy concerns of performing an evaluation and management service by telephone and the availability of in person appointments. I also discussed with the patient that there may be a patient responsible charge related to this service. The patient expressed understanding and agreed to proceed.  History of Present Illness: Pt has DM, HL, GERD, Iron def anemia.  Last seen 11/2018.  DM:  Checking BS once a day because she is currently not working and can not afford to check more often.  BS before BF range 150-200.  No low BS episode Meds:  Reports compliance with NPH insulin 8 units at bedtime, Metformin and Glucotrol Eating habits:  Saw nutritionist and food it helpful.  Feels she is doing better with eating habits Exercise:  Just started walking 3 x a wk for exercise Has not received appt as yet for eye exam  HL:  Pravachol dose increased on last visit to 30 mg. Reports compliance with med and tolerating med.  IDA:  Taking the iron supplement.  No dizziness  Gall stones: She has not worked in 3 mths so she decided to hold off on having elective surgery  Observations/Objective: Lab Results  Component Value Date   WBC 5.6 11/07/2018   HGB 11.1 11/07/2018   HCT 37.1 11/07/2018   MCV 74 (L) 11/07/2018   PLT 280 11/07/2018     Chemistry      Component Value Date/Time   NA 137 11/07/2018 1222   K 4.4 11/07/2018 1222   CL 102 11/07/2018 1222   CO2  20 11/07/2018 1222   BUN 8 11/07/2018 1222   CREATININE 0.49 (L) 11/07/2018 1222   CREATININE 0.60 05/19/2013 1703      Component Value Date/Time   CALCIUM 9.6 11/07/2018 1222   ALKPHOS 72 11/07/2018 1222   AST 34 11/07/2018 1222   ALT 29 11/07/2018 1222   BILITOT 0.4 11/07/2018 1222     Lab Results  Component Value Date   CHOL 202 (H) 11/07/2018   HDL 63 11/07/2018   LDLCALC 122 (H) 11/07/2018   TRIG 86 11/07/2018   CHOLHDL 3.2 11/07/2018    Assessment and Plan: 1. Uncontrolled type 2 diabetes mellitus with peripheral neuropathy (HCC) Reported blood sugars not at goal. Recommend increasing NPH insulin to 11 units at bedtime.  If after 3 days, morning blood sugars are still running higher than 130, patient advised to increase the insulin by an additional 2 units.  We will plan to recheck A1c on her next visit in 3 months -Commended her on making changes in her eating habits and trying to exercise more.  Encouraged her to keep up the good works - Ambulatory referral to Ophthalmology - insulin NPH Human (NOVOLIN N) 100 UNIT/ML injection; Inject 0.11 mLs (11 Units total) into the skin at bedtime.  Dispense: 10 mL; Refill: 11  2. Hyperlipidemia associated with type 2 diabetes mellitus (HCC) Continue Pravachol  3. Iron deficiency anemia due to chronic blood loss Continue iron supplement  Follow Up Instructions: F/u in 3 mths   I discussed the assessment and treatment plan with the patient. The patient was provided an opportunity to ask questions and all were answered. The patient agreed with the plan and demonstrated an understanding of the instructions.   The patient was advised to call back or seek an in-person evaluation if the symptoms worsen or if the condition fails to improve as anticipated.  I provided 11 minutes of non-face-to-face time during this encounter.   Jonah Blue, MD

## 2019-06-08 ENCOUNTER — Other Ambulatory Visit: Payer: Self-pay | Admitting: Internal Medicine

## 2019-06-08 DIAGNOSIS — IMO0001 Reserved for inherently not codable concepts without codable children: Secondary | ICD-10-CM

## 2019-06-12 ENCOUNTER — Ambulatory Visit: Payer: Commercial Managed Care - PPO

## 2019-06-17 ENCOUNTER — Ambulatory Visit: Payer: Commercial Managed Care - PPO | Admitting: Internal Medicine

## 2019-07-14 ENCOUNTER — Other Ambulatory Visit: Payer: Self-pay | Admitting: Internal Medicine

## 2019-07-14 DIAGNOSIS — E782 Mixed hyperlipidemia: Secondary | ICD-10-CM

## 2019-07-18 ENCOUNTER — Other Ambulatory Visit: Payer: Self-pay

## 2019-07-18 ENCOUNTER — Encounter: Payer: Self-pay | Admitting: Internal Medicine

## 2019-07-18 ENCOUNTER — Ambulatory Visit: Payer: Commercial Managed Care - PPO | Attending: Internal Medicine | Admitting: Internal Medicine

## 2019-07-18 VITALS — BP 109/73 | HR 98 | Temp 97.8°F | Resp 18 | Ht 60.0 in | Wt 159.0 lb

## 2019-07-18 DIAGNOSIS — IMO0002 Reserved for concepts with insufficient information to code with codable children: Secondary | ICD-10-CM | POA: Insufficient documentation

## 2019-07-18 DIAGNOSIS — E1142 Type 2 diabetes mellitus with diabetic polyneuropathy: Secondary | ICD-10-CM

## 2019-07-18 DIAGNOSIS — E785 Hyperlipidemia, unspecified: Secondary | ICD-10-CM

## 2019-07-18 DIAGNOSIS — Z2821 Immunization not carried out because of patient refusal: Secondary | ICD-10-CM

## 2019-07-18 DIAGNOSIS — E1169 Type 2 diabetes mellitus with other specified complication: Secondary | ICD-10-CM

## 2019-07-18 DIAGNOSIS — E1165 Type 2 diabetes mellitus with hyperglycemia: Secondary | ICD-10-CM

## 2019-07-18 DIAGNOSIS — G43009 Migraine without aura, not intractable, without status migrainosus: Secondary | ICD-10-CM

## 2019-07-18 LAB — POCT GLYCOSYLATED HEMOGLOBIN (HGB A1C): Hemoglobin A1C: 11.8 % — AB (ref 4.0–5.6)

## 2019-07-18 LAB — POCT URINALYSIS DIP (CLINITEK)
Bilirubin, UA: NEGATIVE
Blood, UA: NEGATIVE
Glucose, UA: 500 mg/dL — AB
Leukocytes, UA: NEGATIVE
Nitrite, UA: NEGATIVE
POC PROTEIN,UA: NEGATIVE
Spec Grav, UA: 1.02 (ref 1.010–1.025)
Urobilinogen, UA: 0.2 E.U./dL
pH, UA: 5.5 (ref 5.0–8.0)

## 2019-07-18 LAB — GLUCOSE, POCT (MANUAL RESULT ENTRY): POC Glucose: 281 mg/dl — AB (ref 70–99)

## 2019-07-18 MED ORDER — INSULIN NPH (HUMAN) (ISOPHANE) 100 UNIT/ML ~~LOC~~ SUSP: 17.0000 [IU] | Freq: Every day | SUBCUTANEOUS | 11 refills | Status: DC

## 2019-07-18 MED ORDER — TOPIRAMATE 25 MG PO TABS: 25.0000 mg | ORAL_TABLET | Freq: Every day | ORAL | 1 refills | Status: DC

## 2019-07-18 MED ORDER — GLIPIZIDE 10 MG PO TABS: 10.0000 mg | ORAL_TABLET | Freq: Two times a day (BID) | ORAL | 6 refills | Status: DC

## 2019-07-18 MED ORDER — METFORMIN HCL ER 500 MG PO TB24: 1000.0000 mg | ORAL_TABLET | Freq: Two times a day (BID) | ORAL | 6 refills | Status: DC

## 2019-07-18 MED ORDER — GABAPENTIN 300 MG PO CAPS: 300.0000 mg | ORAL_CAPSULE | Freq: Every day | ORAL | 4 refills | Status: DC

## 2019-07-18 MED ORDER — SUMATRIPTAN SUCCINATE 50 MG PO TABS: ORAL_TABLET | ORAL | 2 refills | Status: DC

## 2019-07-18 MED ORDER — TOPIRAMATE 25 MG PO TABS: 25.0000 mg | ORAL_TABLET | Freq: Two times a day (BID) | ORAL | 1 refills | Status: DC

## 2019-07-18 MED ORDER — PRAVASTATIN SODIUM 20 MG PO TABS: ORAL_TABLET | ORAL | 0 refills | Status: DC

## 2019-07-18 NOTE — Patient Instructions (Signed)
Cefalea migraosa Migraine Headache Una cefalea migraosa es un dolor muy intenso y punzante en uno o ambos lados de la cabeza. Este tipo de dolor de cabeza tambin puede causar otros sntomas. Puede durar desde 4horas hasta 3das. Hable con su mdico sobre las cosas que pueden causar (desencadenar) esta afeccin. Cules son las causas? Se desconoce la causa exacta de esta afeccin. Esta afeccin puede desencadenarse o ser causada por lo siguiente:  Consumo de alcohol.  Consumo de cigarrillos.  Tomar medicamentos como por ejemplo: ? Medicamentos para aliviar el dolor torcico (nitroglicerina). ? Anticonceptivos orales. ? Estrgeno. ? Algunos medicamentos para la presin arterial.  Comer o beber ciertos productos.  Hacer actividad fsica. Otros factores que pueden provocar cefalea migraosa son los siguientes:  Tener el perodo menstrual.  Embarazo.  Hambre.  Estrs.  No dormir lo suficiente o dormir demasiado.  Cambios climticos.  Cansancio (fatiga). Qu incrementa el riesgo?  Tener entre 25 y 55aos de edad.  Ser mujer.  Tener antecedentes familiares de cefalea migraosa.  Ser de raza caucsica.  Tener depresin o ansiedad.  Tener mucho sobrepeso. Cules son los signos o los sntomas?  Un dolor punzante. Este dolor puede tener las siguientes caractersticas: ? Puede aparecer en cualquier regin de la cabeza, tanto de un lado como de ambos. ? Puede dificultar las actividades cotidianas. ? Puede empeorar con la actividad fsica. ? Puede empeorar con las luces brillantes o los ruidos fuertes.  Otros sntomas pueden incluir: ? Ganas de vomitar (nuseas). ? Vmitos. ? Mareos. ? Sensibilidad a las luces brillantes, los ruidos fuertes o los olores.  Antes de tener una cefalea migraosa, puede recibir seales de advertencia (aura). Un aura puede incluir: ? Ver luces intermitentes o tener puntos ciegos. ? Ver puntos brillantes, halos o lneas en  zigzag. ? Tener una visin en tnel o visin borrosa. ? Sentir entumecimiento u hormigueo. ? Tener dificultad para hablar. ? Tener msculos dbiles.  Algunas personas tienen sntomas despus de una cefalea migraosa (fase posdromal), como los siguientes: ? Cansancio. ? Dificultad para pensar (concentrarse). Cmo se trata?  Tomar medicamentos para: ? Aliviar el dolor. ? Aliviar la sensacin de malestar estomacal. ? Prevenir las cefaleas migraosas.  El tratamiento tambin puede incluir lo siguiente: ? Tomar sesiones de acupuntura. ? Evitar los alimentos que provocan las cefaleas migraosas. ? Aprender maneras de controlar las funciones corporales (biorretroalimentacin). ? [place] para ayudarlo a conocer y lidiar con los pensamientos negativos (terapia cognitivo conductual). Siga estas instrucciones en su casa: Medicamentos  Tome los medicamentos de venta libre y los recetados solamente como se lo haya indicado el mdico.  Consulte a su mdico si el medicamento que le recetaron: ? Hace que sea necesario que evite conducir o usar maquinaria pesada. ? Puede causarle dificultad para defecar (estreimiento). Es posible que deba tomar estas medidas para prevenir o tratar los problemas para defecar:  Beber suficiente lquido para mantener el pis (la orina) de color amarillo plido.  Tomar medicamentos recetados o de venta libre.  Comer alimentos ricos en fibra. Entre ellos, frijoles, cereales integrales y frutas y verduras frescas.  Limitar los alimentos con alto contenido de grasa y azcar. Estos incluyen alimentos fritos o dulces. Estilo de vida  No beba alcohol.  No consuma ningn producto que contenga nicotina o tabaco, como cigarrillos, cigarrillos electrnicos y tabaco de mascar. Si necesita ayuda para dejar de fumar, consulte al mdico.  Duerma como mnimo 8horas todas las noches.  Limite el estrs y manjelo. Indicaciones generales        Lleve un registro diario  para averiguar lo que puede provocar las cefaleas migraosas. Registre, por ejemplo, lo siguiente: ? Lo que usted come y bebe. ? El tiempo que duerme. ? Algn cambio en lo que come o bebe. ? Algn cambio en sus medicamentos.  Si tiene una cefalea migraosa: ? Evite los factores que empeoren los sntomas, como las luces brillantes. ? Resulta til acostarse en una habitacin oscura y silenciosa. ? No conduzca vehculos ni opere maquinaria pesada. ? Pregntele al mdico qu actividades son seguras para usted.  Concurra a todas las visitas de seguimiento como se lo haya indicado el mdico. Esto es importante. Comunquese con un mdico si:  Tiene una cefalea migraosa que es diferente o peor que otras que ha tenido.  Tiene ms de 15 das de cefalea por mes. Solicite ayuda inmediatamente si:  La cefalea migraosa empeora mucho.  La cefalea migraosa dura ms de 72 horas.  Tiene fiebre.  Presenta rigidez en el cuello.  Tiene dificultad para ver.  Siente debilidad en los msculos o que no puede controlarlos.  Comienza a perder el equilibrio continuamente.  Comienza a tener dificultad para caminar.  Pierde el conocimiento (se desmaya).  Tiene una convulsin. Resumen  Una cefalea migraosa es un dolor muy intenso y punzante en uno o ambos lados de la cabeza. Estos dolores de cabeza tambin pueden causar otros sntomas.  Esta afeccin puede tratarse con medicamentos y cambios en el estilo de vida.  Lleve un registro diario para averiguar lo que puede provocar las cefaleas migraosas.  Comunquese con un mdico si tiene una cefalea migraosa que es diferente o peor que otras que ha tenido.  Comunquese con el mdico si tiene ms de 15 das de cefalea en un mes. Esta informacin no tiene como fin reemplazar el consejo del mdico. Asegrese de hacerle al mdico cualquier pregunta que tenga. Document Released: 12/29/2008 Document Revised: 12/13/2018 Document Reviewed:  12/13/2018 Elsevier Patient Education  2020 Else[employer] Inc.  

## 2019-07-18 NOTE — Progress Notes (Signed)
Patient ID: Brenda Greer, female    DOB: [birth year]  MRN: [id number]  CC:  Chronic ds management  Subjective: Brenda Greer is a 33 y.o. female who presents for chronic ds management Her concerns today include:  Pt has DM, HL, GERD, Iron def anemia  DIABETES TYPE 2 Last A1C:   Results for orders placed or performed in visit on 07/18/19  HgB A1c  Result Value Ref Range   Hemoglobin A1C 11.8 (A) 4.0 - 5.6 %   HbA1c POC (<> result, manual entry)     HbA1c, POC (prediabetic range)     HbA1c, POC (controlled diabetic range)    Glucose (CBG)  Result Value Ref Range   POC Glucose 281 (A) 70 - 99 mg/dl  POCT URINALYSIS DIP (CLINITEK)  Result Value Ref Range   Color, UA yellow yellow   Clarity, UA cloudy (A) clear   Glucose, UA =500 (A) negative mg/dL   Bilirubin, UA negative negative   Ketones, POC UA small (15) (A) negative mg/dL   Spec Grav, UA 1.020 1.010 - 1.025   Blood, UA negative negative   pH, UA 5.5 5.0 - 8.0   POC PROTEIN,UA negative negative, trace   Urobilinogen, UA 0.2 0.2 or 1.0 E.U./dL   Nitrite, UA Negative Negative   Leukocytes, UA Negative Negative    Med Adherence:  _0  Yes   -taking Metformin and NPH 13 units.  Out of Glipizide 10 mg BID x 1 mth.  Reports BS were still in the 200s even on the Glucotrol Medication side effects:  _1  Yes    _2  No Home Monitoring?  _3  Yes BID before BF and after BF Home glucose results range: before BF 240-280 and after BF 200s Diet Adherence: _4  Yes    _5  No Exercise: _6  Yes    _7  No.  Difficulty getting exercise as her kids are doing school from home Hypoglycemic episodes?: _8  Yes    _9  No Numbness of the feet? _10  Yes    _11  No.  Doing well on gabapentin Retinopathy hx? _12  Yes    _13  No Last eye exam: did not go to get eye exam.  She would like to schedule one but needs to see a provider who speaks English Comments:   C/o HA that started 2 wks ago Can last 2-3 days Unilateral on either side  but for the past 2 days it has been on the RT side No blurred vision +photophobia, + Nausea No triggers. Nothing makes it worse Has tried taking Tylenol without improvement.  Took Q 8hrs.  HL: Reports compliance with Pravachol.  Tolerating it okay.  Patient Active Problem List   Diagnosis Date Noted  . Uncontrolled type 2 diabetes mellitus with peripheral neuropathy ([place]) 07/18/2019  . Migraine without aura and without status migrainosus, not intractable 07/18/2019  . Hyperlipidemia associated with type 2 diabetes mellitus ([place]) 07/18/2019  . Iron deficiency anemia due to chronic blood loss 07/02/2018  . Hyperlipidemia 02/26/2018  . Gastroesophageal reflux disease without esophagitis 02/26/2018  . Diabetes mellitus without complication ([place]) 40/98/1191  . Type II or unspecified type diabetes mellitus without mention of complication, uncontrolled 05/19/2013  . OBESITY, NOS 12/13/2006     Current Outpatient Medications on File Prior to Visit  Medication Sig Dispense Refill  . Blood Glucose Monitoring Suppl (TRUE METRIX METER) w/Device KIT Use as directed 1 kit 0  . ferrous sulfate 325 (65 FE) MG EC tablet TAKE 1 TABLET BY MOUTH ONCE DAILY WITH  BREAKFAST 100 tablet 0  . glucose blood (TRUE METRIX BLOOD GLUCOSE TEST) test strip Use as instructed 100 each 12  . Insulin Syringe-Needle U-100 (INSULIN SYRINGE 1CC/31GX5/16") 31G X 5/16" 1 ML MISC Use as directed 100 each 6  . Multiple Vitamin (MULTIVITAMIN WITH MINERALS) TABS Take 1 tablet by mouth daily.    . pantoprazole (PROTONIX) 20 MG tablet Take 1 tablet (20 mg total) by mouth daily. 30 tablet 2  . TRUEPLUS LANCETS 28G MISC Use as directed 100 each 6   No current facility-administered medications on file prior to visit.     No Known Allergies  Social History   Socioeconomic History  . Marital status: Single    Spouse name: Not on file  . Number of children: Not on file  . Years of education: Not on file  . Highest education  level: Not on file  Occupational History  . Not on file  Social Needs  . Financial resource strain: Not on file  . Food insecurity    Worry: Not on file    Inability: Not on file  . Transportation needs    Medical: Not on file    Non-medical: Not on file  Tobacco Use  . Smoking status: Never Smoker  . Smokeless tobacco: Never Used  Substance and Sexual Activity  . Alcohol use: No  . Drug use: No  . Sexual activity: Not on file  Lifestyle  . Physical activity    Days per week: Not on file    Minutes per session: Not on file  . Stress: Not on file  Relationships  . Social Herbalist on phone: Not on file    Gets together: Not on file    Attends religious service: Not on file    Active member of club or organization: Not on file    Attends meetings of clubs or organizations: Not on file    Relationship status: Not on file  . Intimate partner violence    Fear of current or ex partner: Not on file    Emotionally abused: Not on file    Physically abused: Not on file    Forced sexual activity: Not on file  Other Topics Concern  . Not on file  Social History Narrative  . Not on file    Family History  Problem Relation Age of Onset  . Diabetes Mother   . Hypertension Mother     History reviewed. No pertinent surgical history.  ROS: Review of Systems Negative except as stated above  PHYSICAL EXAM: BP 109/73 (BP Location: Left Arm, Patient Position: Sitting, Cuff Size: Normal)   Pulse 98   Temp 97.8 F (36.6 C) (Oral)   Resp 18   Ht 5' (1.524 m)   Wt 159 lb (72.1 kg)   LMP 07/10/2019   SpO2 100%   BMI 31.05 kg/m   Physical Exam  General appearance - alert, well appearing, and in no distress Mental status - normal mood, behavior, speech, dress, motor activity, and thought processes Neck - supple, no significant adenopathy Chest - clear to auscultation, no wheezes, rales or rhonchi, symmetric air entry Heart - normal rate, regular rhythm, normal  S1, S2, no murmurs, rubs, clicks or gallops.  No carotid bruits. Neurological - cranial nerves II through XII intact, motor and sensory grossly normal bilaterally, Romberg sign negative, normal gait and station Extremities - peripheral pulses normal, no pedal edema, no clubbing or cyanosis  A1C 11.8/BS 281  CMP  Latest Ref Rng & Units 11/07/2018 09/10/2017 04/09/2017  Glucose 65 - 99 mg/dL 277(H) 171(H) 192(H)  BUN 6 - 20 mg/dL _0 Creatinine 0.57 - 1.00 mg/dL 0.49(L) 0.50(L) 0.49(L)  Sodium 134 - 144 mmol/L 137 140 137  Potassium 3.5 - 5.2 mmol/L 4.4 4.1 4.0  Chloride 96 - 106 mmol/L 102 104 103  CO2 20 - 29 mmol/L _1 Calcium 8.7 - 10.2 mg/dL 9.6 9.1 9.3  Total Protein 6.0 - 8.5 g/dL 7.3 7.2 6.8  Total Bilirubin 0.0 - 1.2 mg/dL 0.4 <0.2 <0.2  Alkaline Phos 39 - 117 IU/L 72 64 58  AST 0 - 40 IU/L 34 21 26  ALT 0 - 32 IU/L _2 Lipid Panel     Component Value Date/Time   CHOL 202 (H) 11/07/2018 1222   TRIG 86 11/07/2018 1222   HDL 63 11/07/2018 1222   CHOLHDL 3.2 11/07/2018 1222   CHOLHDL 4.2 05/19/2013 1703   VLDL 38 05/19/2013 1703   LDLCALC 122 (H) 11/07/2018 1222    CBC    Component Value Date/Time   WBC 5.6 11/07/2018 1222   WBC 9.4 04/06/2013 2035   RBC 5.05 11/07/2018 1222   RBC 4.10 04/06/2013 2035   HGB 11.1 11/07/2018 1222   HCT 37.1 11/07/2018 1222   PLT 280 11/07/2018 1222   MCV 74 (L) 11/07/2018 1222   MCH 22.0 (L) 11/07/2018 1222   MCH 24.9 (L) 04/06/2013 2035   MCHC 29.9 (L) 11/07/2018 1222   MCHC 32.3 04/06/2013 2035   RDW 16.4 (H) 11/07/2018 1222   LYMPHSABS 2.9 04/06/2013 2035   MONOABS 0.4 04/06/2013 2035   EOSABS 0.1 04/06/2013 2035   BASOSABS 0.0 04/06/2013 2035    ASSESSMENT AND PLAN:  1. Uncontrolled type 2 diabetes mellitus with peripheral neuropathy ([place]) Encourage healthy eating habits. Encouraged her to try to get in some exercise if only for 20 minutes 3 days a week. We will submit referral to ophthalmology and put in  the comment section that she needs to see a provider who can speak Spanish. Increase Humulin N to 17 units.  Refill Glucotrol.  Continue metformin.  She would be a good candidate for Trulicity but unfortunately her insurance probably will not cover for this - HgB A1c - Glucose (CBG) - POCT URINALYSIS DIP (CLINITEK) - Ambulatory referral to Ophthalmology - insulin NPH Human (NOVOLIN N) 100 UNIT/ML injection; Inject 0.17 mLs (17 Units total) into the skin at bedtime.  Dispense: 10 mL; Refill: 11 - glipiZIDE (GLUCOTROL) 10 MG tablet; Take 1 tablet (10 mg total) by mouth 2 (two) times daily before a meal.  Dispense: 60 tablet; Refill: 6 - gabapentin (NEURONTIN) 300 MG capsule; Take 1 capsule (300 mg total) by mouth at bedtime.  Dispense: 30 capsule; Refill: 4  2. Migraine without aura and without status migrainosus, not intractable Suggest migraine headache without aura.  Discussed diagnosis and management.  We will put her on a low dose of Topamax at bedtime for prophylaxis.  Will use Imitrex for abortive therapy.  I went over with her how to take the medication. Encourage healthy eating habits, try to get in 7 to 8 hours of sleep each night.  Try to get in regular exercise. - topiramate (TOPAMAX) 25 MG tablet; Take 1 tablet (25 mg total) by mouth 2 (two) times daily.  Dispense: 30 tablet; Refill: 1 - SUMAtriptan (IMITREX) 50 MG tablet; take 1 tab at start of Headache.  May repeat in 2 hrs if no relief.  Max 2 tabs/24 hrs.  Dispense: 10 tablet; Refill: 2  3. Hyperlipidemia associated with type 2 diabetes mellitus ([facility]) Continue Pravachol - pravastatin (PRAVACHOL) 20 MG tablet; Take 1 & 1/2 (one & one-half) tablets by mouth once daily. Must have office visit for refills  Dispense: 45 tablet; Refill: 0  4. Influenza vaccination declined 5. Tetanus, diphtheria, and acellular pertussis (Tdap) vaccination declined     Patient was given the opportunity to ask questions.  Patient verbalized  understanding of the plan and was able to repeat key elements of the plan.   Orders Placed This Encounter  Procedures  . Ambulatory referral to Ophthalmology  . HgB A1c  . Glucose (CBG)  . POCT URINALYSIS DIP (CLINITEK)     Requested Prescriptions   Signed Prescriptions Disp Refills  . topiramate (TOPAMAX) 25 MG tablet 30 tablet 1    Sig: Take 1 tablet (25 mg total) by mouth 2 (two) times daily.  . SUMAtriptan (IMITREX) 50 MG tablet 10 tablet 2    Sig: take 1 tab at start of Headache.  May repeat in 2 hrs if no relief.  Max 2 tabs/24 hrs.  . insulin NPH Human (NOVOLIN N) 100 UNIT/ML injection 10 mL 11    Sig: Inject 0.17 mLs (17 Units total) into the skin at bedtime.  Marland Kitchen glipiZIDE (GLUCOTROL) 10 MG tablet 60 tablet 6    Sig: Take 1 tablet (10 mg total) by mouth 2 (two) times daily before a meal.  . gabapentin (NEURONTIN) 300 MG capsule 30 capsule 4    Sig: Take 1 capsule (300 mg total) by mouth at bedtime.  . pravastatin (PRAVACHOL) 20 MG tablet 45 tablet 0    Sig: Take 1 & 1/2 (one & one-half) tablets by mouth once daily. Must have office visit for refills  . metFORMIN (GLUCOPHAGE XR) 500 MG 24 hr tablet 60 tablet 6    Sig: Take 2 tablets (1,000 mg total) by mouth 2 (two) times daily.    Return in about 6 weeks (around 08/29/2019) for f/u on headache.  Karle Plumber, MD, FACP

## 2019-08-19 ENCOUNTER — Other Ambulatory Visit: Payer: Self-pay | Admitting: Internal Medicine

## 2019-08-19 DIAGNOSIS — E1169 Type 2 diabetes mellitus with other specified complication: Secondary | ICD-10-CM

## 2019-08-31 ENCOUNTER — Other Ambulatory Visit: Payer: Self-pay | Admitting: Internal Medicine

## 2019-08-31 DIAGNOSIS — E1169 Type 2 diabetes mellitus with other specified complication: Secondary | ICD-10-CM

## 2019-10-13 ENCOUNTER — Other Ambulatory Visit: Payer: Self-pay | Admitting: Internal Medicine

## 2019-10-13 DIAGNOSIS — IMO0002 Reserved for concepts with insufficient information to code with codable children: Secondary | ICD-10-CM

## 2019-10-13 DIAGNOSIS — E1142 Type 2 diabetes mellitus with diabetic polyneuropathy: Secondary | ICD-10-CM

## 2019-10-21 ENCOUNTER — Ambulatory Visit: Payer: Commercial Managed Care - PPO | Admitting: Internal Medicine

## 2019-11-14 ENCOUNTER — Ambulatory Visit: Payer: Commercial Managed Care - PPO | Attending: Internal Medicine | Admitting: Internal Medicine

## 2019-11-14 DIAGNOSIS — E1142 Type 2 diabetes mellitus with diabetic polyneuropathy: Secondary | ICD-10-CM | POA: Diagnosis not present

## 2019-11-14 DIAGNOSIS — E1165 Type 2 diabetes mellitus with hyperglycemia: Secondary | ICD-10-CM

## 2019-11-14 DIAGNOSIS — IMO0002 Reserved for concepts with insufficient information to code with codable children: Secondary | ICD-10-CM

## 2019-11-14 DIAGNOSIS — Z2821 Immunization not carried out because of patient refusal: Secondary | ICD-10-CM

## 2019-11-14 DIAGNOSIS — G43009 Migraine without aura, not intractable, without status migrainosus: Secondary | ICD-10-CM | POA: Diagnosis not present

## 2019-11-14 DIAGNOSIS — E1169 Type 2 diabetes mellitus with other specified complication: Secondary | ICD-10-CM

## 2019-11-14 DIAGNOSIS — E785 Hyperlipidemia, unspecified: Secondary | ICD-10-CM

## 2019-11-14 MED ORDER — INSULIN NPH (HUMAN) (ISOPHANE) 100 UNIT/ML ~~LOC~~ SUSP: 19.0000 [IU] | Freq: Every day | SUBCUTANEOUS | 11 refills | Status: DC

## 2019-11-14 MED ORDER — SUMATRIPTAN SUCCINATE 50 MG PO TABS: ORAL_TABLET | ORAL | 2 refills | Status: DC

## 2019-11-14 NOTE — Progress Notes (Signed)
Virtual Visit via Telephone Note Due to current restrictions/limitations of in-office visits due to the COVID-19 pandemic, this scheduled clinical appointment was converted to a telehealth visit  I connected with Brenda Greer on 11/14/19 at 10:24 a.m by telephone and verified that I am speaking with the correct person using two identifiers. I am in my office.  The patient is at home.  Only the patient, myself and Joellen Jersey from [employer] [phone]) participated in this encounter.  I discussed the limitations, risks, security and privacy concerns of performing an evaluation and management service by telephone and the availability of in person appointments. I also discussed with the patient that there may be a patient responsible charge related to this service. The patient expressed understanding and agreed to proceed.   History of Present Illness: Pt has DM, HL, GERD, Iron def anemia, migraines without aura.  Last seen 07/2019.  Purpose of today's visit is chronic disease management.  DM: checking BS once before BF.  Gives range 140-150.  Reports compliance with NPH insulin 17 units daily and oral meds Metformin and Glucotrol No hypoglycemia.  Referred for eye exam on last visit.  Letter mailed to pt with referral info but she reports she never received it. Doing well with eating habits.  Not getting in any exercise.  "I just don't do it." No numbness in hands/feet.  HL:   Taking and tolerating Pravachol  Migraines:  Dx on last visit.  Prescribed Topamax and Imitrex.  Pt never got rxns.  Sent to [facility] on [address] but pt states she wanted it sent to [facility] on [address].  However pt states she has not been bothered by HA recently.   Outpatient Encounter Medications as of 11/14/2019  Medication Sig  . Blood Glucose Monitoring Suppl (TRUE METRIX METER) w/Device KIT Use as directed  . ferrous sulfate 325 (65 FE) MG EC tablet TAKE 1 TABLET BY MOUTH ONCE DAILY WITH  BREAKFAST  . gabapentin (NEURONTIN) 300 MG capsule Take 1 capsule by mouth at bedtime  . glipiZIDE (GLUCOTROL) 10 MG tablet Take 1 tablet (10 mg total) by mouth 2 (two) times daily before a meal.  . glucose blood (TRUE METRIX BLOOD GLUCOSE TEST) test strip Use as instructed  . insulin NPH Human (NOVOLIN N) 100 UNIT/ML injection Inject 0.17 mLs (17 Units total) into the skin at bedtime.  . Insulin Syringe-Needle U-100 (INSULIN SYRINGE 1CC/31GX5/16") 31G X 5/16" 1 ML MISC Use as directed  . metFORMIN (GLUCOPHAGE XR) 500 MG 24 hr tablet Take 2 tablets (1,000 mg total) by mouth 2 (two) times daily.  . Multiple Vitamin (MULTIVITAMIN WITH MINERALS) TABS Take 1 tablet by mouth daily.  . pantoprazole (PROTONIX) 20 MG tablet Take 1 tablet (20 mg total) by mouth daily.  . pravastatin (PRAVACHOL) 20 MG tablet Take 1 & 1/2 (one & one-half) tablets by mouth once daily.  . SUMAtriptan (IMITREX) 50 MG tablet take 1 tab at start of Headache.  May repeat in 2 hrs if no relief.  Max 2 tabs/24 hrs.  . topiramate (TOPAMAX) 25 MG tablet Take 1 tablet (25 mg total) by mouth at bedtime.  . TRUEPLUS LANCETS 28G MISC Use as directed   No facility-administered encounter medications on file as of 11/14/2019.      Observations/Objective: Results for orders placed or performed in visit on 07/18/19  HgB A1c  Result Value Ref Range   Hemoglobin A1C 11.8 (A) 4.0 - 5.6 %   HbA1c POC (<> result,  manual entry)     HbA1c, POC (prediabetic range)     HbA1c, POC (controlled diabetic range)    Glucose (CBG)  Result Value Ref Range   POC Glucose 281 (A) 70 - 99 mg/dl  POCT URINALYSIS DIP (CLINITEK)  Result Value Ref Range   Color, UA yellow yellow   Clarity, UA cloudy (A) clear   Glucose, UA =500 (A) negative mg/dL   Bilirubin, UA negative negative   Ketones, POC UA small (15) (A) negative mg/dL   Spec Grav, UA 1.020 1.010 - 1.025   Blood, UA negative negative   pH, UA 5.5 5.0 - 8.0   POC PROTEIN,UA negative negative,  trace   Urobilinogen, UA 0.2 0.2 or 1.0 E.U./dL   Nitrite, UA Negative Negative   Leukocytes, UA Negative Negative     Assessment and Plan: 1. Uncontrolled type 2 diabetes mellitus with peripheral neuropathy ([place]) Reported blood sugar readings are better but not at goal.  Our goal for fasting blood sugars are 90-130.  Increase NPH insulin to 19 units.  Encouraged her to try to get in some exercise at least 3 to 4 days a week for 30 minutes - Hemoglobin A1c; Future - Microalbumin / creatinine urine ratio; Future - CBC; Future - Comprehensive metabolic panel; Future - Lipid panel; Future - insulin NPH Human (NOVOLIN N) 100 UNIT/ML injection; Inject 0.19 mLs (19 Units total) into the skin at bedtime.  Dispense: 10 mL; Refill: 11  2. Hyperlipidemia associated with type 2 diabetes mellitus ([place]) Continue statin therapy.  3. Migraine without aura and without status migrainosus, not intractable Patient indicates that the prescription was sent to the wrong pharmacy but she is currently not bothered with headaches.  I told her that I will go ahead and redirect the prescription for the Imitrex to the [facility] so that she can have it if headaches return - SUMAtriptan (IMITREX) 50 MG tablet; take 1 tab at start of Headache.  May repeat in 2 hrs if no relief.  Max 2 tabs/24 hrs.  Dispense: 10 tablet; Refill: 2  4. 23-polyvalent pneumococcal polysaccharide vaccine declined This was offered.  Patient declined   Follow Up Instructions: 4 mths   I discussed the assessment and treatment plan with the patient. The patient was provided an opportunity to ask questions and all were answered. The patient agreed with the plan and demonstrated an understanding of the instructions.   The patient was advised to call back or seek an in-person evaluation if the symptoms worsen or if the condition fails to improve as anticipated.  I provided 18 minutes of non-face-to-face time during this encounter.   Karle Plumber, MD

## 2019-11-22 ENCOUNTER — Other Ambulatory Visit: Payer: Self-pay | Admitting: Internal Medicine

## 2019-11-25 ENCOUNTER — Ambulatory Visit: Payer: Commercial Managed Care - PPO | Attending: Internal Medicine

## 2019-11-25 ENCOUNTER — Other Ambulatory Visit: Payer: Self-pay

## 2019-11-25 DIAGNOSIS — E1142 Type 2 diabetes mellitus with diabetic polyneuropathy: Secondary | ICD-10-CM

## 2019-11-25 DIAGNOSIS — IMO0002 Reserved for concepts with insufficient information to code with codable children: Secondary | ICD-10-CM

## 2019-11-26 ENCOUNTER — Other Ambulatory Visit: Payer: Self-pay | Admitting: Internal Medicine

## 2019-11-26 DIAGNOSIS — IMO0002 Reserved for concepts with insufficient information to code with codable children: Secondary | ICD-10-CM

## 2019-11-26 DIAGNOSIS — D509 Iron deficiency anemia, unspecified: Secondary | ICD-10-CM

## 2019-11-26 DIAGNOSIS — E1142 Type 2 diabetes mellitus with diabetic polyneuropathy: Secondary | ICD-10-CM

## 2019-11-26 LAB — CBC
Hematocrit: 35.7 % (ref 34.0–46.6)
Hemoglobin: 10.4 g/dL — ABNORMAL LOW (ref 11.1–15.9)
MCH: 21.4 pg — ABNORMAL LOW (ref 26.6–33.0)
MCHC: 29.1 g/dL — ABNORMAL LOW (ref 31.5–35.7)
MCV: 73 fL — ABNORMAL LOW (ref 79–97)
Platelets: 256 10*3/uL (ref 150–450)
RBC: 4.87 x10E6/uL (ref 3.77–5.28)
RDW: 17 % — ABNORMAL HIGH (ref 11.7–15.4)
WBC: 7.6 10*3/uL (ref 3.4–10.8)

## 2019-11-26 LAB — LIPID PANEL
Chol/HDL Ratio: 3.2 ratio (ref 0.0–4.4)
Cholesterol, Total: 184 mg/dL (ref 100–199)
HDL: 58 mg/dL (ref >39)
LDL Chol Calc (NIH): 106 mg/dL — ABNORMAL HIGH (ref 0–99)
Triglycerides: 114 mg/dL (ref 0–149)
VLDL Cholesterol Cal: 20 mg/dL (ref 5–40)

## 2019-11-26 LAB — COMPREHENSIVE METABOLIC PANEL
ALT: 43 IU/L — ABNORMAL HIGH (ref 0–32)
AST: 41 IU/L — ABNORMAL HIGH (ref 0–40)
Albumin/Globulin Ratio: 1.3 (ref 1.2–2.2)
Albumin: 3.8 g/dL (ref 3.8–4.8)
Alkaline Phosphatase: 82 IU/L (ref 39–117)
BUN/Creatinine Ratio: 17 (ref 9–23)
BUN: 8 mg/dL (ref 6–20)
Bilirubin Total: 0.3 mg/dL (ref 0.0–1.2)
CO2: 19 mmol/L — ABNORMAL LOW (ref 20–29)
Calcium: 9 mg/dL (ref 8.7–10.2)
Chloride: 103 mmol/L (ref 96–106)
Creatinine, Ser: 0.47 mg/dL — ABNORMAL LOW (ref 0.57–1.00)
GFR calc Af Amer: 149 mL/min/{1.73_m2} (ref >59)
GFR calc non Af Amer: 129 mL/min/{1.73_m2} (ref >59)
Globulin, Total: 2.9 g/dL (ref 1.5–4.5)
Glucose: 266 mg/dL — ABNORMAL HIGH (ref 65–99)
Potassium: 3.9 mmol/L (ref 3.5–5.2)
Sodium: 138 mmol/L (ref 134–144)
Total Protein: 6.7 g/dL (ref 6.0–8.5)

## 2019-11-26 LAB — MICROALBUMIN / CREATININE URINE RATIO
Creatinine, Urine: 127.3 mg/dL
Microalb/Creat Ratio: 23 mg/g creat (ref 0–29)
Microalbumin, Urine: 29.9 ug/mL

## 2019-11-26 LAB — HEMOGLOBIN A1C
Est. average glucose Bld gHb Est-mCnc: 303 mg/dL
Hgb A1c MFr Bld: 12.2 % — ABNORMAL HIGH (ref 4.8–5.6)

## 2019-11-26 MED ORDER — INSULIN NPH (HUMAN) (ISOPHANE) 100 UNIT/ML ~~LOC~~ SUSP: SUBCUTANEOUS | 11 refills | Status: DC

## 2019-11-26 MED ORDER — FERROUS SULFATE 325 (65 FE) MG PO TBEC: DELAYED_RELEASE_TABLET | ORAL | 1 refills | Status: DC

## 2019-12-03 ENCOUNTER — Telehealth: Payer: Self-pay

## 2019-12-03 ENCOUNTER — Telehealth: Payer: Self-pay | Admitting: Internal Medicine

## 2019-12-03 NOTE — Telephone Encounter (Signed)
[employer] Elmyra Ricks  Id# [id number]  contacted pt to go over lab results pt didn't answer lvm asking pt to give a call back at her earliest convenience

## 2019-12-03 NOTE — Telephone Encounter (Signed)
Patient called returning your call about lab results . Thank you .

## 2019-12-05 NOTE — Telephone Encounter (Signed)
[employer] Brenda Greer  Id#  [id number] returned pt call to go over lab results pt is aware of results and is schedule to see luke in 3 weeks. Pt doesn't have any questions or concerns

## 2019-12-25 NOTE — Progress Notes (Signed)
S:    PCP: Dr. Laural Benes  No chief complaint on file.  Patient arrives in good spirits. Presents for diabetes evaluation, education, and management Patient was referred and last seen by Primary Care Provider on 11/14/19. At that visit, clinic BG was 281 and home fasting sugars ranged 140-150. Patient reported compliance and NPH was increased from 17 units to 19 units. Then, CMP results on 11/25/19 reported an elevated sugar of 266 in which NPH novolin was adjusted to 10 units AM and 17 units PM.  Family/Social History: FHx: Mother - diabetes, hypertension Tobacco use: never smoker, denies smokeless tobacco Alcohol: denies   Insurance coverage/medication affordability: UMR/UHC PPO  Patient reports adherence with medications, however takes NPH 10 units qAM and "18" units qPM, Metformin 500 mg BID and does not take Glipizide 10 mg BID. Patient reports home fasting and post-prandial sugars to be in the 200-300s. Clinic fasting glucose was elevated at 233 mg/dL. Patient reports taking meds this morning and not eating breakfast. Patient also reports frequent urination during the nighttime.  Current diabetes medications include: Glipizide 10 mg BID (not taking), NPH Novolin 10 units AM and 17 units PM (taking 18 units qPM), Metformin XR 1000 mg BID (taking 500 mg BID) Current hypertension medications include: none Current hyperlipidemia medications include: Pravastatin 20 mg - 1.5 tablets daily  Patient denies hypoglycemic events.   Patient reports nocturia (nighttime urination).  Patient denies neuropathy (nerve pain). Patient denies visual changes.   O:   Home fasting blood sugars: 290, 300s 2 hour post-meal/random blood sugars: 200, 190, 290  Lab Results  Component Value Date   HGBA1C 12.2 (H) 11/25/2019   There were no vitals filed for this visit.  Lipid Panel     Component Value Date/Time   CHOL 184 11/25/2019 0918   TRIG 114 11/25/2019 0918   HDL 58 11/25/2019 0918   CHOLHDL 3.2 11/25/2019 0918   CHOLHDL 4.2 05/19/2013 1703   VLDL 38 05/19/2013 1703   LDLCALC 106 (H) 11/25/2019 0918    Clinical Atherosclerotic Cardiovascular Disease (ASCVD): No  The ASCVD Risk score Denman George DC Jr., et al., 2013) failed to calculate for the following reasons:   The 2013 ASCVD risk score is only valid for ages 34 to 64    A/P: Diabetes longstanding with A1C 12.2% currently not a goal. Patient is not adherent with medications. Patient takes 1 tablet of Metformin XR 500 mg BID; has not been taking glipizide and it was not in her med bag. Patient is experiencing nocturia which is related to her high sugars 200-300s. Control is suboptimal due to elevated sugars and A1C not at goal. Trulicity 0.75 mg weekly was initiated for additional sugar control. Trulicity has a $25 copay and patient agreed that she can afford this medication. Patient was educated on the proper administration technique and was able to explain the technique via the teachback method. Instructed patient to start taking 2 tablets of metformin XR 1000 mg BID as prescribed. Patient verbalize understanding of these med changes. -Started Trulicity 0.75 mg weekly -Discontinued Glipizide 10 mg BID -Continued NPH Novolin 10 units AM and 18 units PM. -Continued Metformin XR 1000 mg BID -Extensively discussed pathophysiology of diabetes, recommended lifestyle interventions, dietary effects on blood sugar control -Counseled on s/sx of and management of hypoglycemia -Next A1C anticipated 02/2020.    Written patient instructions provided.  Total time in face to face counseling 25 minutes.   Follow up Pharmacist Clinic Visit in 1.5 weeks.  Patient seen with:  Lorel Monaco, PharmD PGY1 Ambulatory Care Resident [place], PharmD, [place] [phone]

## 2019-12-26 ENCOUNTER — Other Ambulatory Visit: Payer: Self-pay

## 2019-12-26 ENCOUNTER — Ambulatory Visit: Payer: Commercial Managed Care - PPO | Attending: Internal Medicine | Admitting: Pharmacist

## 2019-12-26 DIAGNOSIS — E1165 Type 2 diabetes mellitus with hyperglycemia: Secondary | ICD-10-CM

## 2019-12-26 DIAGNOSIS — E1142 Type 2 diabetes mellitus with diabetic polyneuropathy: Secondary | ICD-10-CM

## 2019-12-26 DIAGNOSIS — IMO0002 Reserved for concepts with insufficient information to code with codable children: Secondary | ICD-10-CM

## 2019-12-26 LAB — GLUCOSE, POCT (MANUAL RESULT ENTRY): POC Glucose: 233 mg/dl — AB (ref 70–99)

## 2019-12-26 MED ORDER — INSULIN NPH (HUMAN) (ISOPHANE) 100 UNIT/ML ~~LOC~~ SUSP: SUBCUTANEOUS | 11 refills | Status: DC

## 2019-12-26 MED ORDER — TRULICITY 0.75 MG/0.5ML ~~LOC~~ SOAJ: 0.7500 mg | SUBCUTANEOUS | 1 refills | Status: DC

## 2020-01-01 NOTE — Progress Notes (Signed)
    S:    PCP: Dr. Laural Benes  No chief complaint on file.  Patient arrives in good spirits. Presents for diabetes evaluation, education, and management. Patient was referred and last seen by PCP on 11/14/19 and last seen by the Clinical Pharmacist 12/26/19. Trulicity was initiated and glipizide was discontinued.  Family/Social History: FHx: Mother - diabetes, hypertension Tobacco use: never smoker, denies smokeless tobacco Alcohol: denies   Insurance coverage/medication affordability: UMR/UHC PPO  Patient reports adherence with medications. Patient started Trulicity this Wednesday (3/17). Current diabetes medications include:Trulicty 0.75 mg weekly, NPH Novolin 10 units AM and 18 units PM, Metformin XR 1000 mg BID  Current hyperlipidemia medications include: Pravastatin 20 mg - 1.5 tablets daily  Patient denies hypoglycemic events.  Patient reported dietary habits: trying to eat more salads and vegetables  Patient-reported exercise habits: trying to walk 30 mins eveyday   Patient denies nocturia (nighttime urination).  Patient denies neuropathy (nerve pain). Patient denies visual changes.   O:  POCT: 160 mg/dL  Home fasting blood sugars: 240, 260   Lab Results  Component Value Date   HGBA1C 12.2 (H) 11/25/2019   There were no vitals filed for this visit.  Lipid Panel     Component Value Date/Time   CHOL 184 11/25/2019 0918   TRIG 114 11/25/2019 0918   HDL 58 11/25/2019 0918   CHOLHDL 3.2 11/25/2019 0918   CHOLHDL 4.2 05/19/2013 1703   VLDL 38 05/19/2013 1703   LDLCALC 106 (H) 11/25/2019 0918    Clinical Atherosclerotic Cardiovascular Disease (ASCVD): No  The ASCVD Risk score Denman George DC Jr., et al., 2013) failed to calculate for the following reasons:   The 2013 ASCVD risk score is only valid for ages 27 to 108    A/P: Diabetes longstanding currently uncontrolled. Patient is able to verbalize appropriate hypoglycemia management plan. Patient is adherent with  medications. Counseled patient to check sugars in the morning before breakfast and right before bedtime.  -Increased NPH to 21 units qPM and continued 10 units in qAM. -Continue Trulicity 0.75 mg weekly -Continued Metformin XR 1000 mg BID.  -Extensively discussed pathophysiology of diabetes, recommended lifestyle interventions, dietary effects on blood sugar control -Counseled on s/sx of and management of hypoglycemia -Next A1C anticipated 02/2020.   Written patient instructions provided.  Total time in face to face counseling 20 minutes.    Follow up Pharmacist Clinic Visit in 2 weeks.     Patient seen with: Fabio Neighbors, PharmD PGY1 Ambulatory Care Resident [facility]

## 2020-01-02 ENCOUNTER — Other Ambulatory Visit: Payer: Self-pay

## 2020-01-02 ENCOUNTER — Ambulatory Visit: Payer: Commercial Managed Care - PPO | Attending: Internal Medicine | Admitting: Pharmacist

## 2020-01-02 DIAGNOSIS — E1142 Type 2 diabetes mellitus with diabetic polyneuropathy: Secondary | ICD-10-CM

## 2020-01-02 DIAGNOSIS — IMO0002 Reserved for concepts with insufficient information to code with codable children: Secondary | ICD-10-CM

## 2020-01-02 DIAGNOSIS — E1165 Type 2 diabetes mellitus with hyperglycemia: Secondary | ICD-10-CM | POA: Diagnosis not present

## 2020-01-02 DIAGNOSIS — E1169 Type 2 diabetes mellitus with other specified complication: Secondary | ICD-10-CM

## 2020-01-02 DIAGNOSIS — E785 Hyperlipidemia, unspecified: Secondary | ICD-10-CM

## 2020-01-02 LAB — GLUCOSE, POCT (MANUAL RESULT ENTRY): POC Glucose: 160 mg/dl — AB (ref 70–99)

## 2020-01-02 MED ORDER — ONETOUCH VERIO VI STRP: ORAL_STRIP | 12 refills | Status: DC

## 2020-01-02 MED ORDER — PRAVASTATIN SODIUM 20 MG PO TABS: ORAL_TABLET | ORAL | 2 refills | Status: DC

## 2020-01-02 MED ORDER — ONETOUCH VERIO W/DEVICE KIT: PACK | 0 refills | Status: DC

## 2020-01-02 MED ORDER — ONETOUCH DELICA LANCETS 33G MISC: 6 refills | Status: DC

## 2020-01-19 ENCOUNTER — Encounter: Payer: Self-pay | Admitting: Pharmacist

## 2020-01-19 ENCOUNTER — Other Ambulatory Visit: Payer: Self-pay

## 2020-01-19 ENCOUNTER — Ambulatory Visit: Payer: Commercial Managed Care - PPO | Attending: Internal Medicine | Admitting: Pharmacist

## 2020-01-19 DIAGNOSIS — E1142 Type 2 diabetes mellitus with diabetic polyneuropathy: Secondary | ICD-10-CM | POA: Diagnosis not present

## 2020-01-19 DIAGNOSIS — E1165 Type 2 diabetes mellitus with hyperglycemia: Secondary | ICD-10-CM | POA: Diagnosis not present

## 2020-01-19 DIAGNOSIS — IMO0002 Reserved for concepts with insufficient information to code with codable children: Secondary | ICD-10-CM

## 2020-01-19 NOTE — Progress Notes (Signed)
    S:    PCP: Dr. Laural Benes  No chief complaint on file.  Patient arrives in good spirits. Presents for diabetes evaluation, education, and management. Patient was referred and last seen by PCP on 11/14/19 and last seen by the Clinical Pharmacist 01/02/20. NPH was adjusted; metformin and Trulicity continued at current doses.   Family/Social History: FHx: Mother - diabetes, hypertension Tobacco use: never smoker, denies smokeless tobacco Alcohol: denies   Insurance coverage/medication affordability: UMR/UHC PPO  Patient reports adherence with medications.  Current diabetes medications include:Trulicty 0.75 mg weekly, NPH Novolin 10 units AM and 21 units PM, Metformin XR 1000 mg BID  Current hyperlipidemia medications include: Pravastatin 20 mg - 1.5 tablets daily  Patient denies hypoglycemic events.  Patient reported dietary habits: trying to eat more salads and vegetables  Patient-reported exercise habits: trying to walk 30 mins eveyday   Patient denies nocturia (nighttime urination).  Patient denies neuropathy (nerve pain). Patient denies visual changes.   O:  POCT: 134 mg/dL  Home fasting blood sugars: 120s-130s   Lab Results  Component Value Date   HGBA1C 12.2 (H) 11/25/2019   There were no vitals filed for this visit.  Lipid Panel     Component Value Date/Time   CHOL 184 11/25/2019 0918   TRIG 114 11/25/2019 0918   HDL 58 11/25/2019 0918   CHOLHDL 3.2 11/25/2019 0918   CHOLHDL 4.2 05/19/2013 1703   VLDL 38 05/19/2013 1703   LDLCALC 106 (H) 11/25/2019 0918    Clinical Atherosclerotic Cardiovascular Disease (ASCVD): No  The ASCVD Risk score Denman George DC Jr., et al., 2013) failed to calculate for the following reasons:   The 2013 ASCVD risk score is only valid for ages 75 to 27    A/P: Diabetes longstanding currently uncontrolled. Per patient, home CBGs have improved drastically. Patient is able to verbalize appropriate hypoglycemia management plan. Patient is  adherent with medications. Given improvement, will hold off on any further changes.  -Continued NPH 21 units qPM, 10 units in qAM. -Continue Trulicity 0.75 mg weekly -Continued Metformin XR 1000 mg BID.  -Extensively discussed pathophysiology of diabetes, recommended lifestyle interventions, dietary effects on blood sugar control -Counseled on s/sx of and management of hypoglycemia -Next A1C anticipated 02/2020.   Written patient instructions provided.  Total time in face to face counseling 20 minutes.    Follow up Pharmacist Clinic Visit in May.     [name], PharmD, CPP Clinical Pharmacist [facility] & [facility] [phone]

## 2020-02-18 ENCOUNTER — Other Ambulatory Visit: Payer: Self-pay

## 2020-02-18 ENCOUNTER — Ambulatory Visit: Payer: Commercial Managed Care - PPO | Attending: Internal Medicine | Admitting: Pharmacist

## 2020-02-18 ENCOUNTER — Encounter: Payer: Self-pay | Admitting: Pharmacist

## 2020-02-18 DIAGNOSIS — E1142 Type 2 diabetes mellitus with diabetic polyneuropathy: Secondary | ICD-10-CM

## 2020-02-18 DIAGNOSIS — IMO0002 Reserved for concepts with insufficient information to code with codable children: Secondary | ICD-10-CM

## 2020-02-18 DIAGNOSIS — E1165 Type 2 diabetes mellitus with hyperglycemia: Secondary | ICD-10-CM | POA: Diagnosis not present

## 2020-02-18 LAB — POCT GLYCOSYLATED HEMOGLOBIN (HGB A1C): Hemoglobin A1C: 8.6 % — AB (ref 4.0–5.6)

## 2020-02-18 MED ORDER — TRULICITY 1.5 MG/0.5ML ~~LOC~~ SOAJ: 1.5000 mg | SUBCUTANEOUS | 2 refills | Status: DC

## 2020-02-18 NOTE — Progress Notes (Signed)
    S:    PCP: Dr. Laural Benes  No chief complaint on file.  Patient arrives in good spirits. Presents for diabetes evaluation, education, and management. Patient was referred and last seen by PCP on 11/14/19 and last seen by the Clinical Pharmacist 01/19/2020. Medications were continued.   Dx with DM >15 yrs ago. Started as gestational.   Family/Social History: FHx: Mother - diabetes, hypertension Tobacco use: never smoker, denies smokeless tobacco Alcohol: denies   Insurance coverage/medication affordability: UMR/UHC PPO  Patient reports adherence with medications.  Current diabetes medications include:Trulicty 0.75 mg weekly, NPH Novolin 10 units AM and 21 units PM, Metformin XR 1000 mg BID  Current hyperlipidemia medications include: Pravastatin 20 mg - 1.5 tablets daily  Patient denies hypoglycemic events.  Patient reported dietary habits:  - trying to eat more salads and vegetables.  - switched to diet sodas  Patient-reported exercise habits:  - trying to walk 30 mins eveyday   Patient denies nocturia (nighttime urination).  Patient denies neuropathy (nerve pain). Patient denies visual changes. Patient reports self-foot exams.    O:  POCT: 116   Home fasting blood sugars: 130s-140s. Reports 2 outliers in the 200s.   Lab Results  Component Value Date   HGBA1C 8.6 (A) 02/18/2020   There were no vitals filed for this visit.  Lipid Panel     Component Value Date/Time   CHOL 184 11/25/2019 0918   TRIG 114 11/25/2019 0918   HDL 58 11/25/2019 0918   CHOLHDL 3.2 11/25/2019 0918   CHOLHDL 4.2 05/19/2013 1703   VLDL 38 05/19/2013 1703   LDLCALC 106 (H) 11/25/2019 0918    Clinical Atherosclerotic Cardiovascular Disease (ASCVD): No  The ASCVD Risk score Denman George DC Jr., et al., 2013) failed to calculate for the following reasons:   The 2013 ASCVD risk score is only valid for ages 15 to 36   A/P: Diabetes longstanding currently uncontrolled but improved. A1c down  today from 12.2 to 8.6. Per patient, home CBGs have improved drastically. Patient is able to verbalize appropriate hypoglycemia management plan. Patient is adherent with medications. Will increase Trulicity dose. -Increase Trulicity to 1.5 mg weekly. -Continue other medications.  -Extensively discussed pathophysiology of diabetes, recommended lifestyle interventions, dietary effects on blood sugar control -Counseled on s/sx of and management of hypoglycemia -A1c   Written patient instructions provided.  Total time in face to face counseling 20 minutes.    Follow up with PCP.   [name], PharmD, CPP Clinical Pharmacist [facility] & [facility] [phone]

## 2020-03-04 ENCOUNTER — Other Ambulatory Visit: Payer: Self-pay | Admitting: Internal Medicine

## 2020-03-22 ENCOUNTER — Ambulatory Visit: Payer: Commercial Managed Care - PPO | Admitting: Pharmacist

## 2020-03-24 ENCOUNTER — Ambulatory Visit: Payer: Commercial Managed Care - PPO | Admitting: Pharmacist

## 2020-03-26 ENCOUNTER — Other Ambulatory Visit: Payer: Self-pay

## 2020-03-26 ENCOUNTER — Ambulatory Visit: Payer: Commercial Managed Care - PPO | Attending: Internal Medicine | Admitting: Pharmacist

## 2020-03-26 DIAGNOSIS — E1142 Type 2 diabetes mellitus with diabetic polyneuropathy: Secondary | ICD-10-CM

## 2020-03-26 DIAGNOSIS — E785 Hyperlipidemia, unspecified: Secondary | ICD-10-CM | POA: Diagnosis not present

## 2020-03-26 DIAGNOSIS — E1165 Type 2 diabetes mellitus with hyperglycemia: Secondary | ICD-10-CM | POA: Diagnosis not present

## 2020-03-26 DIAGNOSIS — IMO0002 Reserved for concepts with insufficient information to code with codable children: Secondary | ICD-10-CM

## 2020-03-26 DIAGNOSIS — E1169 Type 2 diabetes mellitus with other specified complication: Secondary | ICD-10-CM | POA: Diagnosis not present

## 2020-03-26 MED ORDER — GLUCOSE BLOOD VI STRP: ORAL_STRIP | 6 refills | Status: DC

## 2020-03-26 MED ORDER — PRAVASTATIN SODIUM 20 MG PO TABS: ORAL_TABLET | ORAL | 2 refills | Status: DC

## 2020-03-26 MED ORDER — GABAPENTIN 300 MG PO CAPS: 300.0000 mg | ORAL_CAPSULE | Freq: Every day | ORAL | 3 refills | Status: DC

## 2020-03-26 NOTE — Progress Notes (Signed)
     S:    PCP: Dr. Laural Benes  No chief complaint on file.  Patient arrives in good spirits. Presents for diabetes evaluation, education, and management. Patient was referred and last seen by PCP on 11/14/19 and last seen by the Clinical Pharmacist 01/19/2020. Medications were continued.   Dx with DM >15 yrs ago. Started as gestational.   Family/Social History: FHx: Mother - diabetes, hypertension Tobacco use: never smoker, denies smokeless tobacco Alcohol: denies   Insurance coverage/medication affordability: UMR/UHC PPO  Patient reports adherence with medications.  Current diabetes medications include:Trulicty 1.5 mg weekly, NPH Novolin 10 units AM and 21 units PM, Metformin XR 1000 mg BID  Current hyperlipidemia medications include: Pravastatin 20 mg - 1.5 tablets daily  Patient denies hypoglycemic events.  Patient reported dietary habits:  - trying to eat more salads and vegetables.  - switched to diet sodas  Patient-reported exercise habits:  - trying to walk 30 mins eveyday   Patient denies nocturia (nighttime urination).  Patient denies neuropathy (nerve pain). Patient denies visual changes. Patient reports self-foot exams.    O:  POCT: 116   Home fasting blood sugars: 80 - 130. Denies any readings > 150.   Lab Results  Component Value Date   HGBA1C 8.6 (A) 02/18/2020   There were no vitals filed for this visit.  Lipid Panel     Component Value Date/Time   CHOL 184 11/25/2019 0918   TRIG 114 11/25/2019 0918   HDL 58 11/25/2019 0918   CHOLHDL 3.2 11/25/2019 0918   CHOLHDL 4.2 05/19/2013 1703   VLDL 38 05/19/2013 1703   LDLCALC 106 (H) 11/25/2019 0918    Clinical Atherosclerotic Cardiovascular Disease (ASCVD): No  The ASCVD Risk score Denman George DC Jr., et al., 2013) failed to calculate for the following reasons:   The 2013 ASCVD risk score is only valid for ages 24 to 48   A/P: Diabetes longstanding currently uncontrolled but home sugars are at goal per  patient. Patient is able to verbalize appropriate hypoglycemia management plan. Patient is adherent with medications.  -Continue current medications.  -Extensively discussed pathophysiology of diabetes, recommended lifestyle interventions, dietary effects on blood sugar control -Counseled on s/sx of and management of hypoglycemia  Written patient instructions provided.  Total time in face to face counseling 20 minutes.    Follow up with PCP in August.   [name], PharmD, CPP Clinical Pharmacist [facility] [phone]

## 2020-05-27 ENCOUNTER — Other Ambulatory Visit: Payer: Self-pay

## 2020-05-27 ENCOUNTER — Ambulatory Visit: Payer: Commercial Managed Care - PPO | Attending: Internal Medicine | Admitting: Family

## 2020-05-27 DIAGNOSIS — E1142 Type 2 diabetes mellitus with diabetic polyneuropathy: Secondary | ICD-10-CM | POA: Diagnosis not present

## 2020-05-27 DIAGNOSIS — Z789 Other specified health status: Secondary | ICD-10-CM

## 2020-05-27 DIAGNOSIS — IMO0002 Reserved for concepts with insufficient information to code with codable children: Secondary | ICD-10-CM

## 2020-05-27 DIAGNOSIS — E1165 Type 2 diabetes mellitus with hyperglycemia: Secondary | ICD-10-CM

## 2020-05-27 MED ORDER — GLUCOSE BLOOD VI STRP: ORAL_STRIP | 6 refills | Status: DC

## 2020-05-27 MED ORDER — METFORMIN HCL ER 500 MG PO TB24: ORAL_TABLET | ORAL | 2 refills | Status: DC

## 2020-05-27 MED ORDER — DAPAGLIFLOZIN PROPANEDIOL 5 MG PO TABS: 5.0000 mg | ORAL_TABLET | Freq: Every day | ORAL | 1 refills | Status: DC

## 2020-05-27 NOTE — Patient Instructions (Addendum)
Contine con la metformina y la insulina NPH segn lo prescrito. Mantenga Trulicity por ahora, pendiente de aprobacin previa. Brenda Greer. Laboratorio la semana que viene. Seguimiento en 1 mes con farmacutico clnico para control de diabetes. Seguimiento con el mdico de cabecera segn lo programado.  Continue Metformin and NPH insulin as prescribed. Hold Trulicity for now, pending prior approval. Adding [place]. Lab next week. Follow-up in 1 month with clinical pharmacist for diabetes check. Follow-up with primary physician as scheduled. Informacin bsica sobre la diabetes Diabetes Basics  La diabetes (diabetes mellitus) es una enfermedad de larga duracin (crnica). Se produce cuando el cuerpo no utiliza Brenda Greer (glucosa) que se libera de los alimentos despus de comer. La diabetes puede deberse a uno de Brenda Greer o a ambos:  El pncreas no produce suficiente cantidad de una hormona llamada insulina.  El cuerpo no reacciona de forma normal a la insulina que produce. La insulina permite que ciertos azcares (glucosa) ingresen a las clulas del cuerpo. Esto le proporciona energa. Si tiene diabetes, los azcares no pueden ingresar a las clulas. Esto produce un aumento del nivel de Brenda Greer (hiperglucemia). Sigue estas instrucciones en tu casa: Cmo se trata la diabetes? Es posible que tenga que administrarse insulina u otros medicamentos para la diabetes todos los [place] para mantener el nivel de International aid/development worker en la sangre equilibrado. Adminstrese los medicamentos para la diabetes todos los [place] se lo haya indicado el mdico. Haga una lista de los medicamentos para la diabetes aqu: Medicamentos para la diabetes  Nombre del medicamento: ______________________________ ? Cantidad (dosis): ________________ Brenda Greer (a.m./p.m.): _______________ Brenda Greer: ___________________________________  Brenda Greer medicamento: ______________________________ ? Cantidad  (dosis): ________________ Brenda Greer (a.m./p.m.): _______________ Brenda Greer: ___________________________________  Brenda Greer medicamento: ______________________________ ? Cantidad (dosis): ________________ Brenda Greer (a.m./p.m.): _______________ Brenda Greer: ___________________________________ Si [place] insulina, aprender cmo aplicrsela con inyecciones. Es posible que deba ajustar la cantidad en funcin de los alimentos que coma. Haga una lista de los tipos de [place] aqu: Insulina  Tipo de insulina: ______________________________ ? Cantidad (dosis): ________________ Brenda Greer (a.m./p.m.): _______________ Brenda Greer: ___________________________________  Levander Campion: ______________________________ ? Cantidad (dosis): ________________ Brenda Greer (a.m./p.m.): _______________ Brenda Greer: ___________________________________  Levander Campion: ______________________________ ? Cantidad (dosis): ________________ Brenda Greer (a.m./p.m.): _______________ Brenda Greer: ___________________________________  Levander Campion: ______________________________ ? Cantidad (dosis): ________________ Brenda Greer (a.m./p.m.): _______________ Brenda Greer: ___________________________________  Levander Campion: ______________________________ ? Cantidad (dosis): ________________ Brenda Greer (a.m./p.m.): _______________ Brenda Greer: ___________________________________ Cmo me controlo el nivel de azcar en la sangre?  Controle sus niveles de azcar en la sangre con un medidor de glucemia segn las indicaciones del mdico. El mdico fijar los objetivos del tratamiento para usted. Generalmente, los resultados de los niveles de azcar en la sangre deben ser los siguientes:  Antes de las comidas (preprandial): de 80 a 130mg /dl (de 4,4 a ).  Despus de las comidas (posprandial): por debajo de 180mg /dl (2,7MBEM/L).  Nivel de A1c: menos del 7%. Anote las veces que se controlar los niveles de azcar en la sangre: Controles de azcar en la sangre  Hora:  _______________ : ___________________________________  53GBEE/F: _______________ Notas: ___________________________________  Hora: _______________ Notas: ___________________________________  Hora: _______________ Notas: ___________________________________  Hora: _______________ Notas: ___________________________________  Hora: _______________ Notas: ___________________________________  Brenda Greer debo saber acerca del nivel bajo de azcar en la sangre? Un nivel bajo de azcar en la sangre se denomina hipoglucemia. Este cuadro ocurre cuando el nivel de Brenda Greer en la sangre es igual o menor que 70mg /dl (Ladell Heads). Entre los sntomas, se pueden incluir los siguientes:  Sentir: ?  Hambre. ? Preocupacin o nervios (ansiedad). ? Sudoracin y [employer] ? Confusin. ? Mareos. ? Somnolencia. ? Ganas de vomitar (nuseas).  Tener: ? Latidos cardacos acelerados. ? Dolor de [place]. ? Cambios en la visin. ? Hormigueo y falta de sensibilidad (entumecimiento) alrededor de la boca, los labios o la [place]. ? Movimientos espasmdicos que no puede controlar (convulsiones).  Dificultades para hacer lo siguiente: ? Moverse (coordinacin). ? Dormir. ? Desmayos. ? Molestarse con facilidad (irritabilidad). Tratamiento del nivel bajo de azcar en la sangre Para tratar un nivel bajo de azcar en la sangre, ingiera un alimento o una bebida azucarada de inmediato. Si puede pensar con claridad y tragar de manera segura, siga la regla 15/15, que consiste en lo siguiente:  Consuma 15gramos de un hidrato de carbono de accin rpida (carbohidrato). Hable con su mdico acerca de cunto debera consumir.  Algunos hidratos de carbono de accin rpida son: ? Comprimidos de azcar (pastillas de glucosa). Consuma 3o 4pastillas de glucosa. ? De 6 a 8unidades de caramelos duros. ? De 4 a 6onzas (de 120 a ) de jugo de frutas. ? De 4 a 6onzas (de 120 a ) de refresco comn (no diettico). ? 1  cucharada (68ml) de miel o azcar.  Contrlese el nivel de azcar en la sangre despus de ingerir el hidrato de carbono.  Si el nivel de azcar en la sangre todava es igual o menor que 70mg /dl ( ), ingiera nuevamente 15gramos de un hidrato de carbono.  Si el nivel de azcar en la sangre no supera los 70mg /dl (0,6CBJS/E) despus de 3intentos, solicite ayuda de inmediato.  Ingiera una comida o una colacin en el transcurso de 1hora despus de que el nivel de azcar en la sangre se haya normalizado. Tratamiento del nivel muy bajo de azcar en la sangre Si el nivel de azcar en la sangre es igual o menor que 54mg /dl (47mmol/l), significa que est muy bajo (hipoglucemia grave). Esto es 8,3TDVV/O. No espere a ver si los sntomas desaparecen. Solicite atencin mdica de inmediato. Comunquese con el servicio de emergencias de su localidad (911 en los [place]). No conduzca por sus propios medios hospital. Preguntas para hacerle al mdico  Es necesario que me rena con 1m en el cuidado de la diabetes?  Qu equipos necesitar para cuidarme en casa?  Qu medicamentos para la diabetes necesito? Cundo debo tomarlos?  Con qu frecuencia debo controlar mi nivel de azcar en la sangre?  A qu nmero puedo llamar si tengo preguntas?  Cundo es mi prxima cita con el mdico?  Dnde puedo encontrar un grupo de apoyo para las personas con diabetes? Dnde buscar ms informacin  American Diabetes Association (Asociacin Estadounidense de la Diabetes): www.diabetes.org  American Association of Diabetes Educators (Asociacin Estadounidense de Instructores para el Cuidado de la Diabetes): www.diabeteseducator.org/patient-resources Comunquese con un mdico si:  El nivel de azcar en la sangre es igual o mayor que 240mg /dl (Radio broadcast assistant) durante 2das seguidos.  Ha estado enfermo o ha tenido fiebre durante 2das o ms y no  mejora.  Tiene alguno de estos problemas durante ms de 6horas: ? No puede comer ni beber. ? Siente malestar estomacal (nuseas). ? Vomita. ? Presenta heces lquidas (diarrea). Solicite ayuda inmediatamente si:  El nivel de azcar en la sangre est por debajo de 54mg /dl (48mmol/l).  Se siente confundida.  Tiene dificultad para hacer lo siguiente: ? Pensar con claridad. ? La respiracin. Resumen  La diabetes (diabetes mellitus) es una enfermedad de IT trainer duracin (  crnica). Se produce cuando el cuerpo no utiliza Brenda Greer (glucosa) que se libera de los alimentos despus de la digestin.  Aplquese la insulina y tome los medicamentos para la diabetes como se lo hayan indicado.  Contrlese el nivel de azcar en la sangre todos los [place], con la frecuencia que le hayan indicado.  Concurra a todas las visitas de [address] se lo haya indicado el mdico. Esto es importante. Esta informacin no tiene Theme park manager el consejo del mdico. Asegrese de hacerle al mdico cualquier pregunta que tenga. Document Revised: 11/27/2018 Document Reviewed: 02/08/2018 Elsevier Patient Education  2020 [employer].

## 2020-05-27 NOTE — Progress Notes (Signed)
Virtual Visit via Telephone Note  I connected with Brenda Greer, on 05/27/2020 at 3:28 PM by telephone due to the COVID-19 pandemic and verified that I am speaking with the correct person using two identifiers.  Due to current restrictions/limitations of in-office visits due to the COVID-19 pandemic, this scheduled clinical appointment was converted to a telehealth visit.   Consent: I discussed the limitations, risks, security and privacy concerns of performing an evaluation and management service by telephone and the availability of in person appointments. I also discussed with the patient that there may be a patient responsible charge related to this service. The patient expressed understanding and agreed to proceed.  Location of Patient: Home  Location of Provider: [place]  Persons participating in Telemedicine visit: Brenda Downum Kayde Warehime [place], NP Orlan Leavens, [place]  History of Present Illness: Brenda Greer is a 34 year old female with history of uncontrolled type 2 diabetes mellitus with peripheral neuropathy, hyperlipidemia associated with type 2 diabetes mellitus, and iron deficiency anemia due to chronic blood loss who presents for diabetes follow-up.  1. DIABETES TYPE 2 FOLLOW-UP: Last A1C: 8.6% on 02/18/2020 Med Adherence:  _0  Yes    _1  No, reports she has not taken Trulicity in 1 month because says her insurance does not cover this and it would cost at least $600 out-of-pocket Medication side effects:  _2  Yes    _3  No  Home Monitoring?  _4  Yes    _5  No Home glucose results range: 82 - 172 in the  morning and  130 - 140 at night Diet Adherence: _6  Yes    _7  No Exercise: _8  Yes    _9  No Hypoglycemic episodes?: _10  Yes    _11  No Numbness of the feet? _12  Yes    _13  No Last eye exam: none Comments:  Last visit 11/14/2019 with Dr. Wynetta Emery. During that encounter diabetes uncontrolled. NPH insulin increased to 19  units.   Last visit 03/26/2020 with clinical pharmacist. During that encounter patient continued on regimen of Trulicity, NPH Novolin, and Metformin.   Past Medical History:  Diagnosis Date  . Diabetes mellitus without complication ([place])    No Known Allergies  Current Outpatient Medications on File Prior to Visit  Medication Sig Dispense Refill  . Blood Glucose Monitoring Suppl (ONETOUCH VERIO) w/Device KIT Use as instructed to check blood sugar three times daily. E11.42 1 kit 0  . Dulaglutide (TRULICITY) 1.5 GX/2.1JH SOPN Inject 1.5 mg into the skin once a week. 2 mL 2  . ferrous sulfate 325 (65 FE) MG EC tablet TAKE 1 TABLET BY MOUTH ONCE DAILY WITH BREAKFAST 100 tablet 1  . gabapentin (NEURONTIN) 300 MG capsule Take 1 capsule (300 mg total) by mouth at bedtime. 30 capsule 3  . glucose blood (ONETOUCH VERIO) test strip Use as instructed to check blood sugar 3 times daily. Dx code: E11.42 100 each 12  . glucose blood test strip Use as instructed to check blood sugar 3 times daily. Dx code: E11.42 100 each 6  . insulin NPH Human (NOVOLIN N) 100 UNIT/ML injection 10 units subcut in a.m and 18 units in p.m 10 mL 11  . Insulin Syringe-Needle U-100 (INSULIN SYRINGE 1CC/31GX5/16") 31G X 5/16" 1 ML MISC Use as directed 100 each 6  . metFORMIN (GLUCOPHAGE-XR) 500 MG 24 hr tablet TAKE 2 TABLETS BY MOUTH TWICE DAILY WITH A MEAL 120 tablet 2  . Multiple Vitamin (MULTIVITAMIN WITH MINERALS) TABS Take 1 tablet by mouth daily.    Marland Kitchen  OneTouch Delica Lancets 70J MISC Use as instructed to check blood sugar 3 times daily. Dx code: E11.42 100 each 6  . pantoprazole (PROTONIX) 20 MG tablet Take 1 tablet (20 mg total) by mouth daily. 30 tablet 2  . pravastatin (PRAVACHOL) 20 MG tablet Take 1 & 1/2 (one & one-half) tablets by mouth once daily. 45 tablet 2  . SUMAtriptan (IMITREX) 50 MG tablet take 1 tab at start of Headache.  May repeat in 2 hrs if no relief.  Max 2 tabs/24 hrs. 10 tablet 2  . topiramate  (TOPAMAX) 25 MG tablet Take 1 tablet (25 mg total) by mouth at bedtime. 30 tablet 1   No current facility-administered medications on file prior to visit.    Observations/Objective: Alert and oriented x 3. Not in acute distress. Physical examination not completed as this is a telemedicine visit.  Assessment and Plan: 1. Uncontrolled type 2 diabetes mellitus with peripheral neuropathy ([place]): - Last hemoglobin A1C not at goal at 8.6% on 02/18/2020, goal < 7%.  - Return within 1 week for repeat hemoglobin A1C to check level of diabetes control.  - Continue Metformin as prescribed.  - Continue Insulin NPH Human 10 units daily in the morning and 18 units daily at night. - Discontinue Dulaglutide pending approval of prior authorization.  - Adding Dapagliflozin Propanediol.  - Follow-up with clinical pharmacist in 4  weeks for diabetes checkup. Write your home blood sugar results down each day and bring those results to your appointment along with your home glucose monitor. Medication may be adjusted at that time if needed.  - To achieve an A1C goal of less than or equal to 7.0 percent, a fasting blood sugar of 80 to 130 mg/dL and a postprandial glucose (90 to 120 minutes after a meal) less than 180 mg/dL. In the event of sugars less than 60 mg/dl or greater than 400 mg/dl please notify the clinic ASAP. It is recommended that you undergo annual eye exams and annual foot exams. - Discussed the importance of healthy eating habits, low-carbohydrate diet, low-sugar diet, regular aerobic exercise (at least 150 minutes a week as tolerated) and medication compliance to achieve or maintain control of diabetes. - Follow-up with primary physician in 3 months or sooner if needed.  - dapagliflozin propanediol (FARXIGA) 5 MG TABS tablet; Take 1 tablet (5 mg total) by mouth daily.  Dispense: 30 tablet; Refill: 1 - metFORMIN (GLUCOPHAGE-XR) 500 MG 24 hr tablet; TAKE 2 TABLETS BY MOUTH TWICE DAILY WITH A MEAL  Dispense:  120 tablet; Refill: 2 - Hemoglobin A1c; Future - glucose blood test strip; Use as instructed to check blood sugar 3 times daily. Dx code: E11.42  Dispense: 100 each; Refill: 6  - Amb Referral to Clinical Pharmacist  2. Language barrier: Psychologist, forensic participated during this visit. Interpreter Name: Mardene Celeste, ID#: [id number].   Follow Up Instructions: Return to lab in 1 week for hemoglobin A1C. Follow-up with clinical pharmacist in 4 weeks for diabetes check-up. Follow-up with primary physician in 3 months or sooner if needed.    Patient was given clear instructions to go to Emergency Department or return to medical center if symptoms don't improve, worsen, or new problems develop.The patient verbalized understanding.  I discussed the assessment and treatment plan with the patient. The patient was provided an opportunity to ask questions and all were answered. The patient agreed with the plan and demonstrated an understanding of the instructions.   The patient was advised to call back  or seek an in-person evaluation if the symptoms worsen or if the condition fails to improve as anticipated.   I provided 43 minutes total of non-face-to-face time during this encounter including median intraservice time, reviewing previous notes, labs, imaging, medications, management and patient verbalized understanding.    Camillia Herter, NP  [facility] [place], [place]   05/27/2020, 3:28 PM

## 2020-05-28 ENCOUNTER — Telehealth: Payer: Self-pay | Admitting: Pharmacist

## 2020-05-28 NOTE — Telephone Encounter (Signed)
Is it possible to start a PA for this patient's Trulicity?

## 2020-06-01 NOTE — Telephone Encounter (Signed)
According to insurance-drug is covered under plan, no PA needed at this time.

## 2020-06-30 ENCOUNTER — Other Ambulatory Visit: Payer: Self-pay | Admitting: Internal Medicine

## 2020-06-30 DIAGNOSIS — IMO0002 Reserved for concepts with insufficient information to code with codable children: Secondary | ICD-10-CM

## 2020-07-06 ENCOUNTER — Other Ambulatory Visit: Payer: Self-pay

## 2020-07-06 ENCOUNTER — Ambulatory Visit: Payer: Commercial Managed Care - PPO | Attending: Internal Medicine

## 2020-07-06 DIAGNOSIS — IMO0002 Reserved for concepts with insufficient information to code with codable children: Secondary | ICD-10-CM

## 2020-07-07 LAB — HEMOGLOBIN A1C
Est. average glucose Bld gHb Est-mCnc: 258 mg/dL
Hgb A1c MFr Bld: 10.6 % — ABNORMAL HIGH (ref 4.8–5.6)

## 2020-07-08 NOTE — Progress Notes (Signed)
Keep diabetes check-up appointment scheduled for 07/12/2020 at 3:30 pm with clinical pharmacist as discussed during last visit.

## 2020-07-11 NOTE — Progress Notes (Signed)
   S:    PCP: Dr. Laural Benes  Patient arrives in good spirits. Presents for diabetes evaluation, education, and management. Patient was referred and last seen by Amy on 05/27/20 at which time patient reported not taking Trulicity for >1 month due to insurance not covering. However, when looking into prior authorization, drug is covered under insurance plan. Marcelline Deist was initiated at that visit.   Today, patient reports she has not taken any medications or checked her blood sugars in over 1 month because she ran out and says she did not have any refills. She never started [place].   Dx with DM >15 yrs ago. Started as gestational.   Family/Social History: FHx: Mother - diabetes, hypertension Tobacco use: never smoker, denies smokeless tobacco Alcohol: denies   [employer] affordability: [employer] PPO  Current diabetes medications include: Trulicty 1.5 mg weekly, NPH Novolin 10 units AM and 18 units PM, Metformin XR 1000 mg BID, Farxiga 5 mg daily -Has not taken any medications in >1 month. Never picked up first fill of Farxiga.  Current hyperlipidemia medications include: Pravastatin 20 mg - 1.5 tablets daily  Patient denies hypoglycemic events.  Patient reported dietary habits:  - Continuing to eat more salads and vegetables.  - Continuing to drink diet sodas rather than regular  Patient-reported exercise habits:  - Walks for 1 hour 4x/week   Patient denies nocturia (nighttime urination).  Patient denies neuropathy (nerve pain). Patient denies visual changes. Patient reports self-foot exams.     O:  POCT: 347 (4 h post-prandial, ate shrimp with salad and a little rice)  Home fasting blood sugars: has not been checking at home since she ran out of test strips >1 month ago  Lab Results  Component Value Date   HGBA1C 10.6 (H) 07/06/2020   There were no vitals filed for this visit.  Lipid Panel     Component Value Date/Time   CHOL 184 11/25/2019 0918   TRIG  114 11/25/2019 0918   HDL 58 11/25/2019 0918   CHOLHDL 3.2 11/25/2019 0918   CHOLHDL 4.2 05/19/2013 1703   VLDL 38 05/19/2013 1703   LDLCALC 106 (H) 11/25/2019 0918   Clinical Atherosclerotic Cardiovascular Disease (ASCVD): No  The ASCVD Risk score Denman George DC Jr., et al., 2013) failed to calculate for the following reasons:   The 2013 ASCVD risk score is only valid for ages 82 to 66   A/P:  Diabetes longstanding currently uncontrolled due to noncompliance with all medications for >1 month. Patient is able to verbalize appropriate hypoglycemia management plan.  -Refills for all diabetes medications and supplies sent in to [facility] on [facility] -Restart insulin, metformin, and Trulicity -Restart checking blood sugars daily at home -May consider starting Farxiga at follow up pending blood sugars. At this time, do not want to precipitate yeast infection or UTI by glucose spilling into the urine with blood sugars as elevated as they are right now. -Discussed how to refill medications at [facility] for the future.  -Extensively discussed pathophysiology of diabetes, recommended lifestyle interventions, dietary effects on blood sugar control -Counseled on s/sx and management of hypoglycemia -Next A1c due 10/05/20  Written patient instructions provided. Total time in face to face counseling 20 minutes.    Follow up with pharmacy clinic in 1 week.   Pervis Hocking, PharmD PGY1 Pharmacy Resident 07/12/2020 3:42 PM

## 2020-07-12 ENCOUNTER — Other Ambulatory Visit: Payer: Self-pay

## 2020-07-12 ENCOUNTER — Other Ambulatory Visit: Payer: Self-pay | Admitting: Pharmacist

## 2020-07-12 ENCOUNTER — Encounter: Payer: Self-pay | Admitting: Pharmacist

## 2020-07-12 ENCOUNTER — Ambulatory Visit: Payer: Commercial Managed Care - PPO | Attending: Internal Medicine | Admitting: Pharmacist

## 2020-07-12 DIAGNOSIS — IMO0002 Reserved for concepts with insufficient information to code with codable children: Secondary | ICD-10-CM

## 2020-07-12 DIAGNOSIS — E1142 Type 2 diabetes mellitus with diabetic polyneuropathy: Secondary | ICD-10-CM | POA: Diagnosis not present

## 2020-07-12 DIAGNOSIS — E1165 Type 2 diabetes mellitus with hyperglycemia: Secondary | ICD-10-CM | POA: Diagnosis not present

## 2020-07-12 LAB — GLUCOSE, POCT (MANUAL RESULT ENTRY): POC Glucose: 347 mg/dl — AB (ref 70–99)

## 2020-07-12 MED ORDER — METFORMIN HCL ER 500 MG PO TB24: ORAL_TABLET | ORAL | 1 refills | Status: DC

## 2020-07-12 MED ORDER — ONETOUCH VERIO VI STRP: ORAL_STRIP | 12 refills | Status: DC

## 2020-07-12 MED ORDER — TRULICITY 1.5 MG/0.5ML ~~LOC~~ SOAJ: 1.5000 mg | SUBCUTANEOUS | 2 refills | Status: DC

## 2020-07-12 NOTE — Patient Instructions (Signed)
Gracias por venir a verme hoy. Por favor haga lo siguiente:  1. Empiece a tomar su insulina nuevamente. Tomar 10 unidades por la Diannia Ruder y 18 unidades antes de [place]. 2. Comience a tomar metformina nuevamente. Tomar 2 comprimidos por la maana y 2 comprimidos por la noche. 3. Si no es demasiado caro, Associate Professor. Tmelo una vez a la semana. Si no puede pagar esto, no se preocupe. Podemos discutirlo en su cita de seguimiento conmigo. 4. Empiece a controlar Radiographer, therapeutic. 5. Contine haciendo los cambios de estilo de vida que discutimos juntos durante nuestra visita. La dieta y el ejercicio juegan un papel importante en la mejora de los niveles de Production assistant, radio. 6. Haz un seguimiento conmigo en 1 semana. 7. A partir de ahora, llame a [facility] y solicite reabastecimientos cuando se le [employer] Su nmero es: [phone].     English:  Thank you for coming to see me today. Please do the following:  1. Start taking your insulin again. Take 10 units in the morning and 18 units at bedtime.  2. Start taking metformin again. Take 2 tablets in the morning and 2 tablets in the evening.  3. If not too expensive, begin the Trulicity. Take it once a week. If you cannot afford this, don't wotry. We can discuss it at your follow-up appointment with me.  4. Start checking blood sugars at home. 5. Continue making the lifestyle changes we've discussed together during our visit. Diet and exercise play a significant role in improving your blood sugars.  6. Follow-up with me in 1 week.  7. From now on, please call [facility] and request refills when you run out of medications. Their number is: [phone].

## 2020-07-19 ENCOUNTER — Encounter: Payer: Self-pay | Admitting: Pharmacist

## 2020-07-19 ENCOUNTER — Other Ambulatory Visit: Payer: Self-pay | Admitting: Internal Medicine

## 2020-07-19 ENCOUNTER — Ambulatory Visit: Payer: Commercial Managed Care - PPO | Attending: Internal Medicine | Admitting: Pharmacist

## 2020-07-19 ENCOUNTER — Other Ambulatory Visit: Payer: Self-pay

## 2020-07-19 DIAGNOSIS — E1142 Type 2 diabetes mellitus with diabetic polyneuropathy: Secondary | ICD-10-CM

## 2020-07-19 DIAGNOSIS — IMO0002 Reserved for concepts with insufficient information to code with codable children: Secondary | ICD-10-CM

## 2020-07-19 DIAGNOSIS — E1165 Type 2 diabetes mellitus with hyperglycemia: Secondary | ICD-10-CM

## 2020-07-19 LAB — GLUCOSE, POCT (MANUAL RESULT ENTRY): POC Glucose: 257 mg/dl — AB (ref 70–99)

## 2020-07-19 MED ORDER — GLUCOSE BLOOD VI STRP: ORAL_STRIP | 6 refills | Status: DC

## 2020-07-19 MED ORDER — ONETOUCH VERIO W/DEVICE KIT: PACK | 0 refills | Status: DC

## 2020-07-19 MED ORDER — ONETOUCH DELICA LANCETS 33G MISC: 6 refills | Status: DC

## 2020-07-19 MED ORDER — ONETOUCH VERIO VI STRP: ORAL_STRIP | 12 refills | Status: DC

## 2020-07-19 MED FILL — ONETOUCH VERIO REFLECT W/DE: W/DEVICE | 1 days supply | Qty: 1 | Fill #0

## 2020-07-19 NOTE — Progress Notes (Signed)
   S:    PCP: Dr. Laural Benes  Patient arrives in good spirits. Presents for diabetes evaluation, education, and management. Patient was referred and last seen by Amy on 05/27/20. We restarted medications when we saw her on 07/12/20. Today, she reports she is not taking Trulicity as she cannot afford the copay. She reports compliance with metformin and prescribed doses of insulin.    Dx with DM >15 yrs ago. Started as gestational.   Family/Social History: FHx: Mother - diabetes, hypertension Tobacco use: never smoker, denies smokeless tobacco Alcohol: denies   [employer]chant [employer] affordability: UMR/[employer] PPO  Current diabetes medications include: NPH Novolin 10 units AM and 18 units PM, Metformin XR 1000 mg BID  Patient denies hypoglycemic events.  Patient reported dietary habits:  - Continuing to eat more salads and vegetables.  - Continuing to drink diet sodas rather than regular  Patient-reported exercise habits:  - Walks for 1 hour 4x/week   Patient denies nocturia (nighttime urination).  Patient denies neuropathy (nerve pain). Patient denies visual changes. Patient reports self-foot exams.     O:  POCT: 257   Home fasting blood sugars: still not checking   Lab Results  Component Value Date   HGBA1C 10.6 (H) 07/06/2020   There were no vitals filed for this visit.  Lipid Panel     Component Value Date/Time   CHOL 184 11/25/2019 0918   TRIG 114 11/25/2019 0918   HDL 58 11/25/2019 0918   CHOLHDL 3.2 11/25/2019 0918   CHOLHDL 4.2 05/19/2013 1703   VLDL 38 05/19/2013 1703   LDLCALC 106 (H) 11/25/2019 0918   Clinical Atherosclerotic Cardiovascular Disease (ASCVD): No  The ASCVD Risk score Denman George DC Jr., et al., 2013) failed to calculate for the following reasons:   The 2013 ASCVD risk score is only valid for ages 21 to 42   A/P:  Diabetes longstanding currently uncontrolled due to noncompliance with all medications for >1 month. Patient is able to verbalize  appropriate hypoglycemia management plan.  -Stop Trulicity; cannot afford -Continue insulin and metformin at current doses.  -Rx for testing supplies sent to our pharmacy for pick-up. -Extensively discussed pathophysiology of diabetes, recommended lifestyle interventions, dietary effects on blood sugar control -Counseled on s/sx and management of hypoglycemia -Next A1c due 10/05/20  Written patient instructions provided. Total time in face to face counseling 20 minutes.    Follow up with pharmacy clinic in 1 week.   [name], PharmD, CPP Clinical Pharmacist [facility] [phone]

## 2020-07-26 ENCOUNTER — Encounter: Payer: Self-pay | Admitting: Pharmacist

## 2020-07-26 ENCOUNTER — Other Ambulatory Visit: Payer: Self-pay

## 2020-07-26 ENCOUNTER — Ambulatory Visit: Payer: Commercial Managed Care - PPO | Attending: Internal Medicine | Admitting: Pharmacist

## 2020-07-26 DIAGNOSIS — E1169 Type 2 diabetes mellitus with other specified complication: Secondary | ICD-10-CM

## 2020-07-26 DIAGNOSIS — E1142 Type 2 diabetes mellitus with diabetic polyneuropathy: Secondary | ICD-10-CM

## 2020-07-26 DIAGNOSIS — E785 Hyperlipidemia, unspecified: Secondary | ICD-10-CM

## 2020-07-26 DIAGNOSIS — IMO0002 Reserved for concepts with insufficient information to code with codable children: Secondary | ICD-10-CM

## 2020-07-26 DIAGNOSIS — E1165 Type 2 diabetes mellitus with hyperglycemia: Secondary | ICD-10-CM

## 2020-07-26 MED ORDER — INSULIN NPH (HUMAN) (ISOPHANE) 100 UNIT/ML ~~LOC~~ SUSP: 16.0000 [IU] | Freq: Two times a day (BID) | SUBCUTANEOUS | 11 refills | Status: DC

## 2020-07-26 MED ORDER — PRAVASTATIN SODIUM 20 MG PO TABS: ORAL_TABLET | ORAL | 2 refills | Status: DC

## 2020-07-26 NOTE — Progress Notes (Signed)
S:    Virtual Visit via Telephone Note Due to national recommendations of social distancing due to COVID 19, telehealth visit is felt to be most appropriate for this patient at this time. I discussed the limitations, risks, security and privacy concerns of performing an evaluation and management service by telephone and the availability of in person appointments. I also discussed with the patient that there may be a patient responsible charge related to this service. The patient expressed understanding and agreed to proceed.   I connected with Brenda Greer on 07/26/2020 @ 3:30 PM EDTEDT by telephoneand verified that I am speaking with the correct person using two identifiers.  Consent I discussed the limitations, risks, security and privacy concerns of performing an evaluation and management service by telephone and the availability of in person appointments. I also discussed with the patient that there may be a patient responsible charge related to this service. The patient expressed understanding and agreed to proceed.  Location of Patient: Private[facility]idence  [facility] Provider[facility]   Persons participating in Telemedicine visit: [name], PharmD, CPP Patient   PCP: Dr. Laural Benes  Patient in good spirits. Presents for diabetes evaluation, education, and management. Patient was referred and last seen by Amy on 05/27/20.   Dx with DM >15 yrs ago. Started as gestational.   Family/Social History: FHx: Mother - diabetes, hypertension Tobacco use: never smoker, denies smokeless tobacco Alcohol: denies   [employer] affordability: UMR/U[employer] PPO  Current diabetes medications include: - NPH Novolin 10 units AM and 18 units PM - Metformin XR 1000 mg BID  Patient denies hypoglycemic events.  Patient reported dietary habits:  - Continuing to eat more salads and vegetables.  - Continuing to drink  diet sodas rather than regular  Patient-reported exercise habits:  - Walks for 1 hour 4x/week   Patient denies nocturia (nighttime urination).  Patient denies neuropathy (nerve pain). Patient denies visual changes. Patient reports self-foot exams.     O:  Sugar today: 140  Home fasting blood sugars:  10/5: 186 10/6: 186 10/7: 156 10/8: 200 10/9: 190 10/10: 186  Lab Results  Component Value Date   HGBA1C 10.6 (H) 07/06/2020   There were no vitals filed for this visit.  Lipid Panel     Component Value Date/Time   CHOL 184 11/25/2019 0918   TRIG 114 11/25/2019 0918   HDL 58 11/25/2019 0918   CHOLHDL 3.2 11/25/2019 0918   CHOLHDL 4.2 05/19/2013 1703   VLDL 38 05/19/2013 1703   LDLCALC 106 (H) 11/25/2019 0918   Clinical Atherosclerotic Cardiovascular Disease (ASCVD): No  The ASCVD Risk score Denman George DC Jr., et al., 2013) failed to calculate for the following reasons:   The 2013 ASCVD risk score is only valid for ages 34 to 93   A/P:  Diabetes longstanding currently uncontrolled. Home sugars are above goal. Patient is able to verbalize appropriate hypoglycemia management plan. Will increase insulin at this time and f/u in 1 month.  -Increase Novolin to 16 units BID -Continue metformin 500 mg XR; take 2 tablets BID (1000 mg BID) -Extensively discussed pathophysiology of diabetes, recommended lifestyle interventions, dietary effects on blood sugar control -Counseled on s/sx and management of hypoglycemia -Next A1c due 10/05/20  Written patient instructions provided. Total time in face to face counseling 20 minutes.    Follow up with pharmacy clinic in 1 month.   [name], PharmD, CPP Clinical Pharmacist [facility] [phone]

## 2020-08-24 ENCOUNTER — Ambulatory Visit: Payer: Commercial Managed Care - PPO | Admitting: Pharmacist

## 2020-08-30 ENCOUNTER — Other Ambulatory Visit: Payer: Self-pay

## 2020-08-30 ENCOUNTER — Ambulatory Visit: Payer: Commercial Managed Care - PPO | Attending: Internal Medicine | Admitting: Pharmacist

## 2020-08-30 DIAGNOSIS — E1142 Type 2 diabetes mellitus with diabetic polyneuropathy: Secondary | ICD-10-CM

## 2020-08-30 DIAGNOSIS — E1165 Type 2 diabetes mellitus with hyperglycemia: Secondary | ICD-10-CM | POA: Diagnosis not present

## 2020-08-30 DIAGNOSIS — IMO0002 Reserved for concepts with insufficient information to code with codable children: Secondary | ICD-10-CM

## 2020-08-30 LAB — GLUCOSE, POCT (MANUAL RESULT ENTRY): POC Glucose: 228 mg/dl — AB (ref 70–99)

## 2020-08-30 NOTE — Progress Notes (Signed)
   S:     PCP: Dr. Laural Benes  Patient in good spirits. Presents for diabetes evaluation, education, and management. Patient was referred and last seen by Amy on 05/27/20.   Dx with DM >15 yrs ago. Started as gestational.   Family/Social History: FHx: Mother - diabetes, hypertension Tobacco use: never smoker, denies smokeless tobacco Alcohol: denies   [employer]chant [employer] affordability: UMR/[employer] PPO  Current diabetes medications include: - NPH Novolin 16 units AM and 16 units PM - Metformin XR 1000 mg BID  Patient denies hypoglycemic events.  Patient reported dietary habits:  - Continuing to eat more salads and vegetables.  - Continuing to drink diet sodas rather than regular  Patient-reported exercise habits:  - Walks for 1 hour 4x/week   Patient denies nocturia (nighttime urination).  Patient denies neuropathy (nerve pain). Patient denies visual changes. Patient reports self-foot exams.     O:  Sugar today: 228  Home fasting blood sugars:  No meter with her  Reports fasting ranges 80s - 200s; reports that it is mostly in the higher levels  Lab Results  Component Value Date   HGBA1C 10.6 (H) 07/06/2020   There were no vitals filed for this visit.  Lipid Panel     Component Value Date/Time   CHOL 184 11/25/2019 0918   TRIG 114 11/25/2019 0918   HDL 58 11/25/2019 0918   CHOLHDL 3.2 11/25/2019 0918   CHOLHDL 4.2 05/19/2013 1703   VLDL 38 05/19/2013 1703   LDLCALC 106 (H) 11/25/2019 0918   Clinical Atherosclerotic Cardiovascular Disease (ASCVD): No  The ASCVD Risk score Denman George DC Jr., et al., 2013) failed to calculate for the following reasons:   The 2013 ASCVD risk score is only valid for ages 17 to 55   A/P:  Diabetes longstanding currently uncontrolled. Home sugars are above goal. Patient is able to verbalize appropriate hypoglycemia management plan. Will increase insulin at this time.  -Increase Novolin to 20 units in the morning and 20 units in  the evening.  -Continue metformin 500 mg XR; take 2 tablets BID (1000 mg BID) -Extensively discussed pathophysiology of diabetes, recommended lifestyle interventions, dietary effects on blood sugar control -Counseled on s/sx and management of hypoglycemia -Next A1c due 10/05/20  Written patient instructions provided. Total time in face to face counseling 20 minutes.    Follow up with PCP in December.   [name], PharmD, CPP Clinical Pharmacist [facility] [phone]

## 2020-09-21 ENCOUNTER — Other Ambulatory Visit (HOSPITAL_COMMUNITY)
Admission: RE | Admit: 2020-09-21 | Discharge: 2020-09-21 | Disposition: A | Discharge status: Home / Self Care | Payer: Commercial Managed Care - PPO | Source: Ambulatory Visit | Attending: Internal Medicine | Admitting: Internal Medicine

## 2020-09-21 ENCOUNTER — Encounter: Payer: Self-pay | Admitting: Internal Medicine

## 2020-09-21 ENCOUNTER — Ambulatory Visit: Payer: Commercial Managed Care - PPO | Attending: Internal Medicine | Admitting: Internal Medicine

## 2020-09-21 ENCOUNTER — Other Ambulatory Visit: Payer: Self-pay

## 2020-09-21 VITALS — BP 117/77 | HR 101 | Temp 97.7°F | Resp 16 | Ht 60.0 in | Wt 157.6 lb

## 2020-09-21 DIAGNOSIS — E1142 Type 2 diabetes mellitus with diabetic polyneuropathy: Secondary | ICD-10-CM

## 2020-09-21 DIAGNOSIS — Z0000 Encounter for general adult medical examination without abnormal findings: Secondary | ICD-10-CM

## 2020-09-21 DIAGNOSIS — E1169 Type 2 diabetes mellitus with other specified complication: Secondary | ICD-10-CM | POA: Diagnosis not present

## 2020-09-21 DIAGNOSIS — E785 Hyperlipidemia, unspecified: Secondary | ICD-10-CM

## 2020-09-21 DIAGNOSIS — E669 Obesity, unspecified: Secondary | ICD-10-CM | POA: Diagnosis not present

## 2020-09-21 DIAGNOSIS — Z124 Encounter for screening for malignant neoplasm of cervix: Secondary | ICD-10-CM | POA: Diagnosis not present

## 2020-09-21 DIAGNOSIS — E1165 Type 2 diabetes mellitus with hyperglycemia: Secondary | ICD-10-CM

## 2020-09-21 DIAGNOSIS — D500 Iron deficiency anemia secondary to blood loss (chronic): Secondary | ICD-10-CM | POA: Diagnosis not present

## 2020-09-21 DIAGNOSIS — Z9229 Personal history of other drug therapy: Secondary | ICD-10-CM

## 2020-09-21 LAB — POCT GLYCOSYLATED HEMOGLOBIN (HGB A1C): HbA1c, POC (controlled diabetic range): 10.3 % — AB (ref 0.0–7.0)

## 2020-09-21 LAB — GLUCOSE, POCT (MANUAL RESULT ENTRY): POC Glucose: 259 mg/dl — AB (ref 70–99)

## 2020-09-21 MED ORDER — INSULIN NPH (HUMAN) (ISOPHANE) 100 UNIT/ML ~~LOC~~ SUSP: 24.0000 [IU] | Freq: Two times a day (BID) | SUBCUTANEOUS | 11 refills | Status: DC

## 2020-09-21 NOTE — Progress Notes (Signed)
Patient ID: Brenda Greer, female    DOB: 1986-06-09  MRN: [id number]  CC: Gynecologic Exam, Annual Exam, and Diabetes   Subjective: Brenda Greer is a 34 y.o. female who presents for annual exam Her concerns today include:  Pt has DM, HL, GERD, Iron def anemia, migraines without aura.   Last seen by me 11 mths ago.  Had televisit with NP in 05/2020.  She has seen the clinical pharmacist several times to help with DM management.  DIABETES TYPE 2 Last A1C:   Results for orders placed or performed in visit on 09/21/20  POCT glucose (manual entry)  Result Value Ref Range   POC Glucose 259 (A) 70 - 99 mg/dl  POCT glycosylated hemoglobin (Hb A1C)  Result Value Ref Range   Hemoglobin A1C     HbA1c POC (<> result, manual entry)     HbA1c, POC (prediabetic range)     HbA1c, POC (controlled diabetic range) 10.3 (A) 0.0 - 7.0 %    Med Adherence:  [x]  Yes   Metformin 1 gram BID and insulin 20 units BID Medication side effects:  []  Yes    [x]  No Home Monitoring?  [x]  Yes   In mornings Home glucose results range: 83-253 Diet Adherence: [x]  Yes    []  No Exercise: [x]  Yes - walking 30 mins 3x/wk Hypoglycemic episodes?: []  Yes    [x]  No Numbness of the feet? []  Yes    [x]  No Retinopathy hx? []  Yes    []  No Last eye exam:  Due for eye exam.  Comments:   HL:  Reports compliance with and tolerating Pravachol  Anemia:  Taking iron daily  PAP:  Reports regular cycles lasting 3-4 days No vaginal dischg Sexually active with her spouse only Reports regular menses. Last menstrual cycle was about the middle of last month. No fhx of ovarian or breast CA  HM:  Declines flu and tdapt.  Completed COVID vaccine series.   Patient Active Problem List   Diagnosis Date Noted  . Uncontrolled type 2 diabetes mellitus with peripheral neuropathy ([place]) 07/18/2019  . Migraine without aura and without status migrainosus, not intractable 07/18/2019  . Hyperlipidemia associated  with type 2 diabetes mellitus ([place]) 07/18/2019  . Iron deficiency anemia due to chronic blood loss 07/02/2018  . Hyperlipidemia 02/26/2018  . Gastroesophageal reflux disease without esophagitis 02/26/2018  . Diabetes mellitus without complication ([place]) 40/98/1191  . Type II or unspecified type diabetes mellitus without mention of complication, uncontrolled 05/19/2013  . OBESITY, NOS 12/13/2006     Current Outpatient Medications on File Prior to Visit  Medication Sig Dispense Refill  . Blood Glucose Monitoring Suppl (ONETOUCH VERIO) w/Device KIT Use as instructed to check blood sugar three times daily. E11.42 1 kit 0  . ferrous sulfate 325 (65 FE) MG EC tablet TAKE 1 TABLET BY MOUTH ONCE DAILY WITH BREAKFAST 100 tablet 1  . gabapentin (NEURONTIN) 300 MG capsule Take 1 capsule (300 mg total) by mouth at bedtime. 30 capsule 3  . glucose blood (ONETOUCH VERIO) test strip Use as instructed to check blood sugar 3 times daily. Dx code: E11.42 100 each 12  . glucose blood test strip Use as instructed to check sugars TID 100 each 6  . Insulin Syringe-Needle U-100 (INSULIN SYRINGE 1CC/31GX5/16") 31G X 5/16" 1 ML MISC Use as directed 100 each 6  . metFORMIN (GLUCOPHAGE-XR) 500 MG 24 hr tablet TAKE 2 TABLETS BY MOUTH TWICE DAILY WITH A MEAL 360 tablet 1  .  Multiple Vitamin (MULTIVITAMIN WITH MINERALS) TABS Take 1 tablet by mouth daily.    Brenda Greer Lancets 25K MISC Use as instructed to check blood sugar 3 times daily. Dx code: E11.42 100 each 6  . pantoprazole (PROTONIX) 20 MG tablet Take 1 tablet (20 mg total) by mouth daily. 30 tablet 2  . pravastatin (PRAVACHOL) 20 MG tablet Take 1 & 1/2 (one & one-half) tablets by mouth once daily. 45 tablet 2  . SUMAtriptan (IMITREX) 50 MG tablet take 1 tab at start of Headache.  May repeat in 2 hrs if no relief.  Max 2 tabs/24 hrs. 10 tablet 2  . topiramate (TOPAMAX) 25 MG tablet Take 1 tablet (25 mg total) by mouth at bedtime. 30 tablet 1   No current  facility-administered medications on file prior to visit.    No Known Allergies  Social History   Socioeconomic History  . Marital status: Single    Spouse name: Not on file  . Number of children: Not on file  . Years of education: Not on file  . Highest education level: Not on file  Occupational History  . Not on file  Tobacco Use  . Smoking status: Never Smoker  . Smokeless tobacco: Never Used  Substance and Sexual Activity  . Alcohol use: No  . Drug use: No  . Sexual activity: Not on file  Other Topics Concern  . Not on file  Social History Narrative  . Not on file   Social Determinants of Health   Financial Resource Strain:   . Difficulty of Paying Living Expenses: Not on file  Food Insecurity:   . Worried About Charity fundraiser in the Last Year: Not on file  . Ran Out of Food in the Last Year: Not on file  Transportation Needs:   . Lack of Transportation (Medical): Not on file  . Lack of Transportation (Non-Medical): Not on file  Physical Activity:   . Days of Exercise per Week: Not on file  . Minutes of Exercise per Session: Not on file  Stress:   . Feeling of Stress : Not on file  Social Connections:   . Frequency of Communication with Friends and Family: Not on file  . Frequency of Social Gatherings with Friends and Family: Not on file  . Attends Religious Services: Not on file  . Active Member of Clubs or Organizations: Not on file  . Attends Archivist Meetings: Not on file  . Marital Status: Not on file  Intimate Partner Violence:   . Fear of Current or Ex-Partner: Not on file  . Emotionally Abused: Not on file  . Physically Abused: Not on file  . Sexually Abused: Not on file    Family History  Problem Relation Age of Onset  . Diabetes Mother   . Hypertension Mother     No past surgical history on file.  ROS: Review of Systems Negative except as stated above  PHYSICAL EXAM: BP 117/77   Pulse (!) 101   Temp 97.7 F (36.5  C)   Resp 16   Ht 5' (1.524 m)   Wt 157 lb 9.6 oz (71.5 kg)   SpO2 99%   BMI 30.78 kg/m   Physical Exam  General appearance - alert, well appearing, young to middle-aged Hispanic female and in no distress Mental status - normal mood, behavior, speech, dress, motor activity, and thought processes Eyes - pupils equal and reactive, extraocular eye movements intact Nose - normal and  patent, no erythema, discharge or polyps Mouth - mucous membranes moist, pharynx normal without lesions Neck - supple, no significant adenopathy Lymphatics - no palpable lymphadenopathy, no hepatosplenomegaly Chest - clear to auscultation, no wheezes, rales or rhonchi, symmetric air entry Heart - normal rate, regular rhythm, normal S1, S2, no murmurs, rubs, clicks or gallops Abdomen - soft, nontender, nondistended, no masses or organomegaly Breasts -CMA Suezanne Jacquet Y present for breast and pelvic exam breasts appear normal, no suspicious masses, no skin or nipple changes or axillary nodes Pelvic - normal external genitalia, vulva, vagina, cervix, uterus and adnexa Extremities - peripheral pulses normal, no pedal edema, no clubbing or cyanosis Diabetic Foot Exam - Simple   Simple Foot Form Visual Inspection See comments: Yes Sensation Testing Intact to touch and monofilament testing bilaterally: Yes Pulse Check Posterior Tibialis and Dorsalis pulse intact bilaterally: Yes Comments Toenails are thickened discolored but not overgrown.     CMP Latest Ref Rng & Units 11/25/2019 11/07/2018 09/10/2017  Glucose 65 - 99 mg/dL 266(H) 277(H) 171(H)  BUN 6 - 20 mg/dL $Remove'8 8 7  'xbQmsXS$ Creatinine 0.57 - 1.00 mg/dL 0.47(L) 0.49(L) 0.50(L)  Sodium 134 - 144 mmol/L 138 137 140  Potassium 3.5 - 5.2 mmol/L 3.9 4.4 4.1  Chloride 96 - 106 mmol/L 103 102 104  CO2 20 - 29 mmol/L 19(L) 20 20  Calcium 8.7 - 10.2 mg/dL 9.0 9.6 9.1  Total Protein 6.0 - 8.5 g/dL 6.7 7.3 7.2  Total Bilirubin 0.0 - 1.2 mg/dL 0.3 0.4 <0.2  Alkaline Phos 39 - 117  IU/L 82 72 64  AST 0 - 40 IU/L 41(H) 34 21  ALT 0 - 32 IU/L 43(H) 29 16   Lipid Panel     Component Value Date/Time   CHOL 184 11/25/2019 0918   TRIG 114 11/25/2019 0918   HDL 58 11/25/2019 0918   CHOLHDL 3.2 11/25/2019 0918   CHOLHDL 4.2 05/19/2013 1703   VLDL 38 05/19/2013 1703   LDLCALC 106 (H) 11/25/2019 0918    CBC    Component Value Date/Time   WBC 7.6 11/25/2019 0918   WBC 9.4 04/06/2013 2035   RBC 4.87 11/25/2019 0918   RBC 4.10 04/06/2013 2035   HGB 10.4 (L) 11/25/2019 0918   HCT 35.7 11/25/2019 0918   PLT 256 11/25/2019 0918   MCV 73 (L) 11/25/2019 0918   MCH 21.4 (L) 11/25/2019 0918   MCH 24.9 (L) 04/06/2013 2035   MCHC 29.1 (L) 11/25/2019 0918   MCHC 32.3 04/06/2013 2035   RDW 17.0 (H) 11/25/2019 0918   LYMPHSABS 2.9 04/06/2013 2035   MONOABS 0.4 04/06/2013 2035   EOSABS 0.1 04/06/2013 2035   BASOSABS 0.0 04/06/2013 2035    ASSESSMENT AND PLAN: 1. Annual physical exam Commended her on healthy eating habits and regular exercise. Encouraged her to try to eat healthy. Advised to eliminate sugary drinks from the diet, incorporate fresh fruits and vegetables daily, eat smaller portion sizes of white carbohydrates and eat more lean white meat instead of red meat.  2. Type 2 diabetes mellitus with obesity ([place]) Not controlled. See dietary counseling given above. Increase NPH to 24 units twice a day. Continue Metformin. Trulicity would be a good option here but it is not covered by her insurance. Continue to monitor blood sugars and bring in log in 1 month to meet with the clinical pharmacist. - POCT glucose (manual entry) - POCT glycosylated hemoglobin (Hb A1C) - insulin NPH Human (NOVOLIN N) 100 UNIT/ML injection; Inject 0.24 mLs (  24 Units total) into the skin 2 (two) times daily before a meal. 16 units subcut in a.m and 18 units in p.m  Dispense: 10 mL; Refill: 11 - Ambulatory referral to Ophthalmology  3. Iron deficiency anemia due to chronic blood loss -  CBC  4. Pap smear for cervical cancer screening - Cervicovaginal ancillary only - Cytology - PAP  5. Hyperlipidemia associated with type 2 diabetes mellitus ([facility]) Continue Pravachol.  6. COVID-19 vaccine series completed Bring immunization card with our next visit so that we can update health maintenance.   Patient was given the opportunity to ask questions.  Patient verbalized understanding of the plan and was able to repeat key elements of the plan.  [employer] interpreter used during this encounter. #[id number]  Orders Placed This Encounter  Procedures  . CBC  . Ambulatory referral to Ophthalmology  . POCT glucose (manual entry)  . POCT glycosylated hemoglobin (Hb A1C)     Requested Prescriptions   Signed Prescriptions Disp Refills  . insulin NPH Human (NOVOLIN N) 100 UNIT/ML injection 10 mL 11    Sig: Inject 0.24 mLs (24 Units total) into the skin 2 (two) times daily before a meal. 16 units subcut in a.m and 18 units in p.m    Return in about 4 months (around 01/20/2021) for Give appt with Lurena Joiner in 1 mth for recheck DM.  Karle Plumber, MD, FACP

## 2020-09-22 ENCOUNTER — Telehealth (INDEPENDENT_AMBULATORY_CARE_PROVIDER_SITE_OTHER): Payer: Self-pay

## 2020-09-22 LAB — CBC
Hematocrit: 38.9 % (ref 34.0–46.6)
Hemoglobin: 12.2 g/dL (ref 11.1–15.9)
MCH: 25.3 pg — ABNORMAL LOW (ref 26.6–33.0)
MCHC: 31.4 g/dL — ABNORMAL LOW (ref 31.5–35.7)
MCV: 81 fL (ref 79–97)
Platelets: 233 10*3/uL (ref 150–450)
RBC: 4.83 x10E6/uL (ref 3.77–5.28)
RDW: 14.4 % (ref 11.7–15.4)
WBC: 11.4 10*3/uL — ABNORMAL HIGH (ref 3.4–10.8)

## 2020-09-22 NOTE — Telephone Encounter (Signed)
[employer] elisabeth  Id#  [phone] contacted pt to go over lab results pt didn't answer lvm

## 2020-09-22 NOTE — Progress Notes (Signed)
Let patient know that her anemia has significantly improved.  I would continue taking the iron supplement for now.  Instead of taking it every day she can take it 2-3 times a week.  She has mild elevation in her white blood cell count.  We will observe for now.

## 2020-09-23 LAB — CYTOLOGY - PAP
Comment: NEGATIVE
Diagnosis: NEGATIVE
High risk HPV: NEGATIVE

## 2020-09-24 ENCOUNTER — Telehealth: Payer: Self-pay

## 2020-09-24 LAB — CERVICOVAGINAL ANCILLARY ONLY
Bacterial Vaginitis (gardnerella): NEGATIVE
Chlamydia: NEGATIVE
Comment: NEGATIVE
Comment: NEGATIVE
Comment: NEGATIVE
Comment: NORMAL
Neisseria Gonorrhea: NEGATIVE
Trichomonas: NEGATIVE

## 2020-09-24 NOTE — Telephone Encounter (Signed)
[employer] Oswaldo Done  Id#  [id number] contacted pt to go over lab results pt is aware and doesn't have any questions or concerns

## 2020-10-22 ENCOUNTER — Other Ambulatory Visit: Payer: Self-pay

## 2020-10-22 ENCOUNTER — Ambulatory Visit: Payer: Commercial Managed Care - PPO | Attending: Internal Medicine | Admitting: Pharmacist

## 2020-10-22 ENCOUNTER — Encounter: Payer: Self-pay | Admitting: Pharmacist

## 2020-10-22 DIAGNOSIS — E1169 Type 2 diabetes mellitus with other specified complication: Secondary | ICD-10-CM

## 2020-10-22 DIAGNOSIS — E669 Obesity, unspecified: Secondary | ICD-10-CM

## 2020-10-22 MED ORDER — INSULIN PEN NEEDLE 32G X 4 MM MISC: 2 refills | Status: DC

## 2020-10-22 MED ORDER — OZEMPIC (0.25 OR 0.5 MG/DOSE) 2 MG/1.5ML ~~LOC~~ SOPN: 0.2500 mg | PEN_INJECTOR | SUBCUTANEOUS | 0 refills | Status: DC

## 2020-10-22 NOTE — Progress Notes (Signed)
   S:     PCP: Dr. Laural Benes  Patient in good spirits. Presents for diabetes evaluation, education, and management. Patient was referred and last seen by PCP on 09/21/2020.   Dx with DM >15 yrs ago. Started as gestational.   Family/Social History: FHx: Mother - diabetes, hypertension Tobacco use: never smoker, denies smokeless tobacco Alcohol: denies   [employer]chant [employer] affordability: UMR/UHC PPO  Current diabetes medications include: - NPH Novolin 24 units AM and 24 units PM - Metformin XR 1000 mg BID  Patient denies hypoglycemic events.  Patient reported dietary habits:  - Continuing to eat more salads and vegetables.  - Continuing to drink diet sodas rather than regular  Patient-reported exercise habits:  - Walks for 1 hour 4x/week   Patient denies nocturia (nighttime urination).  Patient denies neuropathy (nerve pain). Patient denies visual changes. Patient reports self-foot exams.     O:  Home fasting blood sugars:  Brings in her log: Fasting range: 94 - 245 Post-prandial range: 110 - 272  Lab Results  Component Value Date   HGBA1C 10.3 (A) 09/21/2020   There were no vitals filed for this visit.  Lipid Panel     Component Value Date/Time   CHOL 184 11/25/2019 0918   TRIG 114 11/25/2019 0918   HDL 58 11/25/2019 0918   CHOLHDL 3.2 11/25/2019 0918   CHOLHDL 4.2 05/19/2013 1703   VLDL 38 05/19/2013 1703   LDLCALC 106 (H) 11/25/2019 0918   Clinical Atherosclerotic Cardiovascular Disease (ASCVD): No  The ASCVD Risk score Denman George DC Jr., et al., 2013) failed to calculate for the following reasons:   The 2013 ASCVD risk score is only valid for ages 62 to 60   A/P:  Diabetes longstanding currently uncontrolled. Home sugars are above goal. Patient is able to verbalize appropriate hypoglycemia management plan. She has done well on Trulicity but this is not covered by her insurance. Ozempic seems to be covered. Will send rx for Ozempic.  -Start Ozempic  0.25 mg weekly.  -Continue other medications.  -Extensively discussed pathophysiology of diabetes, recommended lifestyle interventions, dietary effects on blood sugar control -Counseled on s/sx and management of hypoglycemia -Next A1c due 12/2020.  Written patient instructions provided. Total time in face to face counseling 20 minutes.    Follow up with me in 3 weeks.   [name], PharmD, [place], CPP Clinical Pharmacist [facility] & [facility] [phone]

## 2020-11-05 ENCOUNTER — Other Ambulatory Visit: Payer: Self-pay

## 2020-11-05 ENCOUNTER — Ambulatory Visit: Payer: Commercial Managed Care - PPO | Attending: Internal Medicine | Admitting: Pharmacist

## 2020-11-05 DIAGNOSIS — IMO0002 Reserved for concepts with insufficient information to code with codable children: Secondary | ICD-10-CM

## 2020-11-05 DIAGNOSIS — E1169 Type 2 diabetes mellitus with other specified complication: Secondary | ICD-10-CM

## 2020-11-05 DIAGNOSIS — E1142 Type 2 diabetes mellitus with diabetic polyneuropathy: Secondary | ICD-10-CM

## 2020-11-05 DIAGNOSIS — E785 Hyperlipidemia, unspecified: Secondary | ICD-10-CM

## 2020-11-05 DIAGNOSIS — E1165 Type 2 diabetes mellitus with hyperglycemia: Secondary | ICD-10-CM

## 2020-11-05 MED ORDER — PRAVASTATIN SODIUM 20 MG PO TABS: ORAL_TABLET | ORAL | 1 refills | Status: DC

## 2020-11-05 MED ORDER — METFORMIN HCL ER 500 MG PO TB24: ORAL_TABLET | ORAL | 1 refills | Status: DC

## 2020-11-05 NOTE — Progress Notes (Signed)
   S:    Virtual Visit via Telephone Note  I connected with Brenda Greer on 11/05/2020 at 10:30 AM by telephonedue to the COVID-19 pandemic and verified that I am speaking with the correct person using two identifiers.  Consent: I discussed the limitations, risks, security and privacy concerns of performing an evaluation and management service by telephone and the availability of in person appointments. I also discussed with the patient that there may be a patient responsible charge related to this service. The patient expressed understanding and agreed to proceed.   Location of Patient: Home  Location of Provider: Clinic   Persons participating in Telemedicine visit: Patient Brenda Greer, PharmD, [place], CPP Adam Phenix, PharmD Candidate  PCP: Dr. Laural Benes  Patient in good spirits. Presents via telephone with interpreter for diabetes evaluation, education, and management.   Patient was referred and last seen by PCP on 09/21/2020 and CPP on 10/22/2020. Today, pt reports she was unable to pick up test strips from [facility] due to cost and she has run out. She reports picking up Ozempic yesterday (1/20) and taking first dose this AM. She reports that she is still waiting on opthalmology referral and reports occasional dizziness that worsens during her menstrual cycle.  Dx with DM >15 yrs ago. Started as gestational.   Family/Social History: FHx: Mother - diabetes, hypertension Tobacco use: never smoker, denies smokeless tobacco Alcohol: denies   [employer]chant [employer] affordability: UMR/UHC PPO  Current diabetes medications include: - NPH Novolin 24 units AM and 24 units PM - Metformin XR 1000 mg BID - Ozempic 0.25 mg once weekly  Patient denies hypoglycemic events.  Patient reported dietary habits:  - Continuing to eat more salads and vegetables.  - Continuing to drink diet sodas rather than regular  Patient-reported exercise habits:   - Walks for 1 hour 4x/week   Patient denies nocturia (nighttime urination).  Patient denies neuropathy (nerve pain). Patient denies visual changes. Patient reports self-foot exams.     O:  BG: none  Lab Results  Component Value Date   HGBA1C 10.3 (A) 09/21/2020   There were no vitals filed for this visit.  Lipid Panel     Component Value Date/Time   CHOL 184 11/25/2019 0918   TRIG 114 11/25/2019 0918   HDL 58 11/25/2019 0918   CHOLHDL 3.2 11/25/2019 0918   CHOLHDL 4.2 05/19/2013 1703   VLDL 38 05/19/2013 1703   LDLCALC 106 (H) 11/25/2019 0918   Clinical Atherosclerotic Cardiovascular Disease (ASCVD): No  The ASCVD Risk score Denman George DC Jr., et al., 2013) failed to calculate for the following reasons:   The 2013 ASCVD risk score is only valid for ages 23 to 6   A/P:  Diabetes longstanding currently uncontrolled. Patient is able to verbalize appropriate hypoglycemia management plan. No changes today since she just started Ozempic. - Continue all medications - Instructed pt to pick up test strips at [facility] pharmacy - Will reach out to PCP regarding opthalmology referral -Extensively discussed pathophysiology of diabetes, recommended lifestyle interventions, dietary effects on blood sugar control -Counseled on s/sx and management of hypoglycemia -Next A1c due 12/2020.  Written patient instructions provided. Total time in face to face counseling 20 minutes.    Follow up with me in 4 weeks.   Adam Phenix, PharmD Candidate [employer]  Brenda Greer, PharmD, [place], CPP Clinical Pharmacist [facility] [phone]

## 2020-11-06 ENCOUNTER — Telehealth: Payer: Self-pay | Admitting: Internal Medicine

## 2020-11-06 DIAGNOSIS — E1142 Type 2 diabetes mellitus with diabetic polyneuropathy: Secondary | ICD-10-CM

## 2020-11-06 DIAGNOSIS — IMO0002 Reserved for concepts with insufficient information to code with codable children: Secondary | ICD-10-CM

## 2020-11-06 NOTE — Telephone Encounter (Signed)
Note from clinical pharmacist student noted.  Will refer to general ophthalmologist.

## 2020-11-06 NOTE — Telephone Encounter (Signed)
-----   Message from Drucilla Chalet, RPH-CPP sent at 11/05/2020 11:38 AM EST ----- Regarding: Eye Dr referral Dr. Laural Benes, This is Florentina Addison, [employer] student. We saw the above patient for T2DM management today. Her ophthalmology referral was denied. Denial said to send referral for general ophthalmologist instead of retinal specialist.  Thanks, Adam Phenix, PharmD Candidate

## 2020-12-03 ENCOUNTER — Telehealth: Payer: Self-pay | Admitting: Internal Medicine

## 2020-12-03 NOTE — Telephone Encounter (Signed)
Pt is calling to reschedule her appt on Monday with [facility]. Please advise

## 2020-12-03 NOTE — Telephone Encounter (Signed)
Called patient and rescheduled her appointment for 12/17/20

## 2020-12-06 ENCOUNTER — Ambulatory Visit: Payer: Commercial Managed Care - PPO | Admitting: Pharmacist

## 2020-12-17 ENCOUNTER — Ambulatory Visit: Payer: Commercial Managed Care - PPO | Attending: Internal Medicine | Admitting: Pharmacist

## 2020-12-17 ENCOUNTER — Other Ambulatory Visit: Payer: Self-pay

## 2020-12-17 ENCOUNTER — Encounter: Payer: Self-pay | Admitting: Pharmacist

## 2020-12-17 DIAGNOSIS — E1165 Type 2 diabetes mellitus with hyperglycemia: Secondary | ICD-10-CM

## 2020-12-17 DIAGNOSIS — E1142 Type 2 diabetes mellitus with diabetic polyneuropathy: Secondary | ICD-10-CM | POA: Diagnosis not present

## 2020-12-17 DIAGNOSIS — E669 Obesity, unspecified: Secondary | ICD-10-CM | POA: Diagnosis not present

## 2020-12-17 DIAGNOSIS — IMO0002 Reserved for concepts with insufficient information to code with codable children: Secondary | ICD-10-CM

## 2020-12-17 DIAGNOSIS — E1169 Type 2 diabetes mellitus with other specified complication: Secondary | ICD-10-CM

## 2020-12-17 LAB — GLUCOSE, POCT (MANUAL RESULT ENTRY): POC Glucose: 211 mg/dl — AB (ref 70–99)

## 2020-12-17 MED ORDER — ONETOUCH DELICA LANCETS 33G MISC: 6 refills | Status: DC

## 2020-12-17 MED ORDER — ONETOUCH VERIO W/DEVICE KIT: PACK | 0 refills | Status: DC

## 2020-12-17 MED ORDER — ONETOUCH VERIO VI STRP: ORAL_STRIP | 12 refills | Status: DC

## 2020-12-17 MED ORDER — OZEMPIC (0.25 OR 0.5 MG/DOSE) 2 MG/1.5ML ~~LOC~~ SOPN: 0.5000 mg | PEN_INJECTOR | SUBCUTANEOUS | 2 refills | Status: DC

## 2020-12-17 NOTE — Progress Notes (Signed)
   S:    PCP: Dr. Laural Benes  Patient in good spirits. Presents for diabetes evaluation, education, and management.  Patient was referred and last seen by PCP on 09/21/2020 and CPP on 11/05/2020. At that visit, we made no changes since the patient had only just started her Ozempic.   Today, pt endorses compliance with her medications. She is still not checking CBGs at home as her testing supplies were not sent to [facility].   Dx with DM >15 yrs ago. Started as gestational.   Family/Social History: FHx: Mother - diabetes, hypertension Tobacco use: never smoker, denies smokeless tobacco Alcohol: denies   [employer]chant [employer] affordability: UMR/UHC PPO  Current diabetes medications include: - NPH Novolin 24 units AM and 24 units PM - Metformin XR 1000 mg BID - Ozempic 0.25 mg once weekly  Patient denies hypoglycemic events.  Patient reported dietary habits:  - Continuing to eat more salads and vegetables.  - Continuing to drink diet sodas rather than regular  Patient-reported exercise habits:  - Walks for 1 hour 4x/week   Patient denies nocturia (nighttime urination).  Patient denies neuropathy (nerve pain). Patient denies visual changes. Patient reports self-foot exams.     O:  BG: 211  Lab Results  Component Value Date   HGBA1C 10.3 (A) 09/21/2020   There were no vitals filed for this visit.  Lipid Panel     Component Value Date/Time   CHOL 184 11/25/2019 0918   TRIG 114 11/25/2019 0918   HDL 58 11/25/2019 0918   CHOLHDL 3.2 11/25/2019 0918   CHOLHDL 4.2 05/19/2013 1703   VLDL 38 05/19/2013 1703   LDLCALC 106 (H) 11/25/2019 0918   Clinical Atherosclerotic Cardiovascular Disease (ASCVD): No  The ASCVD Risk score Denman George DC Jr., et al., 2013) failed to calculate for the following reasons:   The 2013 ASCVD risk score is only valid for ages 61 to 62   A/P:  Diabetes longstanding currently uncontrolled. Patient is able to verbalize appropriate hypoglycemia  management plan. She is adherent to medications but is still not checking her CBGs.  - Increase Ozempic to 0.5 mg weekly.  -Continue Novolin N 24 units BID. -Continue metformin 1000 mg XR BID.  - Resent testing supplies to [employer].  -Extensively discussed pathophysiology of diabetes, recommended lifestyle interventions, dietary effects on blood sugar control -Counseled on s/sx and management of hypoglycemia -A1c due - will increase Ozempic and have this checked next month with her PCP.   Written patient instructions provided. Total time in face to face counseling 20 minutes.   Follow up with PCP next month.   [name], PharmD, [place], CPP Clinical Pharmacist [facility] [phone]

## 2021-01-18 ENCOUNTER — Other Ambulatory Visit: Payer: Self-pay | Admitting: Internal Medicine

## 2021-01-18 DIAGNOSIS — E785 Hyperlipidemia, unspecified: Secondary | ICD-10-CM

## 2021-01-18 DIAGNOSIS — E1169 Type 2 diabetes mellitus with other specified complication: Secondary | ICD-10-CM

## 2021-01-20 ENCOUNTER — Other Ambulatory Visit: Payer: Self-pay

## 2021-01-20 ENCOUNTER — Ambulatory Visit: Payer: Commercial Managed Care - PPO | Attending: Internal Medicine | Admitting: Internal Medicine

## 2021-01-20 ENCOUNTER — Encounter: Payer: Self-pay | Admitting: Internal Medicine

## 2021-01-20 VITALS — BP 123/77 | HR 84 | Resp 18 | Ht 62.0 in | Wt 151.2 lb

## 2021-01-20 DIAGNOSIS — E1169 Type 2 diabetes mellitus with other specified complication: Secondary | ICD-10-CM

## 2021-01-20 DIAGNOSIS — E785 Hyperlipidemia, unspecified: Secondary | ICD-10-CM

## 2021-01-20 DIAGNOSIS — E1142 Type 2 diabetes mellitus with diabetic polyneuropathy: Secondary | ICD-10-CM | POA: Diagnosis not present

## 2021-01-20 LAB — POCT GLYCOSYLATED HEMOGLOBIN (HGB A1C): HbA1c, POC (controlled diabetic range): 9.9 % — AB (ref 0.0–7.0)

## 2021-01-20 LAB — GLUCOSE, POCT (MANUAL RESULT ENTRY): POC Glucose: 153 mg/dl — AB (ref 70–99)

## 2021-01-20 MED ORDER — OZEMPIC (0.25 OR 0.5 MG/DOSE) 2 MG/1.5ML ~~LOC~~ SOPN: 0.5000 mg | PEN_INJECTOR | SUBCUTANEOUS | 2 refills | Status: DC

## 2021-01-20 MED ORDER — ONETOUCH VERIO VI STRP
ORAL_STRIP | 12 refills | Status: DC
Start: 2021-01-20 — End: 2021-01-20

## 2021-01-20 MED ORDER — ONETOUCH VERIO VI STRP: ORAL_STRIP | 12 refills | Status: DC

## 2021-01-20 MED ORDER — METFORMIN HCL ER 500 MG PO TB24: ORAL_TABLET | ORAL | 2 refills | Status: DC

## 2021-01-20 MED ORDER — PRAVASTATIN SODIUM 20 MG PO TABS: ORAL_TABLET | ORAL | 6 refills | Status: DC

## 2021-01-20 NOTE — Progress Notes (Signed)
Patient ID: Brenda Greer, female    DOB: Jul 30, 1986  MRN: [id number]  CC: Diabetes   Subjective: Brenda Greer is a 35 y.o. female who presents for chronic ds management Her concerns today include:  Pt has DM, HL, GERD, Iron def anemia, migraines without aura.  DIABETES TYPE 2 Last A1C:   Results for orders placed or performed in visit on 01/20/21  POCT glucose (manual entry)  Result Value Ref Range   POC Glucose 153 (A) 70 - 99 mg/dl  POCT glycosylated hemoglobin (Hb A1C)  Result Value Ref Range   Hemoglobin A1C     HbA1c POC (<> result, manual entry)     HbA1c, POC (prediabetic range)     HbA1c, POC (controlled diabetic range) 9.9 (A) 0.0 - 7.0 %    Med Adherence:  _0  Yes  -NPH 24 units BID, Metformin 1 gram BID and Ozempic.  Suppose to increase latter from 0.25 mg to 0.5 but not sure how to do it.  Has pen with her today Medication side effects:  _1  Yes    _2  No Home Monitoring?  _3  Yes    _4  No.  Out of stripes Home glucose results range: Diet Adherence: decrease appetite with Ozempic Exercise: _5  Yes  Walks for 2 hrs 3x/wk.  Wgh down 6 lbs since 09/2020  Hypoglycemic episodes?: _6  Yes    _7  No Numbness of the feet? _8  Yes    _9  No Retinopathy hx? _10  Yes    _11  No Last eye exam:  Has appt with ophthalmologist in May. Comments:   HL:  Taking and tolerating Pravachol daily.  Requests refill.   Patient Active Problem List   Diagnosis Date Noted  . COVID-19 vaccine series completed 09/21/2020  . Uncontrolled type 2 diabetes mellitus with peripheral neuropathy ([place]) 07/18/2019  . Migraine without aura and without status migrainosus, not intractable 07/18/2019  . Hyperlipidemia associated with type 2 diabetes mellitus ([place]) 07/18/2019  . Iron deficiency anemia due to chronic blood loss 07/02/2018  . Hyperlipidemia 02/26/2018  . Gastroesophageal reflux disease without esophagitis 02/26/2018  . Diabetes mellitus without complication ([place])  96/78/9381  . Type II or unspecified type diabetes mellitus without mention of complication, uncontrolled 05/19/2013  . OBESITY, NOS 12/13/2006     Current Outpatient Medications on File Prior to Visit  Medication Sig Dispense Refill  . Blood Glucose Monitoring Suppl (ONETOUCH VERIO REFLECT) w/Device KIT USE AS INSTRUCTED TO CHECK BLOOD SUGAR THREE TIMES DAILY. E11.42 1 kit 0  . Blood Glucose Monitoring Suppl (ONETOUCH VERIO) w/Device KIT Use as instructed to check blood sugar three times daily. E11.42 1 kit 0  . ferrous sulfate 325 (65 FE) MG EC tablet TAKE 1 TABLET BY MOUTH ONCE DAILY WITH BREAKFAST 100 tablet 1  . gabapentin (NEURONTIN) 300 MG capsule Take 1 capsule (300 mg total) by mouth at bedtime. 30 capsule 3  . insulin NPH Human (NOVOLIN N) 100 UNIT/ML injection Inject 0.24 mLs (24 Units total) into the skin 2 (two) times daily before a meal. 16 units subcut in a.m and 18 units in p.m 10 mL 11  . Insulin Pen Needle 32G X 4 MM MISC Use once weekly to inject Ozempic. 100 each 2  . Insulin Syringe-Needle U-100 (INSULIN SYRINGE 1CC/31GX5/16") 31G X 5/16" 1 ML MISC Use as directed 100 each 6  . Multiple Vitamin (MULTIVITAMIN WITH MINERALS) TABS Take 1 tablet by mouth daily.    Glory Rosebush Delica Lancets 01B MISC Use as instructed to  check blood sugar 3 times daily. Dx code: E11.42 100 each 6  . pantoprazole (PROTONIX) 20 MG tablet Take 1 tablet (20 mg total) by mouth daily. 30 tablet 2  . SUMAtriptan (IMITREX) 50 MG tablet take 1 tab at start of Headache.  May repeat in 2 hrs if no relief.  Max 2 tabs/24 hrs. 10 tablet 2  . topiramate (TOPAMAX) 25 MG tablet Take 1 tablet (25 mg total) by mouth at bedtime. 30 tablet 1   No current facility-administered medications on file prior to visit.    No Known Allergies  Social History   Socioeconomic History  . Marital status: Single    Spouse name: Not on file  . Number of children: Not on file  . Years of education: Not on file  . Highest  education level: Not on file  Occupational History  . Not on file  Tobacco Use  . Smoking status: Never Smoker  . Smokeless tobacco: Never Used  Substance and Sexual Activity  . Alcohol use: No  . Drug use: No  . Sexual activity: Not on file  Other Topics Concern  . Not on file  Social History Narrative  . Not on file   Social Determinants of Health   Financial Resource Strain: Not on file  Food Insecurity: Not on file  Transportation Needs: Not on file  Physical Activity: Not on file  Stress: Not on file  Social Connections: Not on file  Intimate Partner Violence: Not on file    Family History  Problem Relation Age of Onset  . Diabetes Mother   . Hypertension Mother     No past surgical history on file.  ROS: Review of Systems Negative except as stated above  PHYSICAL EXAM: BP 123/77   Pulse 84   Resp 18   Ht _0  (1.575 m)   Wt 151 lb 3.2 oz (68.6 kg)   SpO2 100%   BMI 27.65 kg/m   Wt Readings from Last 3 Encounters:  01/20/21 151 lb 3.2 oz (68.6 kg)  09/21/20 157 lb 9.6 oz (71.5 kg)  07/18/19 159 lb (72.1 kg)    Physical Exam  General appearance - alert, well appearing, and in no distress Mental status - normal mood, behavior, speech, dress, motor activity, and thought processes Chest - clear to auscultation, no wheezes, rales or rhonchi, symmetric air entry Heart - normal rate, regular rhythm, normal S1, S2, no murmurs, rubs, clicks or gallops Extremities - peripheral pulses normal, no pedal edema, no clubbing or cyanosis   CMP Latest Ref Rng & Units 11/25/2019 11/07/2018 09/10/2017  Glucose 65 - 99 mg/dL 266(H) 277(H) 171(H)  BUN 6 - 20 mg/dL _1 Creatinine 0.57 - 1.00 mg/dL 0.47(L) 0.49(L) 0.50(L)  Sodium 134 - 144 mmol/L 138 137 140  Potassium 3.5 - 5.2 mmol/L 3.9 4.4 4.1  Chloride 96 - 106 mmol/L 103 102 104  CO2 20 - 29 mmol/L 19(L) 20 20  Calcium 8.7 - 10.2 mg/dL 9.0 9.6 9.1  Total Protein 6.0 - 8.5 g/dL 6.7 7.3 7.2  Total Bilirubin  0.0 - 1.2 mg/dL 0.3 0.4 <0.2  Alkaline Phos 39 - 117 IU/L 82 72 64  AST 0 - 40 IU/L 41(H) 34 21  ALT 0 - 32 IU/L 43(H) 29 16   Lipid Panel     Component Value Date/Time   CHOL 184 11/25/2019 0918   TRIG 114 11/25/2019 0918   HDL 58 11/25/2019 0918   CHOLHDL 3.2 11/25/2019  0918   CHOLHDL 4.2 05/19/2013 1703   VLDL 38 05/19/2013 1703   LDLCALC 106 (H) 11/25/2019 0918    CBC    Component Value Date/Time   WBC 11.4 (H) 09/21/2020 1616   WBC 9.4 04/06/2013 2035   RBC 4.83 09/21/2020 1616   RBC 4.10 04/06/2013 2035   HGB 12.2 09/21/2020 1616   HCT 38.9 09/21/2020 1616   PLT 233 09/21/2020 1616   MCV 81 09/21/2020 1616   MCH 25.3 (L) 09/21/2020 1616   MCH 24.9 (L) 04/06/2013 2035   MCHC 31.4 (L) 09/21/2020 1616   MCHC 32.3 04/06/2013 2035   RDW 14.4 09/21/2020 1616   LYMPHSABS 2.9 04/06/2013 2035   MONOABS 0.4 04/06/2013 2035   EOSABS 0.1 04/06/2013 2035   BASOSABS 0.0 04/06/2013 2035    ASSESSMENT AND PLAN: 1. Type 2 diabetes mellitus with peripheral neuropathy (HCC) A1c not at goal but has decreased slightly.  She does not have blood sugar readings to share.  I have sent refill on the strips for her glucometer so that she can resume checking blood sugars. Commended her on regular exercise.  She has had some decreased appetite and weight loss as expected with Ozempic. Continue current dose of insulin and  Metformin I had the clinical pharmacist meet with her today to show her how to increase the Ozempic to the 0.5 mg dose.  - POCT glucose (manual entry) - POCT glycosylated hemoglobin (Hb A1C) - Microalbumin / creatinine urine ratio - CBC - Comprehensive metabolic panel - metFORMIN (GLUCOPHAGE-XR) 500 MG 24 hr tablet; TAKE 2 TABLETS BY MOUTH TWICE DAILY WITH A MEAL  Dispense: 360 tablet; Refill: 2 - glucose blood (ONETOUCH VERIO) test strip; Use as instructed to check blood sugar 3 times daily. Dx code: E11.42  Dispense: 100 each; Refill: 12 - Semaglutide,0.25 or  0.5MG/DOS, (OZEMPIC, 0.25 OR 0.5 MG/DOSE,) 2 MG/1.5ML SOPN; Inject 0.5 mg into the skin once a week.  Dispense: 1.5 mL; Refill: 2  2. Hyperlipidemia associated with type 2 diabetes mellitus ([place]) Continue pravastatin Due for lipid check.  Last LDL was 106. - Lipid panel - pravastatin (PRAVACHOL) 20 MG tablet; Take 20 mg 1.5 tabs PO daily  Dispense: 45 tablet; Refill: 6     Patient was given the opportunity to ask questions.  Patient verbalized understanding of the plan and was able to repeat key elements of the plan.  [employer] Language interpreter used during this encounter. #[id number], [place].  Orders Placed This Encounter  Procedures  . Microalbumin / creatinine urine ratio  . CBC  . Comprehensive metabolic panel  . Lipid panel  . POCT glucose (manual entry)  . POCT glycosylated hemoglobin (Hb A1C)     Requested Prescriptions   Signed Prescriptions Disp Refills  . metFORMIN (GLUCOPHAGE-XR) 500 MG 24 hr tablet 360 tablet 2    Sig: TAKE 2 TABLETS BY MOUTH TWICE DAILY WITH A MEAL  . pravastatin (PRAVACHOL) 20 MG tablet 45 tablet 6    Sig: Take 20 mg 1.5 tabs PO daily  . glucose blood (ONETOUCH VERIO) test strip 100 each 12    Sig: Use as instructed to check blood sugar 3 times daily. Dx code: E11.42  . Semaglutide,0.25 or 0.5MG/DOS, (OZEMPIC, 0.25 OR 0.5 MG/DOSE,) 2 MG/1.5ML SOPN 1.5 mL 2    Sig: Inject 0.5 mg into the skin once a week.    Return in about 4 months (around 05/22/2021).  Karle Plumber, MD, FACP

## 2021-01-21 LAB — COMPREHENSIVE METABOLIC PANEL
ALT: 17 IU/L (ref 0–32)
AST: 23 IU/L (ref 0–40)
Albumin/Globulin Ratio: 1.3 (ref 1.2–2.2)
Albumin: 4 g/dL (ref 3.8–4.8)
Alkaline Phosphatase: 71 IU/L (ref 44–121)
BUN/Creatinine Ratio: 12 (ref 9–23)
BUN: 5 mg/dL — ABNORMAL LOW (ref 6–20)
Bilirubin Total: <0.2 mg/dL (ref 0.0–1.2)
CO2: 22 mmol/L (ref 20–29)
Calcium: 9.5 mg/dL (ref 8.7–10.2)
Chloride: 102 mmol/L (ref 96–106)
Creatinine, Ser: 0.43 mg/dL — ABNORMAL LOW (ref 0.57–1.00)
Globulin, Total: 3 g/dL (ref 1.5–4.5)
Glucose: 156 mg/dL — ABNORMAL HIGH (ref 65–99)
Potassium: 3.8 mmol/L (ref 3.5–5.2)
Sodium: 138 mmol/L (ref 134–144)
Total Protein: 7 g/dL (ref 6.0–8.5)
eGFR: 130 mL/min/{1.73_m2} (ref >59)

## 2021-01-21 LAB — LIPID PANEL
Chol/HDL Ratio: 3.6 ratio (ref 0.0–4.4)
Cholesterol, Total: 225 mg/dL — ABNORMAL HIGH (ref 100–199)
HDL: 63 mg/dL (ref >39)
LDL Chol Calc (NIH): 147 mg/dL — ABNORMAL HIGH (ref 0–99)
Triglycerides: 87 mg/dL (ref 0–149)
VLDL Cholesterol Cal: 15 mg/dL (ref 5–40)

## 2021-01-21 LAB — MICROALBUMIN / CREATININE URINE RATIO
Creatinine, Urine: 21.8 mg/dL
Microalb/Creat Ratio: 70 mg/g creat — ABNORMAL HIGH (ref 0–29)
Microalbumin, Urine: 15.2 ug/mL

## 2021-01-21 LAB — CBC
Hematocrit: 37.6 % (ref 34.0–46.6)
Hemoglobin: 12 g/dL (ref 11.1–15.9)
MCH: 26.4 pg — ABNORMAL LOW (ref 26.6–33.0)
MCHC: 31.9 g/dL (ref 31.5–35.7)
MCV: 83 fL (ref 79–97)
Platelets: 215 10*3/uL (ref 150–450)
RBC: 4.54 x10E6/uL (ref 3.77–5.28)
RDW: 14.5 % (ref 11.7–15.4)
WBC: 7.9 10*3/uL (ref 3.4–10.8)

## 2021-01-21 NOTE — Progress Notes (Signed)
Let patient know that her blood cell counts are normal.  No anemia.  Kidney and liver function tests are normal.  Cholesterol levels are elevated.  Please confirm that she had been taking the pravastatin consistently for the 2 months prior to the blood draw yesterday.  If she had been out of the medication for a week or 2 prior to her visit yesterday, it is most likely the reason that the cholesterol is elevated.  However if she had not been out of the pravastatin, then we will need to increase the dose to 40 mg daily which means she will need to start taking 2 of the 20 mg tablets daily until she runs out of the bottle that she currently has if she picked up the refill that was sent yesterday.  Please let me know the patient's response.

## 2021-01-22 ENCOUNTER — Other Ambulatory Visit: Payer: Self-pay | Admitting: Internal Medicine

## 2021-01-22 ENCOUNTER — Encounter: Payer: Self-pay | Admitting: Internal Medicine

## 2021-01-22 DIAGNOSIS — E1129 Type 2 diabetes mellitus with other diabetic kidney complication: Secondary | ICD-10-CM | POA: Insufficient documentation

## 2021-01-22 DIAGNOSIS — E1169 Type 2 diabetes mellitus with other specified complication: Secondary | ICD-10-CM

## 2021-01-22 DIAGNOSIS — R809 Proteinuria, unspecified: Secondary | ICD-10-CM | POA: Insufficient documentation

## 2021-01-22 DIAGNOSIS — E785 Hyperlipidemia, unspecified: Secondary | ICD-10-CM

## 2021-01-22 MED ORDER — PRAVASTATIN SODIUM 40 MG PO TABS: 40.0000 mg | ORAL_TABLET | Freq: Every day | ORAL | 5 refills | Status: DC

## 2021-01-22 NOTE — Progress Notes (Signed)
Let patient know that she has a mild amount of protein buildup in the urine.  This is most likely due to diabetes not being well controlled.  Stressed the importance of good diabetes control to prevent it from causing damage to the kidneys.  We will check the urine again in about 6 months.  If the protein in the urine persists, then we will add a medication called lisinopril to help protect the kidneys from the effects of diabetes.

## 2021-01-24 ENCOUNTER — Telehealth: Payer: Self-pay

## 2021-01-24 ENCOUNTER — Other Ambulatory Visit: Payer: Self-pay | Admitting: Internal Medicine

## 2021-01-24 DIAGNOSIS — E1142 Type 2 diabetes mellitus with diabetic polyneuropathy: Secondary | ICD-10-CM

## 2021-01-24 NOTE — Telephone Encounter (Signed)
Medication: metFORMIN (GLUCOPHAGE-XR) 500 MG 24 hr tablet [[id number]] - Pt is calling to ask if this medication can be resent Pharmacy did not receive the script.  Has the patient contacted their pharmacy? YES (Agent: If no, request that the patient contact the pharmacy for the refill.) (Agent: If yes, when and what did the pharmacy advise?)  Preferred Pharmacy (with phone number or street name): [facility] [address], [place] - [address] [place] [address] Phone: [phone] Fax: [phone] Hours: Not open 24 hours    Agent: Please be advised that RX refills may take up to 3 business days. We ask that you follow-up with your pharmacy.

## 2021-01-24 NOTE — Telephone Encounter (Signed)
Received a staff message from provider stating "Please call [facility] [address] and let them know that Pravachol has been increased to 40 mg daily"  Contacted the pharmacy and spoke to [facility] and they are aware of the dose change and waiting for pt to pick up

## 2021-01-24 NOTE — Telephone Encounter (Signed)
Copied from CRM [id number]. Topic: General - Other >> Jan 21, 2021 12:10 PM Pawlus, Maxine Glenn A wrote: Reason for CRM: [facility] pharmacy called and needed clarification regarding pravastatin (PRAVACHOL) 20 MG tablet, it states take 20 mg, 1.5 Tablets, Pharmacy wanted to know if you meant 30mg ? Please call back.

## 2021-01-26 ENCOUNTER — Telehealth: Payer: Self-pay | Admitting: Internal Medicine

## 2021-01-26 DIAGNOSIS — E1169 Type 2 diabetes mellitus with other specified complication: Secondary | ICD-10-CM

## 2021-01-26 MED ORDER — PRAVASTATIN SODIUM 40 MG PO TABS: 40.0000 mg | ORAL_TABLET | Freq: Every day | ORAL | 1 refills | Status: DC

## 2021-01-26 NOTE — Telephone Encounter (Signed)
[facility] called to ask the nurse or doctor to send a new script for the   pravastatin (PRAVACHOL) 40 MG tablet.  She stated that the dosage has changed and they only have a script for the 20MG .  Please advise and call to confirm at [phone]

## 2021-01-26 NOTE — Telephone Encounter (Signed)
Rx sent 

## 2021-01-27 NOTE — Telephone Encounter (Signed)
Trenton Founds, from [facility], calling in stating that she is needing to have clarification on this perscription. She states that the prescription is stating for the pt to take 20 mg, but also states that the pt needs to be taking 1.5 tablets a day. Please advise.

## 2021-01-28 ENCOUNTER — Other Ambulatory Visit: Payer: Self-pay | Admitting: Internal Medicine

## 2021-01-28 DIAGNOSIS — E1169 Type 2 diabetes mellitus with other specified complication: Secondary | ICD-10-CM

## 2021-01-28 MED ORDER — PRAVASTATIN SODIUM 40 MG PO TABS: 40.0000 mg | ORAL_TABLET | Freq: Every day | ORAL | 1 refills | Status: DC

## 2021-01-28 NOTE — Telephone Encounter (Signed)
[facility] called and asked if the Rx for pravastatin (PRAVACHOL) 40 MG tablet  Can be resent to them/ they didn't see this current Rx in the system/ they have an Rx for 20mg  /please advise   [address], [place] - [place] [address] Phone:  [phone]  Fax:  [phone]

## 2021-01-31 NOTE — Telephone Encounter (Signed)
Call placed to the pharmacy. Per chart, pt is to be taking 40mg  daily of pravastatin. I have informed the pharmacy of this. They have discontinued prior pravastatin dose.

## 2021-05-23 ENCOUNTER — Other Ambulatory Visit: Payer: Self-pay

## 2021-05-23 ENCOUNTER — Ambulatory Visit: Payer: Commercial Managed Care - PPO | Attending: Internal Medicine | Admitting: Internal Medicine

## 2021-05-23 DIAGNOSIS — Z91199 Patient's noncompliance with other medical treatment and regimen due to unspecified reason: Secondary | ICD-10-CM

## 2021-05-23 DIAGNOSIS — Z5329 Procedure and treatment not carried out because of patient's decision for other reasons: Secondary | ICD-10-CM

## 2021-05-23 NOTE — Progress Notes (Signed)
Patient was supposed to have telephone visit with me this afternoon.  I called her using the interpreter at 4:38 PM.  We received her voicemail.  Message left for her to call us back to reschedule.  We then called the home phone number.  The mailbox was full and we were unable to leave a message to there.  [employer] interpreter: [phone], Zacarias Pontes

## 2021-07-01 ENCOUNTER — Other Ambulatory Visit (HOSPITAL_COMMUNITY): Payer: Self-pay

## 2021-10-26 ENCOUNTER — Other Ambulatory Visit: Payer: Self-pay

## 2021-10-26 ENCOUNTER — Emergency Department (HOSPITAL_COMMUNITY)
Admission: EM | Admit: 2021-10-26 | Discharge: 2021-10-26 | Disposition: A | Discharge status: Home / Self Care | Payer: Commercial Managed Care - PPO | Attending: Emergency Medicine | Admitting: Emergency Medicine

## 2021-10-26 ENCOUNTER — Encounter (HOSPITAL_COMMUNITY): Payer: Self-pay

## 2021-10-26 DIAGNOSIS — E119 Type 2 diabetes mellitus without complications: Secondary | ICD-10-CM | POA: Diagnosis not present

## 2021-10-26 DIAGNOSIS — Z7984 Long term (current) use of oral hypoglycemic drugs: Secondary | ICD-10-CM | POA: Insufficient documentation

## 2021-10-26 DIAGNOSIS — N760 Acute vaginitis: Secondary | ICD-10-CM | POA: Diagnosis not present

## 2021-10-26 DIAGNOSIS — B009 Herpesviral infection, unspecified: Secondary | ICD-10-CM | POA: Insufficient documentation

## 2021-10-26 LAB — WET PREP, GENITAL
Clue Cells Wet Prep HPF POC: NONE SEEN
Sperm: NONE SEEN
Trich, Wet Prep: NONE SEEN
WBC, Wet Prep HPF POC: <10 (ref <10)
Yeast Wet Prep HPF POC: NONE SEEN

## 2021-10-26 LAB — URINALYSIS, ROUTINE W REFLEX MICROSCOPIC
Bacteria, UA: NONE SEEN
Bilirubin Urine: NEGATIVE
Glucose, UA: >=500 mg/dL — AB
Hgb urine dipstick: NEGATIVE
Ketones, ur: NEGATIVE mg/dL
Leukocytes,Ua: NEGATIVE
Nitrite: NEGATIVE
Protein, ur: NEGATIVE mg/dL
Specific Gravity, Urine: 1.028 (ref 1.005–1.030)
pH: 7 (ref 5.0–8.0)

## 2021-10-26 LAB — PREGNANCY, URINE: Preg Test, Ur: NEGATIVE

## 2021-10-26 MED ORDER — VALACYCLOVIR HCL 1 G PO TABS: 1000.0000 mg | ORAL_TABLET | Freq: Two times a day (BID) | ORAL | 0 refills | Status: AC

## 2021-10-26 NOTE — ED Triage Notes (Signed)
Pt feels like she has a yeast infection x 2 days. Pt states that her vulva is swollen and painful and she is having thick white discharge. Pt reports having diabetes.

## 2021-10-26 NOTE — ED Provider Triage Note (Signed)
Emergency Medicine Provider Triage Evaluation Note  Lilliann Rossetti , a 36 y.o. female  was evaluated in triage.  Pt complains of diarrhea, dysuria and some abd discomfort along with some vaginal irritation.  White DC.   Review of Systems  Positive: Vaginal DC/irritation, lower abd discomfort and diarrhea Negative: Fever   Physical Exam  BP 137/79 (BP Location: Left Arm)    Pulse 94    Temp 98.2 F (36.8 C) (Oral)    Resp 16    SpO2 100%  Gen:   Awake, no distress   Resp:  Normal effort  MSK:   Moves extremities without difficulty  Other:  Abd soft NTTP  Medical Decision Making  Medically screening exam initiated at 8:11 PM.  Appropriate orders placed.  Coraline Talwar was informed that the remainder of the evaluation will be completed by another provider, this initial triage assessment does not replace that evaluation, and the importance of remaining in the ED until their evaluation is complete.  [address]   Solon Augusta [place], [place] 10/26/21 2014

## 2021-10-26 NOTE — ED Provider Notes (Signed)
[facility]-EMERGENCY DEPT Provider Note   CSN: [id number] Arrival date & time: 10/26/21  1926     History  Chief Complaint  Patient presents with   Vaginitis    Brenda Greer is a 36 y.o. female.  HPI     36yo female presents with concern for vaginal discharge and pain.  Husband interpreting per patient preference  Yesterday began to develop some itching but today developed severe pain, lesions over labia.  Thick white discharge today. No abdominal pain, fever, back pain, nausea, or vomiting.  Has some constipation but had BM yesterday, passing flatus.   No h of hsv in the past Pain is severe, located on outside, has burning when she urinates due to irritation on the outside   Glucose has been ok   Past Medical History:  Diagnosis Date   Diabetes mellitus without complication (HCC)      Home Medications Prior to Admission medications   Medication Sig Start Date End Date Taking? Authorizing Provider  valACYclovir (VALTREX) 1000 MG tablet Take 1 tablet (1,000 mg total) by mouth 2 (two) times daily for 10 days. 10/26/21 11/05/21 Yes Alvira Monday, MD  Blood Glucose Monitoring Suppl (ONETOUCH VERIO) w/Device KIT Use as instructed to check blood sugar three times daily. E11.42 12/17/20   Marcine Matar, MD  ferrous sulfate 325 (65 FE) MG EC tablet TAKE 1 TABLET BY MOUTH ONCE DAILY WITH BREAKFAST 11/26/19   Marcine Matar, MD  gabapentin (NEURONTIN) 300 MG capsule Take 1 capsule (300 mg total) by mouth at bedtime. 03/26/20   Marcine Matar, MD  glucose blood (ONETOUCH VERIO) test strip Use as instructed to check blood sugar 3 times daily. Dx code: E11.42 01/20/21   Marcine Matar, MD  insulin NPH Human (NOVOLIN N) 100 UNIT/ML injection Inject 0.24 mLs (24 Units total) into the skin 2 (two) times daily before a meal. 16 units subcut in a.m and 18 units in p.m 09/21/20   Marcine Matar, MD  Insulin Pen Needle 32G X 4 MM MISC Use once  weekly to inject Ozempic. 10/22/20   Marcine Matar, MD  Insulin Syringe-Needle U-100 (INSULIN SYRINGE 1CC/31GX5/16") 31G X 5/16" 1 ML MISC Use as directed 12/09/18   Marcine Matar, MD  metFORMIN (GLUCOPHAGE-XR) 500 MG 24 hr tablet TAKE 2 TABLETS BY MOUTH TWICE DAILY WITH A MEAL 01/20/21   Marcine Matar, MD  Multiple Vitamin (MULTIVITAMIN WITH MINERALS) TABS Take 1 tablet by mouth daily.    [provider]  OneTouch Delica Lancets 33G MISC Use as instructed to check blood sugar 3 times daily. Dx code: E11.42 12/17/20   Marcine Matar, MD  pantoprazole (PROTONIX) 20 MG tablet Take 1 tablet (20 mg total) by mouth daily. 11/07/18   Marcine Matar, MD  pravastatin (PRAVACHOL) 40 MG tablet Take 1 tablet (40 mg total) by mouth daily. Dose has increased. 01/28/21   Marcine Matar, MD  Semaglutide,0.25 or 0.5MG /DOS, (OZEMPIC, 0.25 OR 0.5 MG/DOSE,) 2 MG/1.5ML SOPN Inject 0.5 mg into the skin once a week. 01/20/21   Marcine Matar, MD  SUMAtriptan (IMITREX) 50 MG tablet take 1 tab at start of Headache.  May repeat in 2 hrs if no relief.  Max 2 tabs/24 hrs. 11/14/19   Marcine Matar, MD  topiramate (TOPAMAX) 25 MG tablet Take 1 tablet (25 mg total) by mouth at bedtime. 07/18/19   Marcine Matar, MD      Allergies  Patient has no known allergies.    Review of Systems   Review of Systems See above  Physical Exam Updated Vital Signs BP 137/79 (BP Location: Left Arm)    Pulse 94    Temp 98.2 F (36.8 C) (Oral)    Resp 16    SpO2 100%  Physical Exam Vitals and nursing note reviewed.  Constitutional:      General: She is not in acute distress.    Appearance: Normal appearance. She is not ill-appearing, toxic-appearing or diaphoretic.  HENT:     Head: Normocephalic.  Eyes:     Conjunctiva/sclera: Conjunctivae normal.  Cardiovascular:     Rate and Rhythm: Normal rate and regular rhythm.     Pulses: Normal pulses.  Pulmonary:     Effort: Pulmonary effort is normal.  No respiratory distress.  Abdominal:     General: Abdomen is flat. There is no distension.     Palpations: Abdomen is soft.     Tenderness: There is no abdominal tenderness. There is no guarding.  Genitourinary:    Comments: Scattered small round painful ulcerations over labia bilaterally White vaginal discharge No CMT/uterine/adenexal tenderness, does feel tenderness labia/introitus on exam Musculoskeletal:        General: No deformity or signs of injury.     Cervical back: No rigidity.  Skin:    General: Skin is warm and dry.     Coloration: Skin is not jaundiced or pale.  Neurological:     General: No focal deficit present.     Mental Status: She is alert and oriented to person, place, and time.    ED Results / Procedures / Treatments   Labs (all labs ordered are listed, but only abnormal results are displayed) Labs Reviewed  URINALYSIS, ROUTINE W REFLEX MICROSCOPIC - Abnormal; Notable for the following components:      Result Value   Color, Urine COLORLESS (*)    Glucose, UA >=500 (*)    All other components within normal limits  WET PREP, GENITAL  PREGNANCY, URINE  GC/CHLAMYDIA PROBE AMP ([place]) NOT AT [facility]    EKG None  Radiology No results found.  Procedures Procedures    Medications Ordered in ED Medications - No data to display  ED Course/ Medical Decision Making/ A&P                           Medical Decision Making   [phone] female presents with concern for vaginal discharge and pain.  Not having abdominal pain, tenderness, low suspicion for acute intraabdominal pathology such as appendicitis, diverticulitis, torsion, PID, TOA. Urine without infectino and pregnancy test negative. UA also without ketones.  No sign of fournier's gangrene or cellulitis.  Wet prep without signs of yeast or infection. GC Chlamydia sent.  Exam consistent with likely HSV with scattered circular ulcerations tenderness.  Given rx for valacyclovir.  Patient discharged in  stable condition with understanding of reasons to return. f        Final Clinical Impression(s) / ED Diagnoses Final diagnoses:  Acute vaginitis  HSV (herpes simplex virus) infection    Rx / DC Orders ED Discharge Orders          Ordered    valACYclovir (VALTREX) 1000 MG tablet  2 times daily        10/26/21 2104              Gareth Morgan, MD 10/27/21 1006

## 2021-10-27 LAB — GC/CHLAMYDIA PROBE AMP (~~LOC~~) NOT AT ARMC
Chlamydia: NEGATIVE
Comment: NEGATIVE
Comment: NORMAL
Neisseria Gonorrhea: NEGATIVE

## 2021-10-29 ENCOUNTER — Emergency Department (HOSPITAL_COMMUNITY)
Admission: EM | Admit: 2021-10-29 | Discharge: 2021-10-29 | Disposition: A | Discharge status: Home / Self Care | Payer: Commercial Managed Care - PPO | Attending: Emergency Medicine | Admitting: Emergency Medicine

## 2021-10-29 ENCOUNTER — Encounter (HOSPITAL_COMMUNITY): Payer: Self-pay | Admitting: Emergency Medicine

## 2021-10-29 DIAGNOSIS — R300 Dysuria: Secondary | ICD-10-CM | POA: Diagnosis present

## 2021-10-29 DIAGNOSIS — N3000 Acute cystitis without hematuria: Secondary | ICD-10-CM | POA: Insufficient documentation

## 2021-10-29 DIAGNOSIS — B009 Herpesviral infection, unspecified: Secondary | ICD-10-CM | POA: Insufficient documentation

## 2021-10-29 LAB — URINALYSIS, ROUTINE W REFLEX MICROSCOPIC
Bilirubin Urine: NEGATIVE
Glucose, UA: >=500 mg/dL — AB
Hgb urine dipstick: NEGATIVE
Ketones, ur: 5 mg/dL — AB
Nitrite: POSITIVE — AB
Protein, ur: NEGATIVE mg/dL
Specific Gravity, Urine: 1.035 — ABNORMAL HIGH (ref 1.005–1.030)
pH: 6 (ref 5.0–8.0)

## 2021-10-29 LAB — PREGNANCY, URINE: Preg Test, Ur: NEGATIVE

## 2021-10-29 MED ORDER — PHENAZOPYRIDINE HCL 200 MG PO TABS: 200.0000 mg | ORAL_TABLET | Freq: Three times a day (TID) | ORAL | 0 refills | Status: AC

## 2021-10-29 MED ORDER — PHENAZOPYRIDINE HCL 100 MG PO TABS: 100.0000 mg | ORAL_TABLET | Freq: Three times a day (TID) | ORAL | Status: DC

## 2021-10-29 MED ORDER — PHENAZOPYRIDINE HCL 100 MG PO TABS
100.0000 mg | ORAL_TABLET | Freq: Once | ORAL | Status: AC
  Administered 2021-10-29: 100 mg via ORAL
  Filled 2021-10-29: qty 1

## 2021-10-29 MED ORDER — CEPHALEXIN 500 MG PO CAPS
500.0000 mg | ORAL_CAPSULE | Freq: Once | ORAL | Status: AC
Start: 2021-10-29 — End: 2021-10-29
  Administered 2021-10-29: 500 mg via ORAL
  Filled 2021-10-29: qty 1

## 2021-10-29 MED ORDER — CEPHALEXIN 500 MG PO CAPS: 500.0000 mg | ORAL_CAPSULE | Freq: Three times a day (TID) | ORAL | 0 refills | Status: AC

## 2021-10-29 NOTE — Discharge Instructions (Addendum)
Today we discussed you likely have herpes as discussed in your previous visit.  Test was not performed to confirm herpes virus however this is a clinical diagnosis and can be diagnosed with performing an exam.  This was done in your case.  You are also tested for other sexually transmitted infections.  Those were negative.  Given your new symptoms today of dysuria and your urine showed concern for UTI.  We will treat you with antibiotics.  I have also sent in medication to the pharmacy for the burning sensation you have wall pain.  This medicine is called Pyridium.  This is to be taken for 2 days.

## 2021-10-29 NOTE — ED Notes (Signed)
Patient has a urine culture in the main lab 

## 2021-10-29 NOTE — ED Provider Notes (Signed)
[facility] DEPT Provider Note   CSN: [id number] Arrival date & time: 10/29/21  0857     History  Chief Complaint  Patient presents with   Vaginal Itching    Brenda Greer is a 36 y.o. female.  36 year old female presents today for evaluation of vaginal itching and dysuria.  Patient was recently evaluated for vaginal itching 2 days ago in this emergency room and discharged on valacyclovir.  Patient reports she reviewed her results on MyChart came to the conclusion that she did not have HSV so she returned for reevaluation.  She reports her symptoms have not worsened but she did develop dysuria today.  She denies fever, chills, vaginal bleeding, increase in vaginal discharge.  She does report compliance with valacyclovir.   The history is provided by the patient. No language interpreter was used.      Home Medications Prior to Admission medications   Medication Sig Start Date End Date Taking? Authorizing Provider  Blood Glucose Monitoring Suppl (ONETOUCH VERIO) w/Device KIT Use as instructed to check blood sugar three times daily. E11.42 12/17/20   Ladell Pier, MD  ferrous sulfate 325 (65 FE) MG EC tablet TAKE 1 TABLET BY MOUTH ONCE DAILY WITH BREAKFAST 11/26/19   Ladell Pier, MD  gabapentin (NEURONTIN) 300 MG capsule Take 1 capsule (300 mg total) by mouth at bedtime. 03/26/20   Ladell Pier, MD  glucose blood (ONETOUCH VERIO) test strip Use as instructed to check blood sugar 3 times daily. Dx code: E11.42 01/20/21   Ladell Pier, MD  insulin NPH Human (NOVOLIN N) 100 UNIT/ML injection Inject 0.24 mLs (24 Units total) into the skin 2 (two) times daily before a meal. 16 units subcut in a.m and 18 units in p.m 09/21/20   Ladell Pier, MD  Insulin Pen Needle 32G X 4 MM MISC Use once weekly to inject Ozempic. 10/22/20   Ladell Pier, MD  Insulin Syringe-Needle U-100 (INSULIN SYRINGE 1CC/31GX5/16") 31G X 5/16" 1 ML MISC  Use as directed 12/09/18   Ladell Pier, MD  metFORMIN (GLUCOPHAGE-XR) 500 MG 24 hr tablet TAKE 2 TABLETS BY MOUTH TWICE DAILY WITH A MEAL 01/20/21   Ladell Pier, MD  Multiple Vitamin (MULTIVITAMIN WITH MINERALS) TABS Take 1 tablet by mouth daily.    [provider]  OneTouch Delica Lancets 58K MISC Use as instructed to check blood sugar 3 times daily. Dx code: E11.42 12/17/20   Ladell Pier, MD  pantoprazole (PROTONIX) 20 MG tablet Take 1 tablet (20 mg total) by mouth daily. 11/07/18   Ladell Pier, MD  pravastatin (PRAVACHOL) 40 MG tablet Take 1 tablet (40 mg total) by mouth daily. Dose has increased. 01/28/21   Ladell Pier, MD  Semaglutide,0.25 or 0.5MG /DOS, (OZEMPIC, 0.25 OR 0.5 MG/DOSE,) 2 MG/1.5ML SOPN Inject 0.5 mg into the skin once a week. 01/20/21   Ladell Pier, MD  SUMAtriptan (IMITREX) 50 MG tablet take 1 tab at start of Headache.  May repeat in 2 hrs if no relief.  Max 2 tabs/24 hrs. 11/14/19   Ladell Pier, MD  topiramate (TOPAMAX) 25 MG tablet Take 1 tablet (25 mg total) by mouth at bedtime. 07/18/19   Ladell Pier, MD  valACYclovir (VALTREX) 1000 MG tablet Take 1 tablet (1,000 mg total) by mouth 2 (two) times daily for 10 days. 10/26/21 11/05/21  Gareth Morgan, MD      Allergies    Patient has no known allergies.  Review of Systems   Review of Systems  Constitutional:  Negative for chills and fever.  Gastrointestinal:  Negative for abdominal pain, nausea and vomiting.  Genitourinary:  Positive for dysuria, vaginal discharge and vaginal pain. Negative for difficulty urinating, flank pain, hematuria, pelvic pain and vaginal bleeding.  Neurological:  Negative for weakness.  All other systems reviewed and are negative.  Physical Exam Updated Vital Signs BP 125/82 (BP Location: Left Arm)    Pulse 97    Temp 97.8 F (36.6 C) (Oral)    Resp 16    SpO2 97%  Physical Exam Vitals and nursing note reviewed.  Constitutional:       General: She is not in acute distress.    Appearance: Normal appearance. She is not ill-appearing.  HENT:     Head: Normocephalic and atraumatic.     Nose: Nose normal.  Eyes:     General: No scleral icterus.    Extraocular Movements: Extraocular movements intact.     Conjunctiva/sclera: Conjunctivae normal.  Cardiovascular:     Rate and Rhythm: Normal rate and regular rhythm.     Pulses: Normal pulses.     Heart sounds: Normal heart sounds.  Pulmonary:     Effort: Pulmonary effort is normal. No respiratory distress.     Breath sounds: Normal breath sounds. No wheezing or rales.  Abdominal:     General: There is no distension.     Tenderness: There is no abdominal tenderness.  Musculoskeletal:        General: Normal range of motion.     Cervical back: Normal range of motion.  Skin:    General: Skin is warm and dry.  Neurological:     General: No focal deficit present.     Mental Status: She is alert. Mental status is at baseline.    ED Results / Procedures / Treatments   Labs (all labs ordered are listed, but only abnormal results are displayed) Labs Reviewed  URINALYSIS, ROUTINE W REFLEX MICROSCOPIC - Abnormal; Notable for the following components:      Result Value   Color, Urine AMBER (*)    Specific Gravity, Urine 1.035 (*)    Glucose, UA >=500 (*)    Ketones, ur 5 (*)    Nitrite POSITIVE (*)    Leukocytes,Ua SMALL (*)    Bacteria, UA FEW (*)    All other components within normal limits  PREGNANCY, URINE    EKG None  Radiology No results found.  Procedures Procedures    Medications Ordered in ED Medications - No data to display  ED Course/ Medical Decision Making/ A&P                           Medical Decision Making  Medical Decision Making / ED Course   This patient presents to the ED for concern of vaginal itching, and dysuria, this involves an extensive number of treatment options, and is a complaint that carries with it a high risk of  complications and morbidity.  The differential diagnosis includes STI, HSV, UTI  MDM: 36 year old female presents today for evaluation of vaginal itching.  Patient was evaluated in this emergency room 2 days ago and diagnosed with HSV and started on valacyclovir.  Patient misunderstood lab results and we presented to the emergency room.  Patient does however have new dysuria.  UA shows UTI.  Patient without abdominal pain, flank pain, fever thus less concern for pyelonephritis.  Will  start patient on treatment for UTI with Keflex.  Discussed expected course of HSV.  Return precautions discussed.  Discussed importance of follow-up with primary care provider.  Discussed repeat exam but given exam performed 2 days ago and no change in symptoms patient defers.  I feel this is reasonable.   Additional history obtained: -Additional history obtained from recent emergency room visit on 1/11. -External records from outside source obtained and reviewed including: Chart review including previous notes, labs, imaging, consultation notes   Lab Tests: -I ordered, reviewed, and interpreted labs.   The pertinent results include:   Labs Reviewed  URINALYSIS, ROUTINE W REFLEX MICROSCOPIC - Abnormal; Notable for the following components:      Result Value   Color, Urine AMBER (*)    Specific Gravity, Urine 1.035 (*)    Glucose, UA >=500 (*)    Ketones, ur 5 (*)    Nitrite POSITIVE (*)    Leukocytes,Ua SMALL (*)    Bacteria, UA FEW (*)    All other components within normal limits  PREGNANCY, URINE      EKG  EKG Interpretation  Date/Time:    Ventricular Rate:    PR Interval:    QRS Duration:   QT Interval:    QTC Calculation:   R Axis:     Text Interpretation:           Imaging Studies ordered: N/A   Medicines ordered and prescription drug management: No orders of the defined types were placed in this encounter.   -I have reviewed the patients home medicines and have made adjustments  as needed  Reevaluation: After the interventions noted above, I reevaluated the patient and found that they have :stayed the same  Co morbidities that complicate the patient evaluation  Past Medical History:  Diagnosis Date   Diabetes mellitus without complication ([place])      Dispostion: Patient is appropriate for discharge.  Will start Keflex for UTI.  Return precautions discussed.  Discussed when to follow-up with her PCP.  Final Clinical Impression(s) / ED Diagnoses Final diagnoses:  HSV (herpes simplex virus) infection  Acute cystitis without hematuria    Rx / DC Orders ED Discharge Orders          Ordered    cephALEXin (KEFLEX) 500 MG capsule  3 times daily        10/29/21 1640    phenazopyridine (PYRIDIUM) 200 MG tablet  3 times daily with meals        10/29/21 1640              Evlyn Courier, PA-C 10/29/21 1643    Charlesetta Shanks, MD 10/29/21 1827

## 2021-10-29 NOTE — ED Provider Triage Note (Signed)
Emergency Medicine Provider Triage Evaluation Note  Brenda Greer , a 36 y.o. female  was evaluated in triage.  Pt complains of vaginal itching and pain of 2-day duration.  Patient was evaluated here Thursday.  Denies any change in symptoms with the exception of new dysuria that started today.  Patient reports her daughter reviewed her MyChart results and interpreted them as she was negative for HSV so she wanted to be reevaluated.  Discussed with her her results and that she was not tested for HSV.  However given that she has new dysuria will check UA.  She denies any vaginal discharge, swelling, or other symptoms at the site.  She is without abdominal pain.  Review of Systems  Positive: As above Negative: As above  Physical Exam  BP (!) 142/90    Pulse (!) 109    Temp 97.8 F (36.6 C) (Oral)    Resp 16    SpO2 100%  Gen:   Awake, no distress   Resp:  Normal effort  MSK:   Moves extremities without difficulty  Other:    Medical Decision Making  Medically screening exam initiated at 9:38 AM.  Appropriate orders placed.  Deshaun Schou was informed that the remainder of the evaluation will be completed by another provider, this initial triage assessment does not replace that evaluation, and the importance of remaining in the ED until their evaluation is complete.     Marita Kansas, PA-C 10/29/21 [phone]

## 2021-10-29 NOTE — ED Triage Notes (Addendum)
Patient c/o vaginal itching. Seen for same on 1/11 dx with vaginitis. Reports small amount of yellow vaginal discharge and dysuria. Hx herpes.  Triage complete using interpreter [phone]

## 2021-11-18 ENCOUNTER — Ambulatory Visit: Payer: Commercial Managed Care - PPO | Attending: Internal Medicine | Admitting: Internal Medicine

## 2021-11-18 ENCOUNTER — Encounter: Payer: Self-pay | Admitting: Internal Medicine

## 2021-11-18 ENCOUNTER — Other Ambulatory Visit: Payer: Self-pay

## 2021-11-18 VITALS — BP 126/89 | HR 111 | Temp 98.4°F | Resp 16 | Wt 147.0 lb

## 2021-11-18 DIAGNOSIS — Z114 Encounter for screening for human immunodeficiency virus [HIV]: Secondary | ICD-10-CM

## 2021-11-18 DIAGNOSIS — E1165 Type 2 diabetes mellitus with hyperglycemia: Secondary | ICD-10-CM

## 2021-11-18 DIAGNOSIS — E785 Hyperlipidemia, unspecified: Secondary | ICD-10-CM

## 2021-11-18 DIAGNOSIS — Z91199 Patient's noncompliance with other medical treatment and regimen due to unspecified reason: Secondary | ICD-10-CM

## 2021-11-18 DIAGNOSIS — E1169 Type 2 diabetes mellitus with other specified complication: Secondary | ICD-10-CM | POA: Diagnosis not present

## 2021-11-18 DIAGNOSIS — Z1159 Encounter for screening for other viral diseases: Secondary | ICD-10-CM

## 2021-11-18 DIAGNOSIS — Z794 Long term (current) use of insulin: Secondary | ICD-10-CM

## 2021-11-18 DIAGNOSIS — Z8619 Personal history of other infectious and parasitic diseases: Secondary | ICD-10-CM

## 2021-11-18 DIAGNOSIS — Z23 Encounter for immunization: Secondary | ICD-10-CM

## 2021-11-18 LAB — POCT GLYCOSYLATED HEMOGLOBIN (HGB A1C): HbA1c, POC (controlled diabetic range): 13.2 % — AB (ref 0.0–7.0)

## 2021-11-18 LAB — GLUCOSE, POCT (MANUAL RESULT ENTRY): POC Glucose: 292 mg/dL — AB (ref 70–99)

## 2021-11-18 MED ORDER — INSULIN NPH (HUMAN) (ISOPHANE) 100 UNIT/ML ~~LOC~~ SUSP: 20.0000 [IU] | Freq: Two times a day (BID) | SUBCUTANEOUS | 11 refills | Status: DC

## 2021-11-18 MED ORDER — ONETOUCH VERIO VI STRP: ORAL_STRIP | 12 refills | Status: DC

## 2021-11-18 MED ORDER — ONETOUCH DELICA LANCETS 33G MISC: 6 refills | Status: DC

## 2021-11-18 MED ORDER — OZEMPIC (0.25 OR 0.5 MG/DOSE) 2 MG/1.5ML ~~LOC~~ SOPN: 0.2500 mg | PEN_INJECTOR | SUBCUTANEOUS | 2 refills | Status: DC

## 2021-11-18 MED ORDER — ONETOUCH VERIO W/DEVICE KIT: PACK | 0 refills | Status: AC

## 2021-11-18 MED ORDER — ACCU-CHEK GUIDE VI STRP: ORAL_STRIP | 12 refills | Status: DC

## 2021-11-18 MED ORDER — ACCU-CHEK GUIDE W/DEVICE KIT: PACK | 0 refills | Status: DC

## 2021-11-18 MED ORDER — ACCU-CHEK SOFTCLIX LANCETS MISC
12 refills | Status: DC
Start: 2021-11-18 — End: 2022-06-13

## 2021-11-18 MED ORDER — METFORMIN HCL ER 500 MG PO TB24: ORAL_TABLET | ORAL | 2 refills | Status: DC

## 2021-11-18 MED ORDER — "INSULIN SYRINGE 31G X 5/16"" 1 ML MISC": 6 refills | Status: DC

## 2021-11-18 NOTE — Patient Instructions (Signed)
Diabetes mellitus y nutrición, en adultos °Diabetes Mellitus and Nutrition, Adult °Si sufre de diabetes, o diabetes mellitus, es muy importante tener hábitos alimenticios saludables debido a que sus niveles de azúcar en la sangre (glucosa) se ven afectados en gran medida por lo que come y bebe. [place] alimentos saludables en las cantidades correctas, aproximadamente a la misma hora todos los días, lo ayudará a: °Controlar su glucemia. °Disminuir el riesgo de sufrir una enfermedad cardíaca. °Mejorar la presión arterial. °Alcanzar o mantener un peso saludable. °¿Qué puede afectar mi plan de alimentación? °Todas las personas que sufren de diabetes son diferentes y cada una tiene necesidades diferentes en cuanto a un plan de alimentación. El médico puede recomendarle que trabaje con un nutricionista para elaborar el mejor plan para usted. Su plan de alimentación puede variar según factores como: °Las calorías que necesita. °Los medicamentos que toma. °Su peso. °Sus niveles de glucemia, presión arterial y colesterol. °Su nivel de actividad. °Otras afecciones que tenga, como enfermedades cardíacas o renales. °¿Cómo me afectan los carbohidratos? °Los carbohidratos, o hidratos de carbono, afectan su nivel de glucemia más que cualquier otro tipo de alimento. La ingesta de carbohidratos aumenta la cantidad de glucosa en la sangre. °Es importante conocer la cantidad de carbohidratos que se pueden ingerir en cada comida sin correr ningún riesgo. Esto es diferente en cada persona. Su nutricionista puede ayudarlo a calcular la cantidad de carbohidratos que debe ingerir en cada comida y en cada refrigerio. °¿Cómo me afecta el alcohol? °El alcohol puede provocar una disminución de la glucemia (hipoglucemia), especialmente si usa insulina o toma determinados medicamentos por vía oral para la diabetes. La hipoglucemia es una afección potencialmente mortal. Los síntomas de la hipoglucemia, como somnolencia, mareos y confusión, son  similares a los síntomas de haber consumido demasiado alcohol. °No beba alcohol si: °Su médico le indica no hacerlo. °Está embarazada, puede estar embarazada o está tratando de quedar embarazada. °Si bebe alcohol: °Limite la cantidad que bebe a lo siguiente: °De 0 a 1 medida por día para las mujeres. °De 0 a 2 medidas por día para los hombres. °Sepa cuánta cantidad de alcohol hay en las bebidas que toma. En los [place], una medida equivale a una botella de cerveza de 12 oz (355 ml), un vaso de vino de 5 oz (148 ml) o un vaso de una bebida alcohólica de alta graduación de 1½ oz (44 ml). °Manténgase hidratado bebiendo agua, refrescos dietéticos o té helado sin azúcar. Tenga en cuenta que los refrescos comunes, los jugos y otras bebidas para mezclar pueden contener mucha azúcar y se deben contar como carbohidratos. °Consejos para seguir este plan °Leer las etiquetas de los alimentos °Comience por leer el tamaño de la porción en la etiqueta de Información nutricional de los alimentos envasados y las bebidas. La cantidad de calorías, carbohidratos, grasas y otros nutrientes detallados en la etiqueta se basan en una porción del alimento. Muchos alimentos contienen más de una porción por envase. °Verifique la cantidad total de gramos (g) de carbohidratos totales en una porción. °Verifique la cantidad de gramos de grasas saturadas y grasas trans en una porción. Escoja alimentos que no contengan estas grasas o que su contenido de estas sea bajo. °Verifique la cantidad de miligramos (mg) de sal (sodio) en una porción. La mayoría de las personas deben limitar la ingesta de sodio total a menos de 2300 mg por día. °Siempre consulte la información nutricional de los alimentos etiquetados como “con bajo contenido de grasa” o “sin grasa”. Estos   alimentos pueden tener un mayor contenido de Location manager agregada o carbohidratos refinados, y deben evitarse. Hable con su nutricionista para identificar sus objetivos diarios en cuanto  a los nutrientes mencionados en la etiqueta. Al ir de compras Evite comprar alimentos procesados, enlatados o precocidos. Estos alimentos tienden a Special educational needs teacher mayor cantidad de [place], sodio y azcar agregada. Compre en la zona exterior de la tienda de comestibles. Esta es la zona donde se encuentran con mayor frecuencia las frutas y las verduras frescas, los cereales a granel, las carnes frescas y los productos lcteos frescos. Al cocinar Use mtodos de coccin a baja temperatura, como hornear, en lugar de mtodos de coccin a alta temperatura, como frer en abundante aceite. Cocine con aceites saludables, como el aceite de [place], canola o [place]. Evite cocinar con manteca, crema o carnes con alto contenido de grasa. Planificacin de las comidas Coma las comidas y los refrigerios regularmente, preferentemente a la misma hora todos [place]. Evite pasar largos perodos de tiempo sin comer. Consuma alimentos ricos en fibra, como frutas frescas, verduras, frijoles y cereales integrales. Consuma entre 4 y 6 onzas (entre 112 y 168 g) de protenas magras por da, como carnes [place], pollo, pescado, huevos o tofu. Una onza (oz) (28 g) de protena magra equivale a: 1 onza (28 g) de carne, pollo o pescado. 1 huevo.  taza (62 g) de tofu. Coma algunos alimentos por da que contengan grasas saludables, como aguacates, frutos secos, semillas y pescado. Qu alimentos debo comer? Lambert Mody Bayas. Manzanas. Naranjas. Duraznos. Damascos. Ciruelas. Uvas. Mangos. Papayas. Granadas. Kiwi. Cerezas. Verduras Verduras de [employer], que incluyen [place], [place], col rizada, acelga, hojas de berza, hojas de mostaza y repollo. Remolachas. Coliflor. Brcoli. Zanahorias. Judas verdes. Tomates. Pimientos. Cebollas. Pepinos. Coles de Bruselas. Granos Granos integrales, como panes, galletas, tortillas, cereales y pastas de salvado o integrales. Avena sin azcar. Quinua. Arroz integral o salvaje. Carnes y otras  protenas Frutos de mar. Carne de ave sin piel. Cortes magros de ave y carne de res. Tofu. Frutos secos. Semillas. Lcteos Productos lcteos sin grasa o con bajo contenido de [place], [place]anda, yogur y [place]. Es posible que los productos detallados arriba no constituyan una lista completa de los alimentos y las bebidas que puede tomar. Consulte a un nutricionista para obtener ms informacin. Qu alimentos debo evitar? Lambert Mody Frutas enlatadas al almbar. Verduras Verduras enlatadas. Verduras congeladas con mantequilla o salsa de crema. Granos Productos elaborados con [place] y [place], como panes, pastas, bocadillos y cereales. Evite todos los alimentos procesados. Carnes y otras protenas Cortes de carne con alto contenido de Lobbyist. Carne de ave con piel. Carnes empanizadas o fritas. Carne procesada. Evite las grasas saturadas. Lcteos Yogur, [place] enteros. Bebidas Bebidas azucaradas, como gaseosas o t helado. Es posible que los productos que se enumeran ms [place] no constituyan una lista completa de los alimentos y las bebidas que Nurse, adult. Consulte a un nutricionista para obtener ms informacin. Preguntas para hacerle al mdico Debo consultar con un especialista certificado en atencin y educacin sobre la diabetes? Es necesario que me rena con un nutricionista? A qu nmero puedo llamar si tengo preguntas? Cules son los mejores momentos para controlar la glucemia? Dnde encontrar ms informacin: American Diabetes Association (Asociacin Estadounidense de la Diabetes): diabetes.org Academy of Nutrition and Dietetics (Academia de Nutricin y Information systems manager): eatright.Unisys Corporation of Diabetes and Digestive and Kidney Diseases ([place] la Diabetes y [place] y Renales): AmenCredit.is Association of Diabetes Care &  Education Specialists (Asociacin de Especialistas en Atencin y Educacin sobre la Diabetes):  diabeteseducator.org Resumen Es importante tener hbitos alimenticios saludables debido a que sus niveles de Psychologist, counselling sangre (glucosa) se ven afectados en gran medida por lo que come y bebe. Es importante consumir alcohol con prudencia. Un plan de comidas saludable lo ayudar a controlar la glucosa en sangre y a reducir el riesgo de enfermedades cardacas. El mdico puede recomendarle que trabaje con un nutricionista para elaborar el mejor plan para usted. Esta informacin no tiene Theme park manager el consejo del mdico. Asegrese de hacerle al mdico cualquier pregunta que tenga.  Herpes genital Genital Herpes El herpes genital es una infeccin de transmisin sexual (ITS) frecuente causada por un virus. El virus se propaga de Neomia Dear persona a otra a travs del contacto sexual. La infeccin puede causar picazn, ampollas y llagas en los genitales o en el recto. Los sntomas pueden durar [address] y Clinical biochemist. Esto se denomina erupcin. Sin embargo, el virus [employer] cuerpo, de modo que es posible tener ms erupciones en el futuro. El [employer] las erupciones vara: pueden transcurrir de meses a aos. El herpes genital puede afectar a Emergency planning/management officer. Es particularmente preocupante para las embarazadas porque el virus puede transmitirse al beb durante el parto y causar problemas graves. El herpes genital tambin es un motivo de preocupacin para las personas que tienen debilitado el sistema encargado de combatir las enfermedades (sistema inmunitario). Cules son las causas? Esta afeccin es causada por el herpesvirus humano, tambin llamado virus del herpes simple (VHS), tipo 1 o tipo 2 (VHS-1 o VHS-2). El virus puede transmitirse a travs de lo siguiente: El contacto sexual con una persona infectada, incluido el sexo vaginal, anal y oral. El contacto con el lquido de una llaga de herpes. La piel. Esto significa que puede contraer herpes de una pareja infectada incluso si  no hay ampollas ni llagas presentes. Es posible que su pareja no sepa que tiene la infeccin. Qu incrementa el riesgo? Es ms probable que sufra esta afeccin si: Tiene relaciones sexuales con muchas parejas. No [place] preservativos de ltex cuando tiene Clinical research associate. Cules son los signos o sntomas? La mayora de las personas no presentan sntomas (son asintomticas) o tienen sntomas leves que pueden confundirse con otros problemas de la piel. Entre los sntomas, se pueden incluir los siguientes: Pequeos bultos rojos cerca de los genitales, del recto o de la boca. Estos bultos se convierten en ampollas y Clinical cytogeneticist. Sntomas similares a los de la gripe, como: [place]. Dolores [employer] cuerpo. Ganglios linfticos hinchados. Dolor de [place]. Dolor al Beatrix Shipper. Dolor y picazn en la zona genital o en el rea rectal. Secrecin vaginal. Hormigueo o dolor punzante en las piernas y en las nalgas. Por lo general, los sntomas son ms intensos y duran ms durante la primera erupcin (primaria). Los sntomas similares a los de la gripe tambin son ms frecuentes durante la erupcin primaria. Cmo se diagnostica? Esta afeccin se puede diagnosticar en funcin de lo siguiente: Un examen fsico. Sus antecedentes mdicos. Anlisis de [place]. Anlisis de [place] del lquido (cultivo) de Heritage manager. Cmo se trata? No existe ninguna cura para esta afeccin, pero el tratamiento con medicamentos antivirales que se toman por la boca (por va oral) puede lograr lo siguiente: Camera operator recuperacin y [employer] sntomas. Ayudar a reducir Interior and spatial designer del virus a las Chief Strategy Officer. Limitar las probabilidades de futuras erupciones o reducir su duracin.  Aliviar los sntomas de futuras erupciones. El mdico tambin puede recomendarle medicamentos para Engineer, materials (analgsicos), como aspirina o ibuprofeno. Siga estas indicaciones en casa: Si tiene una erupcin: Mantenga las  zonas afectadas secas y limpias. Evite frotar o tocar las ampollas y las llagas. Si toca las ampollas o llagas: [employer] las manos con agua y [place]. No se toque los ojos despus de ello. Para aliviar el dolor o la picazn, puede tomar las siguientes medidas segn las indicaciones del mdico: Aplique un pao hmedo y fro (compresa fra) en las zonas afectadas de 4 a 6 veces por Futures trader. Aplique una sustancia que protege la piel y reduce el sangrado (astringente). Aplique un gel que ayuda a Engineer, materials cerca de las llagas (gel con lidocana). Dese un bao tibio de poca profundidad para limpiar la zona genital (bao de asiento). Actividad sexual No tenga contacto sexual durante erupciones activas. Practique sexo seguro. El herpes puede propagarse incluso si su pareja no tiene ampollas ni llagas. Los preservativos de ltex y los preservativos femeninos pueden ayudar a Multimedia programmer contagio del virus del herpes. Indicaciones generales Use los medicamentos de venta libre y los recetados solamente como se lo haya indicado el mdico. Oceanographer a todas las visitas de seguimiento como se lo haya indicado el mdico. Esto es importante. Cmo se previene? Use preservativos. Aunque puede contraer herpes genital durante el contacto sexual, incluso con el uso de un preservativo, el preservativo puede brindar cierta proteccin. Evite tener mltiples parejas sexuales. Hable con su pareja sexual acerca de los sntomas que cualquiera de [address]. [place], hable con su pareja acerca de cualquier antecedente de ITS que pudiera tener. Hgase pruebas para detectar ITS antes de Management consultant. [place]dale a su pareja que haga lo mismo. No tenga contacto sexual si tiene sntomas activos de herpes genital. Comunquese con un mdico si: Los sntomas no mejoran con los medicamentos. Los sntomas regresan o tiene sntomas nuevos. Tiene fiebre. Siente dolor abdominal. Presenta enrojecimiento,  hinchazn o dolor en el ojo. Nota llagas nuevas en otras partes del cuerpo. Es mujer y tiene sangrado entre perodos [place]. Tuvo herpes y [place] o est pensando en quedar embarazada. Resumen El herpes genital es una infeccin de transmisin sexual (ITS) frecuente causada por el virus del herpes simple tipo 1 o tipo 2 (VHS-1 o VHS-2). Estos virus casi siempre se transmiten a travs del contacto sexual con Neomia Dear persona infectada. Es ms probable que Conservator, museum/gallery afeccin si tiene relaciones sexuales con muchas parejas o si tiene relaciones sexuales sin usar preservativo. La mayora de las personas no presentan sntomas (son asintomticas) o tienen sntomas leves que pueden confundirse con otros problemas de la piel. Los sntomas aparecen a medida que se presentan las erupciones con meses o aos de separacin entre Neomia Dear y [place]. No existe ninguna cura para esta afeccin, pero el tratamiento con medicamentos antivirales por va oral puede [employer], reducir las probabilidades de Engineering geologist el virus a una pareja, evitar futuras erupciones o disminuir la duracin de futuras erupciones. Esta informacin no tiene Theme park manager el consejo del mdico. Asegrese de hacerle al mdico cualquier pregunta que tenga. Document Revised: 09/04/2019 Document Reviewed: 09/04/2019 Elsevier Patient Education  2022 Elsevier Inc.  Document Revised: 06/09/2020 Document Reviewed: 06/09/2020 Elsevier Patient Education  2022 [employer].

## 2021-11-18 NOTE — Progress Notes (Signed)
Not taking any medications F/u DM

## 2021-11-18 NOTE — Progress Notes (Signed)
Patient ID: Brenda Greer, female    DOB: 11-Jun-1986  MRN: [id number]  CC: Diabetes and Medication Refill   Subjective: Brenda Greer is a 36 y.o. female who presents for chronic ds management Her concerns today include:  Pt has DM, HL, GERD,  Iron def anemia, migraines without aura.  DIABETES TYPE 2 Last A1C:   Results for orders placed or performed in visit on 11/18/21  POCT glucose (manual entry)  Result Value Ref Range   POC Glucose 292 (A) 70 - 99 mg/dl  POCT glycosylated hemoglobin (Hb A1C)  Result Value Ref Range   Hemoglobin A1C     HbA1c POC (<> result, manual entry)     HbA1c, POC (prediabetic range)     HbA1c, POC (controlled diabetic range) 13.2 (A) 0.0 - 7.0 %   Last A1C was 9.9 Med Adherence:  $RemoveBef'[]'kIvdiYQpna$  Yes    '[x]'$  No - was suppose to be on NPH insulin 24 units BID, Ozempic 0.5 mg and Metformin 1 BID.  "I stopped coming here and I stopped taking them.  I've been dumb." Off meds x several mths.  Still has [facility] insurance Home Monitoring?  $RemoveBefo'[]'IbOsBfWaRXq$  Yes    '[x]'$  No - no device.  Sent rxn 12/2020 but she never got it Home glucose results range: Diet Adherence: $RemoveBeforeD'[]'aQgvPJzpeeFgOJ$  Yes    '[x]'$  No Exercise: $RemoveBefo'[x]'pLZNPXlmopA$  Yes -  Walks 1 hr 3x/wk    '[]'$  No Numbness of the feet? $RemoveB'[]'CByEjxLv$  Yes    '[x]'$  No Retinopathy hx? $RemoveBeforeD'[]'VwzIEPugzHmvpJ$  Yes    '[]'$  No Last eye exam: no blurred vision; over due for eye exam Comments: endorses polydipsia/uria.  Wgh down 4 lbs   HL:  off Pravachol for a while also for the same reason that she was off her diabetic medications.  Seen in ER 10/26/21 for vaginal dischg and painful lesions on labia.  Diag with probable herpes based on clinical exam.  Given Valtrex; pain resolved with med.  Wet prep and GC/chlamydia negative.    HM: agree for HIV/hep C screening.  Due for flu and Tdapt vaccines.   Patient Active Problem List   Diagnosis Date Noted   Microalbuminuria due to type 2 diabetes mellitus ([place]) 01/22/2021   COVID-19 vaccine series completed 09/21/2020   Uncontrolled type 2 diabetes  mellitus with peripheral neuropathy 07/18/2019   Migraine without aura and without status migrainosus, not intractable 07/18/2019   Hyperlipidemia associated with type 2 diabetes mellitus ([place]) 07/18/2019   Iron deficiency anemia due to chronic blood loss 07/02/2018   Hyperlipidemia 02/26/2018   Gastroesophageal reflux disease without esophagitis 02/26/2018   Diabetes mellitus without complication ([place]) 81/10/7508   Type II or unspecified type diabetes mellitus without mention of complication, uncontrolled 05/19/2013   OBESITY, NOS 12/13/2006     Current Outpatient Medications on File Prior to Visit  Medication Sig Dispense Refill   pravastatin (PRAVACHOL) 40 MG tablet Take 1 tablet (40 mg total) by mouth daily. Dose has increased. (Patient not taking: Reported on 11/18/2021) 90 tablet 1   No current facility-administered medications on file prior to visit.    No Known Allergies  Social History   Socioeconomic History   Marital status: Single    Spouse name: Not on file   Number of children: Not on file   Years of education: Not on file   Highest education level: Not on file  Occupational History   Not on file  Tobacco Use   Smoking status: Never   Smokeless tobacco: Never  Substance and Sexual Activity   Alcohol use: No   Drug use: No   Sexual activity: Not on file  Other Topics Concern   Not on file  Social History Narrative   Not on file   Social Determinants of Health   Financial Resource Strain: Not on file  Food Insecurity: Not on file  Transportation Needs: Not on file  Physical Activity: Not on file  Stress: Not on file  Social Connections: Not on file  Intimate Partner Violence: Not on file    Family History  Problem Relation Age of Onset   Diabetes Mother    Hypertension Mother     No past surgical history on file.  ROS: Review of Systems Negative except as stated above  PHYSICAL EXAM: BP 126/89 (BP Location: Left Arm, Patient Position:  Sitting, Cuff Size: Normal)    Pulse (!) 111    Temp 98.4 F (36.9 C) (Oral)    Resp 16    Wt 147 lb (66.7 kg)    LMP 11/12/2021    SpO2 100%    BMI 26.89 kg/m   Wt Readings from Last 3 Encounters:  11/18/21 147 lb (66.7 kg)  01/20/21 151 lb 3.2 oz (68.6 kg)  09/21/20 157 lb 9.6 oz (71.5 kg)    Physical Exam  General appearance - alert, well appearing, and in no distress Mental status - normal mood, behavior, speech, dress, motor activity, and thought processes Mouth - mucous membranes moist, pharynx normal without lesions Neck - supple, no significant adenopathy Chest - clear to auscultation, no wheezes, rales or rhonchi, symmetric air entry Heart - normal rate, regular rhythm, normal S1, S2, no murmurs, rubs, clicks or gallops Extremities - peripheral pulses normal, no pedal edema, no clubbing or cyanosis Diabetic Foot Exam - Simple   Simple Foot Form Visual Inspection See comments: Yes Sensation Testing Intact to touch and monofilament testing bilaterally: Yes Pulse Check Posterior Tibialis and Dorsalis pulse intact bilaterally: Yes Comments Toenails are thick and discolored.      CMP Latest Ref Rng & Units 01/20/2021 11/25/2019 11/07/2018  Glucose 65 - 99 mg/dL 957(F) 009(U) 004(H)  BUN 6 - 20 mg/dL 5(L) 8 8  Creatinine 5.93 - 1.00 mg/dL 0.12(F) 7.99(K) 9.40(C)  Sodium 134 - 144 mmol/L 138 138 137  Potassium 3.5 - 5.2 mmol/L 3.8 3.9 4.4  Chloride 96 - 106 mmol/L 102 103 102  CO2 20 - 29 mmol/L 22 19(L) 20  Calcium 8.7 - 10.2 mg/dL 9.5 9.0 9.6  Total Protein 6.0 - 8.5 g/dL 7.0 6.7 7.3  Total Bilirubin 0.0 - 1.2 mg/dL <0.5 0.3 0.4  Alkaline Phos 44 - 121 IU/L 71 82 72  AST 0 - 40 IU/L 23 41(H) 34  ALT 0 - 32 IU/L 17 43(H) 29   Lipid Panel     Component Value Date/Time   CHOL 225 (H) 01/20/2021 1616   TRIG 87 01/20/2021 1616   HDL 63 01/20/2021 1616   CHOLHDL 3.6 01/20/2021 1616   CHOLHDL 4.2 05/19/2013 1703   VLDL 38 05/19/2013 1703   LDLCALC 147 (H) 01/20/2021  1616    CBC    Component Value Date/Time   WBC 7.9 01/20/2021 1616   WBC 9.4 04/06/2013 2035   RBC 4.54 01/20/2021 1616   RBC 4.10 04/06/2013 2035   HGB 12.0 01/20/2021 1616   HCT 37.6 01/20/2021 1616   PLT 215 01/20/2021 1616   MCV 83 01/20/2021 1616   MCH 26.4 (L) 01/20/2021 1616  MCH 24.9 (L) 04/06/2013 2035   MCHC 31.9 01/20/2021 1616   MCHC 32.3 04/06/2013 2035   RDW 14.5 01/20/2021 1616   LYMPHSABS 2.9 04/06/2013 2035   MONOABS 0.4 04/06/2013 2035   EOSABS 0.1 04/06/2013 2035   BASOSABS 0.0 04/06/2013 2035    ASSESSMENT AND PLAN: 1. Type 2 diabetes mellitus with hyperglycemia, with long-term current use of insulin (HCC) Uncontrolled due to being noncompliant with medications. Discussed the importance of healthy eating habits, regular aerobic exercise (at least 150 minutes a week as tolerated) and medication compliance to achieve or maintain control of diabetes and prevent complications. She is agreeable to getting back on her medicines.  Refill sent on metformin, NPH insulin at 20 units twice a day.  I have decreased the dose of Ozempic to 0.25 mg since she has been off of it for a while. Prescription sent to her pharmacy for diabetic testing supplies.  Advised to check blood sugars at least twice a day before breakfast and before dinner.  Record her readings.  Request that she follow-up with the clinical pharmacist in 2 weeks with her readings. - POCT glucose (manual entry) - POCT glycosylated hemoglobin (Hb A1C) - Microalbumin / creatinine urine ratio - CBC - Comprehensive metabolic panel - Ambulatory referral to Ophthalmology - glucose blood (ONETOUCH VERIO) test strip; Use as instructed to check blood sugar 3 times daily. Dx code: E11.42  Dispense: 100 each; Refill: 12 - Blood Glucose Monitoring Suppl (ONETOUCH VERIO) w/Device KIT; Use as instructed to check blood sugar three times daily. E11.42  Dispense: 1 kit; Refill: 0 - OneTouch Delica Lancets 09N MISC; Use as  instructed to check blood sugar 3 times daily. Dx code: E11.42  Dispense: 100 each; Refill: 6 - metFORMIN (GLUCOPHAGE-XR) 500 MG 24 hr tablet; TAKE 2 TABLETS BY MOUTH TWICE DAILY WITH A MEAL  Dispense: 360 tablet; Refill: 2 - insulin NPH Human (NOVOLIN N) 100 UNIT/ML injection; Inject 0.2 mLs (20 Units total) into the skin 2 (two) times daily before a meal.  Dispense: 10 mL; Refill: 11 - Semaglutide,0.25 or 0.5MG /DOS, (OZEMPIC, 0.25 OR 0.5 MG/DOSE,) 2 MG/1.5ML SOPN; Inject 0.25 mg into the skin once a week.  Dispense: 1.5 mL; Refill: 2 - Insulin Syringe-Needle U-100 (INSULIN SYRINGE 1CC/31GX5/16") 31G X 5/16" 1 ML MISC; Use as directed  Dispense: 100 each; Refill: 6  2. Hyperlipidemia associated with type 2 diabetes mellitus (HCC) Refill Pravachol.  Encourage compliance. - Lipid panel  3. Medically noncompliant   4. Screening for HIV (human immunodeficiency virus) - HIV Antibody (routine testing w rflx)  5. Need for hepatitis C screening test - Hepatitis C Antibody  6. Need for influenza vaccination Given today.  7. Need for Tdap vaccination Given today.  8. History of herpes genitalis Discussed diagnosis of genital herpes including signs and symptoms.  Advised to refrain from sexual intercourse whenever she has an outbreak as viral shedding is at its greatest during active outbreak.  If she has another outbreak, I advised her to schedule an urgent care visit so that I can take a look and send the herpes culture.  Printed information given on herpes.    [employer] Language interpreter used during this encounter. #[id number], Martin  Patient was given the opportunity to ask questions.  Patient verbalized understanding of the plan and was able to repeat key elements of the plan.   Orders Placed This Encounter  Procedures   Tdap vaccine greater than or equal to 7yo IM   Flu Vaccine QUAD  6+ mos PF IM (Fluarix Quad PF)   Microalbumin / creatinine urine ratio   CBC   Comprehensive metabolic  panel   Lipid panel   HIV Antibody (routine testing w rflx)   Hepatitis C Antibody   Ambulatory referral to Ophthalmology   POCT glucose (manual entry)   POCT glycosylated hemoglobin (Hb A1C)     Requested Prescriptions   Signed Prescriptions Disp Refills   glucose blood (ONETOUCH VERIO) test strip 100 each 12    Sig: Use as instructed to check blood sugar 3 times daily. Dx code: E11.42   Blood Glucose Monitoring Suppl (ONETOUCH VERIO) w/Device KIT 1 kit 0    Sig: Use as instructed to check blood sugar three times daily. D82.64   OneTouch Delica Lancets 15A MISC 100 each 6    Sig: Use as instructed to check blood sugar 3 times daily. Dx code: E11.42   metFORMIN (GLUCOPHAGE-XR) 500 MG 24 hr tablet 360 tablet 2    Sig: TAKE 2 TABLETS BY MOUTH TWICE DAILY WITH A MEAL   insulin NPH Human (NOVOLIN N) 100 UNIT/ML injection 10 mL 11    Sig: Inject 0.2 mLs (20 Units total) into the skin 2 (two) times daily before a meal.   Semaglutide,0.25 or 0.5MG /DOS, (OZEMPIC, 0.25 OR 0.5 MG/DOSE,) 2 MG/1.5ML SOPN 1.5 mL 2    Sig: Inject 0.25 mg into the skin once a week.   Insulin Syringe-Needle U-100 (INSULIN SYRINGE 1CC/31GX5/16") 31G X 5/16" 1 ML MISC 100 each 6    Sig: Use as directed    Return in about 4 months (around 03/18/2022) for Appt with [facility] in 2 wks for BS check.  Karle Plumber, MD, FACP

## 2021-11-19 LAB — CBC
Hematocrit: 39.8 % (ref 34.0–46.6)
Hemoglobin: 12.8 g/dL (ref 11.1–15.9)
MCH: 27.9 pg (ref 26.6–33.0)
MCHC: 32.2 g/dL (ref 31.5–35.7)
MCV: 87 fL (ref 79–97)
Platelets: 226 10*3/uL (ref 150–450)
RBC: 4.58 x10E6/uL (ref 3.77–5.28)
RDW: 13.9 % (ref 11.7–15.4)
WBC: 6.1 10*3/uL (ref 3.4–10.8)

## 2021-11-19 LAB — COMPREHENSIVE METABOLIC PANEL
ALT: 25 IU/L (ref 0–32)
AST: 22 IU/L (ref 0–40)
Albumin/Globulin Ratio: 1.4 (ref 1.2–2.2)
Albumin: 4.2 g/dL (ref 3.8–4.8)
Alkaline Phosphatase: 74 IU/L (ref 44–121)
BUN/Creatinine Ratio: 17 (ref 9–23)
BUN: 8 mg/dL (ref 6–20)
Bilirubin Total: 0.4 mg/dL (ref 0.0–1.2)
CO2: 19 mmol/L — ABNORMAL LOW (ref 20–29)
Calcium: 9.6 mg/dL (ref 8.7–10.2)
Chloride: 100 mmol/L (ref 96–106)
Creatinine, Ser: 0.47 mg/dL — ABNORMAL LOW (ref 0.57–1.00)
Globulin, Total: 2.9 g/dL (ref 1.5–4.5)
Glucose: 293 mg/dL — ABNORMAL HIGH (ref 70–99)
Potassium: 4 mmol/L (ref 3.5–5.2)
Sodium: 138 mmol/L (ref 134–144)
Total Protein: 7.1 g/dL (ref 6.0–8.5)
eGFR: 126 mL/min/{1.73_m2} (ref >59)

## 2021-11-19 LAB — LIPID PANEL
Chol/HDL Ratio: 3.4 ratio (ref 0.0–4.4)
Cholesterol, Total: 222 mg/dL — ABNORMAL HIGH (ref 100–199)
HDL: 66 mg/dL (ref >39)
LDL Chol Calc (NIH): 136 mg/dL — ABNORMAL HIGH (ref 0–99)
Triglycerides: 116 mg/dL (ref 0–149)
VLDL Cholesterol Cal: 20 mg/dL (ref 5–40)

## 2021-11-19 LAB — HEPATITIS C ANTIBODY: Hep C Virus Ab: <0.1 s/co ratio (ref 0.0–0.9)

## 2021-11-19 LAB — MICROALBUMIN / CREATININE URINE RATIO
Creatinine, Urine: 72.3 mg/dL
Microalb/Creat Ratio: 44 mg/g creat — ABNORMAL HIGH (ref 0–29)
Microalbumin, Urine: 31.5 ug/mL

## 2021-11-19 LAB — HIV ANTIBODY (ROUTINE TESTING W REFLEX): HIV Screen 4th Generation wRfx: NONREACTIVE

## 2021-12-01 ENCOUNTER — Telehealth: Payer: Self-pay | Admitting: Internal Medicine

## 2021-12-01 NOTE — Telephone Encounter (Signed)
I do not have any availability for tomorrow afternoon. Can we reschedule this patient for a different day?

## 2021-12-01 NOTE — Telephone Encounter (Signed)
Attempted to reach the office staff, two times with no answer. Pt would like to reschedule her appt with [facility] tomorrow for the afternoon. Please advise CB- [phone]

## 2021-12-02 ENCOUNTER — Ambulatory Visit: Payer: Commercial Managed Care - PPO | Admitting: Pharmacist

## 2022-05-13 ENCOUNTER — Observation Stay (HOSPITAL_COMMUNITY)
Admission: EM | Admit: 2022-05-13 | Discharge: 2022-05-15 | Disposition: A | Discharge status: Home / Self Care | Payer: Commercial Managed Care - PPO | Attending: Internal Medicine | Admitting: Internal Medicine

## 2022-05-13 ENCOUNTER — Encounter (HOSPITAL_COMMUNITY): Payer: Self-pay

## 2022-05-13 ENCOUNTER — Other Ambulatory Visit: Payer: Self-pay

## 2022-05-13 ENCOUNTER — Emergency Department (HOSPITAL_COMMUNITY): Payer: Commercial Managed Care - PPO

## 2022-05-13 DIAGNOSIS — L02215 Cutaneous abscess of perineum: Secondary | ICD-10-CM | POA: Diagnosis present

## 2022-05-13 DIAGNOSIS — M726 Necrotizing fasciitis: Secondary | ICD-10-CM | POA: Diagnosis present

## 2022-05-13 DIAGNOSIS — L02211 Cutaneous abscess of abdominal wall: Secondary | ICD-10-CM | POA: Diagnosis present

## 2022-05-13 DIAGNOSIS — Z833 Family history of diabetes mellitus: Secondary | ICD-10-CM

## 2022-05-13 DIAGNOSIS — E8809 Other disorders of plasma-protein metabolism, not elsewhere classified: Secondary | ICD-10-CM | POA: Diagnosis present

## 2022-05-13 DIAGNOSIS — R21 Rash and other nonspecific skin eruption: Secondary | ICD-10-CM | POA: Diagnosis present

## 2022-05-13 DIAGNOSIS — Z79899 Other long term (current) drug therapy: Secondary | ICD-10-CM | POA: Insufficient documentation

## 2022-05-13 DIAGNOSIS — L03311 Cellulitis of abdominal wall: Principal | ICD-10-CM | POA: Insufficient documentation

## 2022-05-13 DIAGNOSIS — Z794 Long term (current) use of insulin: Secondary | ICD-10-CM | POA: Diagnosis not present

## 2022-05-13 DIAGNOSIS — E11628 Type 2 diabetes mellitus with other skin complications: Secondary | ICD-10-CM | POA: Diagnosis not present

## 2022-05-13 DIAGNOSIS — R739 Hyperglycemia, unspecified: Secondary | ICD-10-CM

## 2022-05-13 DIAGNOSIS — L03314 Cellulitis of groin: Secondary | ICD-10-CM | POA: Diagnosis present

## 2022-05-13 DIAGNOSIS — Z8249 Family history of ischemic heart disease and other diseases of the circulatory system: Secondary | ICD-10-CM

## 2022-05-13 DIAGNOSIS — E663 Overweight: Secondary | ICD-10-CM | POA: Diagnosis present

## 2022-05-13 DIAGNOSIS — L0390 Cellulitis, unspecified: Secondary | ICD-10-CM

## 2022-05-13 DIAGNOSIS — Z7984 Long term (current) use of oral hypoglycemic drugs: Secondary | ICD-10-CM

## 2022-05-13 DIAGNOSIS — E1165 Type 2 diabetes mellitus with hyperglycemia: Secondary | ICD-10-CM | POA: Insufficient documentation

## 2022-05-13 DIAGNOSIS — D649 Anemia, unspecified: Secondary | ICD-10-CM | POA: Diagnosis present

## 2022-05-13 DIAGNOSIS — R102 Pelvic and perineal pain: Secondary | ICD-10-CM | POA: Diagnosis present

## 2022-05-13 DIAGNOSIS — B9561 Methicillin susceptible Staphylococcus aureus infection as the cause of diseases classified elsewhere: Secondary | ICD-10-CM | POA: Diagnosis present

## 2022-05-13 DIAGNOSIS — R7401 Elevation of levels of liver transaminase levels: Secondary | ICD-10-CM | POA: Diagnosis present

## 2022-05-13 DIAGNOSIS — E876 Hypokalemia: Secondary | ICD-10-CM | POA: Diagnosis present

## 2022-05-13 DIAGNOSIS — E872 Acidosis, unspecified: Secondary | ICD-10-CM | POA: Diagnosis present

## 2022-05-13 DIAGNOSIS — L0291 Cutaneous abscess, unspecified: Secondary | ICD-10-CM

## 2022-05-13 DIAGNOSIS — Z6826 Body mass index (BMI) 26.0-26.9, adult: Secondary | ICD-10-CM

## 2022-05-13 LAB — CBC WITH DIFFERENTIAL/PLATELET
Abs Immature Granulocytes: 0.03 10*3/uL (ref 0.00–0.07)
Basophils Absolute: 0 10*3/uL (ref 0.0–0.1)
Basophils Relative: 0 %
Eosinophils Absolute: 0.2 10*3/uL (ref 0.0–0.5)
Eosinophils Relative: 1 %
HCT: 35.5 % — ABNORMAL LOW (ref 36.0–46.0)
Hemoglobin: 12.2 g/dL (ref 12.0–15.0)
Immature Granulocytes: 0 %
Lymphocytes Relative: 16 %
Lymphs Abs: 1.8 10*3/uL (ref 0.7–4.0)
MCH: 29.5 pg (ref 26.0–34.0)
MCHC: 34.4 g/dL (ref 30.0–36.0)
MCV: 85.7 fL (ref 80.0–100.0)
Monocytes Absolute: 0.7 10*3/uL (ref 0.1–1.0)
Monocytes Relative: 6 %
Neutro Abs: 8.6 10*3/uL — ABNORMAL HIGH (ref 1.7–7.7)
Neutrophils Relative %: 77 %
Platelets: 202 10*3/uL (ref 150–400)
RBC: 4.14 MIL/uL (ref 3.87–5.11)
RDW: 14 % (ref 11.5–15.5)
WBC: 11.3 10*3/uL — ABNORMAL HIGH (ref 4.0–10.5)
nRBC: 0 % (ref 0.0–0.2)

## 2022-05-13 LAB — URINALYSIS, ROUTINE W REFLEX MICROSCOPIC
Bilirubin Urine: NEGATIVE
Glucose, UA: >=500 mg/dL — AB
Hgb urine dipstick: NEGATIVE
Ketones, ur: 20 mg/dL — AB
Nitrite: NEGATIVE
Protein, ur: NEGATIVE mg/dL
Specific Gravity, Urine: 1.04 — ABNORMAL HIGH (ref 1.005–1.030)
pH: 6 (ref 5.0–8.0)

## 2022-05-13 LAB — BASIC METABOLIC PANEL
Anion gap: 10 (ref 5–15)
BUN: 6 mg/dL (ref 6–20)
CO2: 21 mmol/L — ABNORMAL LOW (ref 22–32)
Calcium: 9 mg/dL (ref 8.9–10.3)
Chloride: 107 mmol/L (ref 98–111)
Creatinine, Ser: 0.45 mg/dL (ref 0.44–1.00)
GFR, Estimated: >60 mL/min (ref >60)
Glucose, Bld: 377 mg/dL — ABNORMAL HIGH (ref 70–99)
Potassium: 3.3 mmol/L — ABNORMAL LOW (ref 3.5–5.1)
Sodium: 138 mmol/L (ref 135–145)

## 2022-05-13 LAB — CBG MONITORING, ED: Glucose-Capillary: 324 mg/dL — ABNORMAL HIGH (ref 70–99)

## 2022-05-13 LAB — LACTIC ACID, PLASMA: Lactic Acid, Venous: 1.2 mmol/L (ref 0.5–1.9)

## 2022-05-13 MED ORDER — PIPERACILLIN-TAZOBACTAM 3.375 G IVPB 30 MIN: 3.3750 g | Freq: Once | INTRAVENOUS | Status: DC

## 2022-05-13 MED ORDER — IBUPROFEN 800 MG PO TABS
800.0000 mg | ORAL_TABLET | Freq: Once | ORAL | Status: AC
  Administered 2022-05-13: 800 mg via ORAL
  Filled 2022-05-13: qty 1

## 2022-05-13 MED ORDER — SODIUM CHLORIDE 0.9 % IV BOLUS
1000.0000 mL | Freq: Once | INTRAVENOUS | Status: AC
  Administered 2022-05-13: 1000 mL via INTRAVENOUS

## 2022-05-13 MED ORDER — LIDOCAINE-EPINEPHRINE 2 %-1:100000 IJ SOLN
10.0000 mL | Freq: Once | INTRAMUSCULAR | Status: AC
  Administered 2022-05-14: 10 mL via INTRADERMAL
  Filled 2022-05-13: qty 1

## 2022-05-13 MED ORDER — PIPERACILLIN-TAZOBACTAM 3.375 G IVPB 30 MIN: 4.5000 g | Freq: Once | INTRAVENOUS | Status: DC

## 2022-05-13 MED ORDER — SODIUM CHLORIDE 0.9 % IV SOLN: INTRAVENOUS | Status: DC

## 2022-05-13 MED ORDER — POTASSIUM CHLORIDE 10 MEQ/100ML IV SOLN
10.0000 meq | INTRAVENOUS | Status: AC
  Administered 2022-05-13 – 2022-05-14 (×2): 10 meq via INTRAVENOUS
  Filled 2022-05-13 (×2): qty 100

## 2022-05-13 MED ORDER — SODIUM CHLORIDE (PF) 0.9 % IJ SOLN
INTRAMUSCULAR | Status: AC
  Filled 2022-05-13: qty 50

## 2022-05-13 MED ORDER — PIPERACILLIN-TAZOBACTAM 3.375 G IVPB 30 MIN
3.3750 g | Freq: Once | INTRAVENOUS | Status: AC
  Administered 2022-05-13: 3.375 g via INTRAVENOUS
  Filled 2022-05-13: qty 50

## 2022-05-13 MED ORDER — LINEZOLID 600 MG/300ML IV SOLN
600.0000 mg | Freq: Two times a day (BID) | INTRAVENOUS | Status: DC
Start: 2022-05-13 — End: 2022-05-15
  Administered 2022-05-13 – 2022-05-15 (×4): 600 mg via INTRAVENOUS
  Filled 2022-05-13 (×4): qty 300

## 2022-05-13 MED ORDER — ACETAMINOPHEN 325 MG PO TABS
650.0000 mg | ORAL_TABLET | Freq: Once | ORAL | Status: AC
  Administered 2022-05-13: 650 mg via ORAL
  Filled 2022-05-13: qty 2

## 2022-05-13 MED ORDER — INSULIN ASPART 100 UNIT/ML IJ SOLN
4.0000 [IU] | Freq: Once | INTRAMUSCULAR | Status: AC
  Administered 2022-05-13: 4 [IU] via SUBCUTANEOUS
  Filled 2022-05-13: qty 0.04

## 2022-05-13 MED ORDER — MORPHINE SULFATE (PF) 4 MG/ML IV SOLN
4.0000 mg | Freq: Once | INTRAVENOUS | Status: AC
  Administered 2022-05-14: 4 mg via INTRAVENOUS
  Filled 2022-05-13: qty 1

## 2022-05-13 MED ORDER — POTASSIUM CHLORIDE 20 MEQ PO PACK
60.0000 meq | PACK | ORAL | Status: AC
Start: 2022-05-13 — End: 2022-05-13
  Administered 2022-05-13: 60 meq via ORAL
  Filled 2022-05-13: qty 3

## 2022-05-13 MED ORDER — IOHEXOL 300 MG/ML  SOLN
80.0000 mL | Freq: Once | INTRAMUSCULAR | Status: AC | PRN
  Administered 2022-05-13: 80 mL via INTRAVENOUS

## 2022-05-13 NOTE — ED Triage Notes (Signed)
Pt reports yesterday she noticed a pimple on her vagina and today the bump has gotten bigger and she now has a rash.

## 2022-05-13 NOTE — ED Provider Notes (Signed)
[facility] DEPT Provider Note   CSN: [id number] Arrival date & time: 05/13/22  1631     History {Add pertinent medical, surgical, social history, OB history to HPI:1} Chief Complaint  Patient presents with   Abscess   Rash    Brenda Greer is a 36 y.o. female.  History obtained by [employer] video interpreter  36 yo F with IDDM who presents with pelvic rash. States that she grew a bump that became infected. In her private area outside of her vaginal. Started yesterday. Denies vaginal bleeding, discharge, frequency, urgency. Does have burning when she pees now. Thinks that she may have had a fever but has not taken her temperature. Tried tylenol but it only helped minimally.  Did shave several days ago.   Abscess Rash      Home Medications Prior to Admission medications   Medication Sig Start Date End Date Taking? Authorizing Provider  Accu-Chek Softclix Lancets lancets Use as instructed 11/18/21   Ladell Pier, MD  Blood Glucose Monitoring Suppl (ACCU-CHEK GUIDE) w/Device KIT [facility] 11/18/21   Ladell Pier, MD  Blood Glucose Monitoring Suppl (ONETOUCH VERIO) w/Device KIT Use as instructed to check blood sugar three times daily. E11.42 11/18/21   Ladell Pier, MD  glucose blood (ACCU-CHEK GUIDE) test strip Test Blood sugars twice a day before meals. 11/18/21   Ladell Pier, MD  insulin NPH Human (NOVOLIN N) 100 UNIT/ML injection Inject 0.2 mLs (20 Units total) into the skin 2 (two) times daily before a meal. 11/18/21   Ladell Pier, MD  Insulin Syringe-Needle U-100 (INSULIN SYRINGE 1CC/31GX5/16") 31G X 5/16" 1 ML MISC Use as directed 11/18/21   Ladell Pier, MD  metFORMIN (GLUCOPHAGE-XR) 500 MG 24 hr tablet TAKE 2 TABLETS BY MOUTH TWICE DAILY WITH A MEAL 11/18/21   Ladell Pier, MD  pravastatin (PRAVACHOL) 40 MG tablet Take 1 tablet (40 mg total) by mouth daily. Dose has increased. Patient not taking: Reported on  11/18/2021 01/28/21   Ladell Pier, MD  Semaglutide,0.25 or 0.5MG /DOS, (OZEMPIC, 0.25 OR 0.5 MG/DOSE,) 2 MG/1.5ML SOPN Inject 0.25 mg into the skin once a week. 11/18/21   Ladell Pier, MD      Allergies    Patient has no known allergies.    Review of Systems   Review of Systems  Skin:  Positive for rash.    Physical Exam Updated Vital Signs BP (!) 131/91 (BP Location: Left Arm)   Pulse 60   Temp 98.3 F (36.8 C) (Oral)   Resp 16   SpO2 96%  Physical Exam Vitals and nursing note reviewed.  Constitutional:      General: She is not in acute distress.    Appearance: She is well-developed. She is not ill-appearing.  HENT:     Head: Normocephalic and atraumatic.     Right Ear: External ear normal.     Left Ear: External ear normal.     Nose: Nose normal.     Mouth/Throat:     Mouth: Mucous membranes are moist.     Pharynx: Oropharynx is clear.  Eyes:     Conjunctiva/sclera: Conjunctivae normal.  Cardiovascular:     Rate and Rhythm: Normal rate and regular rhythm.     Heart sounds: No murmur heard. Pulmonary:     Effort: Pulmonary effort is normal. No respiratory distress.     Breath sounds: Normal breath sounds.  Abdominal:     General: Abdomen is flat. There  is no distension.     Palpations: There is no mass.     Tenderness: There is abdominal tenderness (Across lower abdomen). There is no guarding.  Musculoskeletal:        General: No swelling.     Cervical back: Neck supple.  Skin:    General: Skin is warm and dry.     Capillary Refill: Capillary refill takes less than 2 seconds.     Comments: Chaperoned by RN Loree Fee.  Patient with painful erythematous rash in left lower quadrant that spreads towards her perineum.  Pustule in the left lower quadrant shown in image below.  No crepitance noted. TTP overlying cellulitis but does not extend past erythema.   Neurological:     Mental Status: She is alert.  Psychiatric:        Mood and Affect: Mood normal.        ED Results / Procedures / Treatments   Labs (all labs ordered are listed, but only abnormal results are displayed) Labs Reviewed  URINALYSIS, ROUTINE W REFLEX MICROSCOPIC - Abnormal; Notable for the following components:      Result Value   Specific Gravity, Urine 1.040 (*)    Glucose, UA >=500 (*)    Ketones, ur 20 (*)    Leukocytes,Ua SMALL (*)    Bacteria, UA MANY (*)    All other components within normal limits    EKG None  Radiology No results found.  Procedures Procedures  {Document cardiac monitor, telemetry assessment procedure when appropriate:1}  Medications Ordered in ED Medications  ibuprofen (ADVIL) tablet 800 mg (800 mg Oral Given 05/13/22 1722)    ED Course/ Medical Decision Making/ A&P Clinical Course as of 05/13/22 2323  Sat May 13, 2022  2311 CT ABDOMEN PELVIS W CONTRAST IMPRESSION: 1. Subcutaneous edema in the mons pubis and lower abdominal wall concerning for cellulitis. 2. Ill-defined fluid collection just beneath the skin surface in the lower left anterior abdominal wall concerning for early abscess/phlegmon. 3. No evidence for soft tissue gas. 4. Prominent inguinal lymph nodes are likely reactive. 5. Cholelithiasis. [RP]    Clinical Course User Index [RP] Fransico Meadow, MD                           Medical Decision Making Amount and/or Complexity of Data Reviewed Labs: ordered. Radiology: ordered. Decision-making details documented in ED Course.  Risk OTC drugs. Prescription drug management. Decision regarding hospitalization.   ***  {Document critical care time when appropriate:1} {Document review of labs and clinical decision tools ie heart score, Chads2Vasc2 etc:1}  {Document your independent review of radiology images, and any outside records:1} {Document your discussion with family members, caretakers, and with consultants:1} {Document social determinants of health affecting pt's care:1} {Document your  decision making why or why not admission, treatments were needed:1} Final Clinical Impression(s) / ED Diagnoses Final diagnoses:  None    Rx / DC Orders ED Discharge Orders     None

## 2022-05-13 NOTE — ED Provider Triage Note (Signed)
Emergency Medicine Provider Triage Evaluation Note  Brenda Greer , a 36 y.o. female  was evaluated in triage.  Pt complains of vaginal pain and rash.  Patient noticed a "pimple" in her groin yesterday that has been getting progressively more painful.  States she has had something similar in the past, does not think she needed it drained.  She is having some discomfort while urinating, no difficulties having bowel movement.  Had some chills last night.  Review of Systems  Positive: Chills, vaginal pain, rash, dysuria Negative: Diarrhea, blood in stool, vaginal discharge  Physical Exam  BP (!) 131/91 (BP Location: Left Arm)   Pulse 60   Temp 98.3 F (36.8 C) (Oral)   Resp 16   SpO2 96%  Gen:   Awake, no distress   Resp:  Normal effort  MSK:   Moves extremities without difficulty  Other:    Medical Decision Making  Medically screening exam initiated at 5:03 PM.  Appropriate orders placed.  Sanoe Hazan was informed that the remainder of the evaluation will be completed by another provider, this initial triage assessment does not replace that evaluation, and the importance of remaining in the ED until their evaluation is complete.  Given ibuprofen for pain, urinalysis ordered.   Ysabela Keisler T, PA-C 05/13/22 1704

## 2022-05-13 NOTE — ED Provider Notes (Signed)
Considered risks and benefits of getting contrasted CT with the patient's renal function pending and feel that CT is warranted at this time to expedite diagnosis so will waive labs.    Rondel Baton, MD 05/13/22 2236

## 2022-05-14 ENCOUNTER — Encounter (HOSPITAL_COMMUNITY): Payer: Self-pay | Admitting: Internal Medicine

## 2022-05-14 DIAGNOSIS — L03314 Cellulitis of groin: Secondary | ICD-10-CM | POA: Diagnosis present

## 2022-05-14 DIAGNOSIS — R7401 Elevation of levels of liver transaminase levels: Secondary | ICD-10-CM | POA: Diagnosis present

## 2022-05-14 DIAGNOSIS — Z7984 Long term (current) use of oral hypoglycemic drugs: Secondary | ICD-10-CM | POA: Diagnosis not present

## 2022-05-14 DIAGNOSIS — Z8249 Family history of ischemic heart disease and other diseases of the circulatory system: Secondary | ICD-10-CM | POA: Diagnosis not present

## 2022-05-14 DIAGNOSIS — E876 Hypokalemia: Secondary | ICD-10-CM

## 2022-05-14 DIAGNOSIS — R102 Pelvic and perineal pain: Secondary | ICD-10-CM | POA: Diagnosis present

## 2022-05-14 DIAGNOSIS — D649 Anemia, unspecified: Secondary | ICD-10-CM

## 2022-05-14 DIAGNOSIS — Z6826 Body mass index (BMI) 26.0-26.9, adult: Secondary | ICD-10-CM | POA: Diagnosis not present

## 2022-05-14 DIAGNOSIS — L03311 Cellulitis of abdominal wall: Secondary | ICD-10-CM

## 2022-05-14 DIAGNOSIS — E872 Acidosis, unspecified: Secondary | ICD-10-CM

## 2022-05-14 DIAGNOSIS — Z833 Family history of diabetes mellitus: Secondary | ICD-10-CM | POA: Diagnosis not present

## 2022-05-14 DIAGNOSIS — E1165 Type 2 diabetes mellitus with hyperglycemia: Secondary | ICD-10-CM | POA: Diagnosis not present

## 2022-05-14 DIAGNOSIS — E663 Overweight: Secondary | ICD-10-CM | POA: Diagnosis present

## 2022-05-14 DIAGNOSIS — E11628 Type 2 diabetes mellitus with other skin complications: Secondary | ICD-10-CM | POA: Diagnosis present

## 2022-05-14 DIAGNOSIS — R21 Rash and other nonspecific skin eruption: Secondary | ICD-10-CM | POA: Diagnosis present

## 2022-05-14 DIAGNOSIS — E8809 Other disorders of plasma-protein metabolism, not elsewhere classified: Secondary | ICD-10-CM | POA: Diagnosis present

## 2022-05-14 DIAGNOSIS — L02211 Cutaneous abscess of abdominal wall: Secondary | ICD-10-CM | POA: Diagnosis present

## 2022-05-14 DIAGNOSIS — Z794 Long term (current) use of insulin: Secondary | ICD-10-CM | POA: Diagnosis not present

## 2022-05-14 DIAGNOSIS — Z79899 Other long term (current) drug therapy: Secondary | ICD-10-CM | POA: Diagnosis not present

## 2022-05-14 DIAGNOSIS — B9561 Methicillin susceptible Staphylococcus aureus infection as the cause of diseases classified elsewhere: Secondary | ICD-10-CM | POA: Diagnosis present

## 2022-05-14 DIAGNOSIS — L02215 Cutaneous abscess of perineum: Secondary | ICD-10-CM | POA: Diagnosis present

## 2022-05-14 DIAGNOSIS — M726 Necrotizing fasciitis: Secondary | ICD-10-CM | POA: Diagnosis present

## 2022-05-14 LAB — BASIC METABOLIC PANEL
Anion gap: 8 (ref 5–15)
BUN: <5 mg/dL — ABNORMAL LOW (ref 6–20)
CO2: 21 mmol/L — ABNORMAL LOW (ref 22–32)
Calcium: 7.6 mg/dL — ABNORMAL LOW (ref 8.9–10.3)
Chloride: 108 mmol/L (ref 98–111)
Creatinine, Ser: <0.3 mg/dL — ABNORMAL LOW (ref 0.44–1.00)
Glucose, Bld: 258 mg/dL — ABNORMAL HIGH (ref 70–99)
Potassium: 3.4 mmol/L — ABNORMAL LOW (ref 3.5–5.1)
Sodium: 137 mmol/L (ref 135–145)

## 2022-05-14 LAB — CBC WITH DIFFERENTIAL/PLATELET
Abs Immature Granulocytes: 0.03 10*3/uL (ref 0.00–0.07)
Basophils Absolute: 0 10*3/uL (ref 0.0–0.1)
Basophils Relative: 0 %
Eosinophils Absolute: 0 10*3/uL (ref 0.0–0.5)
Eosinophils Relative: 0 %
HCT: 32.5 % — ABNORMAL LOW (ref 36.0–46.0)
Hemoglobin: 10.8 g/dL — ABNORMAL LOW (ref 12.0–15.0)
Immature Granulocytes: 0 %
Lymphocytes Relative: 12 %
Lymphs Abs: 1.1 10*3/uL (ref 0.7–4.0)
MCH: 29 pg (ref 26.0–34.0)
MCHC: 33.2 g/dL (ref 30.0–36.0)
MCV: 87.1 fL (ref 80.0–100.0)
Monocytes Absolute: 0.5 10*3/uL (ref 0.1–1.0)
Monocytes Relative: 5 %
Neutro Abs: 7.5 10*3/uL (ref 1.7–7.7)
Neutrophils Relative %: 83 %
Platelets: 184 10*3/uL (ref 150–400)
RBC: 3.73 MIL/uL — ABNORMAL LOW (ref 3.87–5.11)
RDW: 13.9 % (ref 11.5–15.5)
WBC: 9.1 10*3/uL (ref 4.0–10.5)
nRBC: 0 % (ref 0.0–0.2)

## 2022-05-14 LAB — GLUCOSE, CAPILLARY
Glucose-Capillary: 209 mg/dL — ABNORMAL HIGH (ref 70–99)
Glucose-Capillary: 237 mg/dL — ABNORMAL HIGH (ref 70–99)
Glucose-Capillary: 222 mg/dL — ABNORMAL HIGH (ref 70–99)
Glucose-Capillary: 296 mg/dL — ABNORMAL HIGH (ref 70–99)

## 2022-05-14 LAB — BLOOD GAS, VENOUS
Acid-base deficit: 4.3 mmol/L — ABNORMAL HIGH (ref 0.0–2.0)
Bicarbonate: 21.6 mmol/L (ref 20.0–28.0)
O2 Saturation: 39 %
Patient temperature: 37.1
pCO2, Ven: 42 mmHg — ABNORMAL LOW (ref 44–60)
pH, Ven: 7.32 (ref 7.25–7.43)
pO2, Ven: <31 mmHg — CL (ref 32–45)

## 2022-05-14 LAB — HEPATIC FUNCTION PANEL
ALT: 27 U/L (ref 0–44)
AST: 66 U/L — ABNORMAL HIGH (ref 15–41)
Albumin: 2.6 g/dL — ABNORMAL LOW (ref 3.5–5.0)
Alkaline Phosphatase: 55 U/L (ref 38–126)
Bilirubin, Direct: 0.1 mg/dL (ref 0.0–0.2)
Indirect Bilirubin: 0.6 mg/dL (ref 0.3–0.9)
Total Bilirubin: 0.7 mg/dL (ref 0.3–1.2)
Total Protein: 5.9 g/dL — ABNORMAL LOW (ref 6.5–8.1)

## 2022-05-14 LAB — HEMOGLOBIN A1C
Hgb A1c MFr Bld: 12.9 % — ABNORMAL HIGH (ref 4.8–5.6)
Mean Plasma Glucose: 323.53 mg/dL

## 2022-05-14 LAB — C-REACTIVE PROTEIN: CRP: 17.2 mg/dL — ABNORMAL HIGH (ref <1.0)

## 2022-05-14 MED ORDER — ACETAMINOPHEN 650 MG RE SUPP: 650.0000 mg | Freq: Four times a day (QID) | RECTAL | Status: DC | PRN

## 2022-05-14 MED ORDER — PIPERACILLIN-TAZOBACTAM 3.375 G IVPB
3.3750 g | Freq: Three times a day (TID) | INTRAVENOUS | Status: DC
  Administered 2022-05-14 – 2022-05-15 (×4): 3.375 g via INTRAVENOUS
  Filled 2022-05-14 (×5): qty 50

## 2022-05-14 MED ORDER — INSULIN ASPART 100 UNIT/ML IJ SOLN
0.0000 [IU] | Freq: Three times a day (TID) | INTRAMUSCULAR | Status: DC
  Administered 2022-05-14 (×3): 3 [IU] via SUBCUTANEOUS
  Administered 2022-05-15: 2 [IU] via SUBCUTANEOUS

## 2022-05-14 MED ORDER — INSULIN NPH (HUMAN) (ISOPHANE) 100 UNIT/ML ~~LOC~~ SUSP
25.0000 [IU] | Freq: Two times a day (BID) | SUBCUTANEOUS | Status: DC
  Administered 2022-05-14 – 2022-05-15 (×3): 25 [IU] via SUBCUTANEOUS
  Filled 2022-05-14: qty 10

## 2022-05-14 MED ORDER — ACETAMINOPHEN 325 MG PO TABS
650.0000 mg | ORAL_TABLET | Freq: Four times a day (QID) | ORAL | Status: DC | PRN
  Administered 2022-05-14: 650 mg via ORAL
  Filled 2022-05-14: qty 2

## 2022-05-14 MED ORDER — POTASSIUM CHLORIDE 20 MEQ PO PACK
40.0000 meq | PACK | Freq: Two times a day (BID) | ORAL | Status: AC
Start: 2022-05-14 — End: 2022-05-14
  Administered 2022-05-14 (×2): 40 meq via ORAL
  Filled 2022-05-14 (×2): qty 2

## 2022-05-14 MED ORDER — LACTATED RINGERS IV SOLN: INTRAVENOUS | Status: AC

## 2022-05-14 NOTE — H&P (Signed)
History and Physical    Brenda Greer ZOX:[id number] DOB: Dec 12, 1985 DOA: 05/13/2022  PCP: Ladell Pier, MD  Patient coming from: Home.  Patent attorney used.  Chief Complaint: Left lower quadrant abdominal wall swelling.  HPI: Brenda Greer is a 36 y.o. female with history of diabetes mellitus type 2 noticed increasing swelling and erythema which gradually progressed over the last 72 hours on the left lower quadrant and infraumbilical area.  Denies any nausea vomiting diarrhea.  Denies any vaginal discharge.  ED Course: In the ER CT abdomen pelvis shows features concerning for abdominal wall cellulitis with possible early abscess.  ER physician did incision and drainage.  Started on empiric antibiotics admitted for further work-up.  Review of Systems: As per HPI, rest all negative.   Past Medical History:  Diagnosis Date   Diabetes mellitus without complication ([place])     History reviewed. No pertinent surgical history.   reports that she has never smoked. She has never used smokeless tobacco. She reports that she does not drink alcohol and does not use drugs.  No Known Allergies  Family History  Problem Relation Age of Onset   Diabetes Mother    Hypertension Mother     Prior to Admission medications   Medication Sig Start Date End Date Taking? Authorizing Provider  Accu-Chek Softclix Lancets lancets Use as instructed 11/18/21   Ladell Pier, MD  Blood Glucose Monitoring Suppl (ACCU-CHEK GUIDE) w/Device KIT UAD 11/18/21   Ladell Pier, MD  Blood Glucose Monitoring Suppl (ONETOUCH VERIO) w/Device KIT Use as instructed to check blood sugar three times daily. E11.42 11/18/21   Ladell Pier, MD  glucose blood (ACCU-CHEK GUIDE) test strip Test Blood sugars twice a day before meals. 11/18/21   Ladell Pier, MD  insulin NPH Human (NOVOLIN N) 100 UNIT/ML injection Inject 0.2 mLs (20 Units total) into the skin 2 (two) times daily before a  meal. 11/18/21   Ladell Pier, MD  Insulin Syringe-Needle U-100 (INSULIN SYRINGE 1CC/31GX5/16") 31G X 5/16" 1 ML MISC Use as directed 11/18/21   Ladell Pier, MD  metFORMIN (GLUCOPHAGE-XR) 500 MG 24 hr tablet TAKE 2 TABLETS BY MOUTH TWICE DAILY WITH A MEAL 11/18/21   Ladell Pier, MD  pravastatin (PRAVACHOL) 40 MG tablet Take 1 tablet (40 mg total) by mouth daily. Dose has increased. Patient not taking: Reported on 11/18/2021 01/28/21   Ladell Pier, MD  Semaglutide,0.25 or 0.5MG /DOS, (OZEMPIC, 0.25 OR 0.5 MG/DOSE,) 2 MG/1.5ML SOPN Inject 0.25 mg into the skin once a week. 11/18/21   Ladell Pier, MD    Physical Exam: Constitutional: Moderately built and nourished. Vitals:   05/13/22 2259 05/13/22 2315 05/14/22 0045 05/14/22 0108  BP:  127/78 127/83 (!) 135/92  Pulse:  94 97 89  Resp:  18 15   Temp: 98.2 F (36.8 C)   97.7 F (36.5 C)  TempSrc: Oral   Oral  SpO2:  100% 97% 100%   Eyes: Anicteric no pallor. ENMT: No discharge from the ears eyes nose and mouth. Neck: No mass felt.  No neck rigidity. Respiratory: No rhonchi or crepitations. Cardiovascular: S1-S2 heard. Abdomen: Soft nontender erythema seen in the left lower quadrant picture is available on the media section. Musculoskeletal: No edema. Skin: Erythema on the left lower quadrant. Neurologic: Alert awake oriented to time place and person.  Moves all extremities. Psychiatric: Appears normal.  Normal affect.   Labs on Admission: I have personally reviewed following labs and imaging  studies  CBC: Recent Labs  Lab 05/13/22 2224  WBC 11.3*  NEUTROABS 8.6*  HGB 12.2  HCT 35.5*  MCV 85.7  PLT 161   Basic Metabolic Panel: Recent Labs  Lab 05/13/22 2224  NA 138  K 3.3*  CL 107  CO2 21*  GLUCOSE 377*  BUN 6  CREATININE 0.45  CALCIUM 9.0   GFR: CrCl cannot be calculated (Unknown ideal weight.). Liver Function Tests: No results for input(s): "AST", "ALT", "ALKPHOS", "BILITOT", "PROT",  "ALBUMIN" in the last 168 hours. No results for input(s): "LIPASE", "AMYLASE" in the last 168 hours. No results for input(s): "AMMONIA" in the last 168 hours. Coagulation Profile: No results for input(s): "INR", "PROTIME" in the last 168 hours. Cardiac Enzymes: No results for input(s): "CKTOTAL", "CKMB", "CKMBINDEX", "TROPONINI" in the last 168 hours. BNP (last 3 results) No results for input(s): "PROBNP" in the last 8760 hours. HbA1C: No results for input(s): "HGBA1C" in the last 72 hours. CBG: Recent Labs  Lab 05/13/22 2243  GLUCAP 324*   Lipid Profile: No results for input(s): "CHOL", "HDL", "LDLCALC", "TRIG", "CHOLHDL", "LDLDIRECT" in the last 72 hours. Thyroid Function Tests: No results for input(s): "TSH", "T4TOTAL", "FREET4", "T3FREE", "THYROIDAB" in the last 72 hours. Anemia Panel: No results for input(s): "VITAMINB12", "FOLATE", "FERRITIN", "TIBC", "IRON", "RETICCTPCT" in the last 72 hours. Urine analysis:    Component Value Date/Time   COLORURINE YELLOW 05/13/2022 1739   APPEARANCEUR CLEAR 05/13/2022 1739   LABSPEC 1.040 (H) 05/13/2022 1739   PHURINE 6.0 05/13/2022 1739   GLUCOSEU >=500 (A) 05/13/2022 1739   HGBUR NEGATIVE 05/13/2022 1739   BILIRUBINUR NEGATIVE 05/13/2022 1739   BILIRUBINUR negative 07/18/2019 1115   BILIRUBINUR neg 09/10/2017 1536   KETONESUR 20 (A) 05/13/2022 1739   PROTEINUR NEGATIVE 05/13/2022 1739   UROBILINOGEN 0.2 07/18/2019 1115   UROBILINOGEN 0.2 04/06/2013 2039   NITRITE NEGATIVE 05/13/2022 1739   LEUKOCYTESUR SMALL (A) 05/13/2022 1739   Sepsis Labs: $RemoveBefo'[email or web address]'VyzisdjHozL$ (procalcitonin:4,lacticidven:4) )No results found for this or any previous visit (from the past 240 hour(s)).   Radiological Exams on Admission: CT ABDOMEN PELVIS W CONTRAST  Result Date: 05/13/2022 CLINICAL DATA:  Sepsis, pelvic cellulitis. EXAM: CT ABDOMEN AND PELVIS WITH CONTRAST TECHNIQUE: Multidetector CT imaging of the abdomen and pelvis was performed using the standard  protocol following bolus administration of intravenous contrast. RADIATION DOSE REDUCTION: This exam was performed according to the departmental dose-optimization program which includes automated exposure control, adjustment of the mA and/or kV according to patient size and/or use of iterative reconstruction technique. CONTRAST:  61mL OMNIPAQUE IOHEXOL 300 MG/ML  SOLN COMPARISON:  CT abdomen and pelvis 04/07/2013 FINDINGS: Lower chest: No acute abnormality. Hepatobiliary: Gallstones are present. Bile ducts and liver appear within normal limits. Pancreas: Unremarkable. No pancreatic ductal dilatation or surrounding inflammatory changes. Spleen: Normal in size without focal abnormality. Adrenals/Urinary Tract: Adrenal glands are unremarkable. Kidneys are normal, without renal calculi, focal lesion, or hydronephrosis. Bladder is unremarkable. Stomach/Bowel: Stomach is within normal limits. Appendix appears normal. No evidence of bowel wall thickening, distention, or inflammatory changes. Vascular/Lymphatic: No significant vascular findings are present. No enlarged abdominal or pelvic lymph nodes. Reproductive: Uterus and bilateral adnexa are unremarkable. Other: There is no ascites or free air. There is no focal abdominal wall hernia. There is subcutaneous edema in the region of the mons pubis and lower abdominal wall, left greater than right. There is left-sided wall thickening. There is an ill-defined fluid collection just beneath the skin surface in the lower left anterior abdominal wall  image 2/82 measuring 4.0 x 1.6 x 3.5 cm. There is no evidence for soft tissue gas. There are prominent bilateral inguinal lymph nodes, left greater than right. Musculoskeletal: No acute or significant osseous findings. IMPRESSION: 1. Subcutaneous edema in the mons pubis and lower abdominal wall concerning for cellulitis. 2. Ill-defined fluid collection just beneath the skin surface in the lower left anterior abdominal wall  concerning for early abscess/phlegmon. 3. No evidence for soft tissue gas. 4. Prominent inguinal lymph nodes are likely reactive. 5. Cholelithiasis. Electronically Signed   By: Ronney Asters M.D.   On: 05/13/2022 23:05      Assessment/Plan Principal Problem:   Abdominal wall cellulitis Active Problems:   Type 2 diabetes mellitus with hyperglycemia ([facility])    Abdominal wall cellulitis with abscess -the abscess was incised and drained by the ER physician.  Follow cultures.  Continue with empiric antibiotics.  Since there was initial concern for possible necrotizing fasciitis Zyvox was started and we will add Zosyn for now until we get cultures.  Closely monitor need to see further debridement is needed or not. Diabetes mellitus type 2 uncontrolled not in DKA last hemoglobin A1c as per the patient's primary care note in February 2023 was 13.  Patient is on NovoLog 70/30 25 units twice daily as per the patient.  We will keep patient on sliding scale coverage.  Check hemoglobin A1c.  Follow metabolic panel to make sure patient is not going in DKA.  Since patient has abdominal wall cellulitis and abscess with uncontrolled diabetes will need close monitoring for any further worsening inpatient status.   DVT prophylaxis: SCDs for now until we make sure that patient does not require any further procedure. Code Status: Full code. Family Communication: Family at the bedside. Disposition Plan: Home when stable. Consults called: None. Admission status: Inpatient.   Rise Patience MD [facility] Pager [phone].  If 7PM-7AM, please contact night-coverage [email or web address] Password [email or web address]  05/14/2022, 2:37 AM

## 2022-05-14 NOTE — Progress Notes (Signed)
Pharmacy Antibiotic Note  Brenda Greer is a 36 y.o. female admitted on 05/13/2022 with  a history of insulin-dependent diabetes who presents to the emergency department with cellulitis and abscess in her left lower quadrant. .  Pharmacy has been consulted for zosyn dosing.  Plan: Zosyn 3.375g IV Q8H infused over 4hrs. Follow renal function and clinical course     Temp (24hrs), Avg:98.1 F (36.7 C), Min:97.7 F (36.5 C), Max:98.3 F (36.8 C)  Recent Labs  Lab 05/13/22 2224  WBC 11.3*  CREATININE 0.45  LATICACIDVEN 1.2    CrCl cannot be calculated (Unknown ideal weight.).    No Known Allergies    Thank you for allowing pharmacy to be a part of this patient's care.  Arley Phenix RPh 05/14/2022, 2:55 AM

## 2022-05-14 NOTE — Progress Notes (Signed)
Care started prior to midnight in the emergency room and patient was admitted early this morning after midnight by Dr. Midge Minium and I am in current agreement with his assessment and plan.  Additional changes to the plan of care been made accordingly.  The patient is a overweight Hispanic female with past medical history significant for but limited to diabetes mellitus type 2 and other comorbidities who presented with left lower quadrant abdominal wall swelling and erythema which gradually progressed over the last 72 hours.  She had left lower quadrant and infraumbilical area redness and erythema which significantly worsened after she shaved the area.  She denies any nausea vomiting or diarrhea and no vaginal discharge.  In the ED she had a CT scan done which showed concerning features for an abdominal wall cellulitis with possible early abscess.  The ED physician did an I&D at bedside and she was started on empiric antibiotics for her soft tissue infection.  She is currently being admitted for and treated for the following but not limited to:  Abdominal wall cellulitis with abscess -The abscess was I&D by the ED physician and cultures were sent which we will follow -We will continue with empiric antibiotics -Initially there was concern for necrotizing fasciitis and Zyvox was initiated but then Zosyn was also ordered -General surgery was consulted for further evaluation of the wound and will need to continue to monitor closely to see if further debridement is needed -CRP was elevated at 17.2 -She was given 2 L normal saline boluses and then started on maintenance IV fluid hydration at 125 MLS per hou with lactated Ringer's -WBC went from 11.3 is now 9.1 -Follow cultures and monitor temperature curve and WBC count  Hypokalemia -Mild.  Patient's potassium on admission was 3.3 and repeat was 3.4 -Replete with KCl 40 mEq twice daily x2 doses -Continue to monitor and replete as necessary -Check  mag level in a.m. -Repeat CMP in the a.m.  Normocytic anemia -Patient's hemoglobin/hematocrit went from 12.2/35.5 is now 10.8/32.5 -Check anemia panel in the a.m. -Continue to monitor for signs or symptoms of bleeding -Repeat CBC in a.m.  Elevated AST -Mild and likely reactive -Continue monitor and trend and repeat CMP in a.m. and if necessary will obtain a right upper quadrant ultrasound Acute hepatitis panel  Hypoalbuminemia -Patient's albumin level is now 2.6 -Continue monitor and trend and repeat CMP in a.m.  Uncontrolled diabetes mellitus type 2 -Currently not in DKA and per her last PCP note in February 2023 her hemoglobin A1c was 13 -Normally she is on NovoLog 70/30 25 units twice daily -Repeat hemoglobin A1c -Continue with sensitive NovoLog/scale insulin AC along with insulin NPH 24 units twice daily -Continue monitor blood sugars carefully per protocol and she may need diabetes education coordinator evaluation -CBGs ranging from 209-324  Metabolic acidosis -Mild -Patient has a CO2 of 21, anion gap of 8, chloride level of 108 -Continue with IV fluid hydration as above -Continue to monitor and trend and repeat CMP in a.m.  We will continue to monitor the patient's clinical response to intervention and repeat blood work in the a.m. and follow-up on general surgery recommendations

## 2022-05-14 NOTE — Consult Note (Signed)
Reason for Consult: abdominal wall cellulitis / abscess  Referring Physician: Dr. Marland Mcalpine, [facility]  Brenda Greer is an 36 y.o. female.  HPI: Patient is a 36 year old female admitted from the emergency department with left lower quadrant abdominal wall abscess and cellulitis.  Patient has type 2 diabetes.  CT scan of the abdomen and pelvis showed a small approximately 2.5 cm subcutaneous abscess in the left lower quadrant of the abdominal wall.  Incision and drainage was performed under local anesthesia by the emergency room physician.  Surgery was asked to evaluate for adequacy of drainage.  Patient speaks Spanish.  Her husband was at the bedside and provided translation.  Past Medical History:  Diagnosis Date   Diabetes mellitus without complication (HCC)     History reviewed. No pertinent surgical history.  Family History  Problem Relation Age of Onset   Diabetes Mother    Hypertension Mother     Social History:  reports that she has never smoked. She has never used smokeless tobacco. She reports that she does not drink alcohol and does not use drugs.  Allergies: No Known Allergies  Medications: I have reviewed the patient's current medications.  Results for orders placed or performed during the hospital encounter of 05/13/22 (from the past 48 hour(s))  Urinalysis, Routine w reflex microscopic Urine, Clean Catch     Status: Abnormal   Collection Time: 05/13/22  5:39 PM  Result Value Ref Range   Color, Urine YELLOW YELLOW   APPearance CLEAR CLEAR   Specific Gravity, Urine 1.040 (H) 1.005 - 1.030   pH 6.0 5.0 - 8.0   Glucose, UA >=500 (A) NEGATIVE mg/dL   Hgb urine dipstick NEGATIVE NEGATIVE   Bilirubin Urine NEGATIVE NEGATIVE   Ketones, ur 20 (A) NEGATIVE mg/dL   Protein, ur NEGATIVE NEGATIVE mg/dL   Nitrite NEGATIVE NEGATIVE   Leukocytes,Ua SMALL (A) NEGATIVE   RBC / HPF 0-5 0 - 5 RBC/hpf   WBC, UA 6-10 0 - 5 WBC/hpf   Bacteria, UA MANY (A)  NONE SEEN   Squamous Epithelial / LPF 0-5 0 - 5   Mucus PRESENT     Comment: Performed at [facility], [address], [place], [place] [address]  Basic metabolic panel     Status: Abnormal   Collection Time: 05/13/22 10:24 PM  Result Value Ref Range   Sodium 138 135 - 145 mmol/L   Potassium 3.3 (L) 3.5 - 5.1 mmol/L   Chloride 107 98 - 111 mmol/L   CO2 21 (L) 22 - 32 mmol/L   Glucose, Bld 377 (H) 70 - 99 mg/dL    Comment: Glucose reference range applies only to samples taken after fasting for at least 8 hours.   BUN 6 6 - 20 mg/dL   Creatinine, Ser 5.62 0.44 - 1.00 mg/dL   Calcium 9.0 8.9 - 13.0 mg/dL   GFR, Estimated >86 >57 mL/min    Comment: (NOTE) Calculated using the CKD-EPI Creatinine Equation (2021)    Anion gap 10 5 - 15    Comment: Performed at [facility], [address] [place], [place] [address]  CBC with Differential/Platelet     Status: Abnormal   Collection Time: 05/13/22 10:24 PM  Result Value Ref Range   WBC 11.3 (H) 4.0 - 10.5 K/uL   RBC 4.14 3.87 - 5.11 MIL/uL   Hemoglobin 12.2 12.0 - 15.0 g/dL   HCT 29.5 (L) 28.4 - 13.2 %   MCV 85.7  80.0 - 100.0 fL   MCH 29.5 26.0 - 34.0 pg   MCHC 34.4 30.0 - 36.0 g/dL   RDW 40.9 81.1 - 91.4 %   Platelets 202 150 - 400 K/uL   nRBC 0.0 0.0 - 0.2 %   Neutrophils Relative % 77 %   Neutro Abs 8.6 (H) 1.7 - 7.7 K/uL   Lymphocytes Relative 16 %   Lymphs Abs 1.8 0.7 - 4.0 K/uL   Monocytes Relative 6 %   Monocytes Absolute 0.7 0.1 - 1.0 K/uL   Eosinophils Relative 1 %   Eosinophils Absolute 0.2 0.0 - 0.5 K/uL   Basophils Relative 0 %   Basophils Absolute 0.0 0.0 - 0.1 K/uL   Immature Granulocytes 0 %   Abs Immature Granulocytes 0.03 0.00 - 0.07 K/uL    Comment: Performed at [facility], [address], [place], [place] [address]  Lactic acid, plasma     Status: None   Collection Time: 05/13/22 10:24 PM  Result Value Ref Range   Lactic Acid, Venous 1.2 0.5 - 1.9  mmol/L    Comment: Performed at [facility], [address] [place], [place] [address]  C-reactive protein     Status: Abnormal   Collection Time: 05/13/22 10:29 PM  Result Value Ref Range   CRP 17.2 (H) <1.0 mg/dL    Comment: HEMOLYSIS AT THIS LEVEL MAY AFFECT RESULT Performed at [facility], [address] [place], [place] [address]   POC CBG, ED     Status: Abnormal   Collection Time: 05/13/22 10:43 PM  Result Value Ref Range   Glucose-Capillary 324 (H) 70 - 99 mg/dL    Comment: Glucose reference range applies only to samples taken after fasting for at least 8 hours.  Blood gas, venous (at [facility], not at [facility])     Status: Abnormal   Collection Time: 05/13/22 11:51 PM  Result Value Ref Range   pH, Ven 7.32 7.25 - 7.43   pCO2, Ven 42 (L) 44 - 60 mmHg   pO2, Ven <31 (LL) 32 - 45 mmHg    Comment: CRITICAL RESULT CALLED TO, READ BACK BY AND VERIFIED WITH: KISER,C. 05/14/22 @0009  BY SEEL,M.    Bicarbonate 21.6 20.0 - 28.0 mmol/L   Acid-base deficit 4.3 (H) 0.0 - 2.0 mmol/L   O2 Saturation 39 %   Patient temperature 37.1     Comment: Performed at [facility], [address], [place], [place]  Aerobic Culture w Gram Stain (superficial specimen)     Status: None (Preliminary result)   Collection Time: 05/14/22 12:05 AM   Specimen: Other Source  Result Value Ref Range   Specimen Description OTHER    Special Requests NONE    Gram Stain      NO SQUAMOUS EPITHELIAL CELLS SEEN FEW WBC SEEN FEW GRAM POSITIVE COCCI Performed at [facility], [address], [place], [place]    Culture PENDING    Report Status PENDING   Basic metabolic panel     Status: Abnormal   Collection Time: 05/14/22  5:27 AM  Result Value Ref Range   Sodium 137 135 - 145 mmol/L   Potassium 3.4 (L) 3.5 - 5.1 mmol/L   Chloride 108 98 - 111 mmol/L   CO2 21 (L) 22 - 32 mmol/L   Glucose, Bld 258 (H) 70 - 99 mg/dL    Comment: Glucose  reference range applies only to  samples taken after fasting for at least 8 hours.   BUN <5 (L) 6 - 20 mg/dL   Creatinine, Ser <9.67 (L) 0.44 - 1.00 mg/dL   Calcium 7.6 (L) 8.9 - 10.3 mg/dL   GFR, Estimated NOT CALCULATED >60 mL/min    Comment: (NOTE) Calculated using the CKD-EPI Creatinine Equation (2021)    Anion gap 8 5 - 15    Comment: Performed at [facility], [address] [place], [place] [address]  Hepatic function panel     Status: Abnormal   Collection Time: 05/14/22  5:27 AM  Result Value Ref Range   Total Protein 5.9 (L) 6.5 - 8.1 g/dL   Albumin 2.6 (L) 3.5 - 5.0 g/dL   AST 66 (H) 15 - 41 U/L   ALT 27 0 - 44 U/L   Alkaline Phosphatase 55 38 - 126 U/L   Total Bilirubin 0.7 0.3 - 1.2 mg/dL   Bilirubin, Direct 0.1 0.0 - 0.2 mg/dL   Indirect Bilirubin 0.6 0.3 - 0.9 mg/dL    Comment: Performed at [facility], [address], [place], [place] [address]  CBC with Differential/Platelet     Status: Abnormal   Collection Time: 05/14/22  5:27 AM  Result Value Ref Range   WBC 9.1 4.0 - 10.5 K/uL   RBC 3.73 (L) 3.87 - 5.11 MIL/uL   Hemoglobin 10.8 (L) 12.0 - 15.0 g/dL   HCT 02.5 (L) 85.2 - 77.8 %   MCV 87.1 80.0 - 100.0 fL   MCH 29.0 26.0 - 34.0 pg   MCHC 33.2 30.0 - 36.0 g/dL   RDW 24.2 35.3 - 61.4 %   Platelets 184 150 - 400 K/uL   nRBC 0.0 0.0 - 0.2 %   Neutrophils Relative % 83 %   Neutro Abs 7.5 1.7 - 7.7 K/uL   Lymphocytes Relative 12 %   Lymphs Abs 1.1 0.7 - 4.0 K/uL   Monocytes Relative 5 %   Monocytes Absolute 0.5 0.1 - 1.0 K/uL   Eosinophils Relative 0 %   Eosinophils Absolute 0.0 0.0 - 0.5 K/uL   Basophils Relative 0 %   Basophils Absolute 0.0 0.0 - 0.1 K/uL   Immature Granulocytes 0 %   Abs Immature Granulocytes 0.03 0.00 - 0.07 K/uL    Comment: Performed at [facility], [address] [place], [place] [address]  Glucose, capillary     Status: Abnormal   Collection Time: 05/14/22  7:47 AM   Result Value Ref Range   Glucose-Capillary 209 (H) 70 - 99 mg/dL    Comment: Glucose reference range applies only to samples taken after fasting for at least 8 hours.   Comment 1 Notify RN     CT ABDOMEN PELVIS W CONTRAST  Result Date: 05/13/2022 CLINICAL DATA:  Sepsis, pelvic cellulitis. EXAM: CT ABDOMEN AND PELVIS WITH CONTRAST TECHNIQUE: Multidetector CT imaging of the abdomen and pelvis was performed using the standard protocol following bolus administration of intravenous contrast. RADIATION DOSE REDUCTION: This exam was performed according to the departmental dose-optimization program which includes automated exposure control, adjustment of the mA and/or kV according to patient size and/or use of iterative reconstruction technique. CONTRAST:  22mL OMNIPAQUE IOHEXOL 300 MG/ML  SOLN COMPARISON:  CT abdomen and pelvis 04/07/2013 FINDINGS: Lower chest: No acute abnormality. Hepatobiliary: Gallstones are present. Bile ducts and liver appear within normal limits. Pancreas: Unremarkable. No pancreatic ductal dilatation or surrounding inflammatory changes. Spleen: Normal in size without focal abnormality. Adrenals/Urinary  Tract: Adrenal glands are unremarkable. Kidneys are normal, without renal calculi, focal lesion, or hydronephrosis. Bladder is unremarkable. Stomach/Bowel: Stomach is within normal limits. Appendix appears normal. No evidence of bowel wall thickening, distention, or inflammatory changes. Vascular/Lymphatic: No significant vascular findings are present. No enlarged abdominal or pelvic lymph nodes. Reproductive: Uterus and bilateral adnexa are unremarkable. Other: There is no ascites or free air. There is no focal abdominal wall hernia. There is subcutaneous edema in the region of the mons pubis and lower abdominal wall, left greater than right. There is left-sided wall thickening. There is an ill-defined fluid collection just beneath the skin surface in the lower left anterior abdominal wall  image 2/82 measuring 4.0 x 1.6 x 3.5 cm. There is no evidence for soft tissue gas. There are prominent bilateral inguinal lymph nodes, left greater than right. Musculoskeletal: No acute or significant osseous findings. IMPRESSION: 1. Subcutaneous edema in the mons pubis and lower abdominal wall concerning for cellulitis. 2. Ill-defined fluid collection just beneath the skin surface in the lower left anterior abdominal wall concerning for early abscess/phlegmon. 3. No evidence for soft tissue gas. 4. Prominent inguinal lymph nodes are likely reactive. 5. Cholelithiasis. Electronically Signed   By: Darliss Cheney M.D.   On: 05/13/2022 23:05    Review of Systems  Constitutional:  Positive for chills.  HENT: Negative.    Eyes: Negative.   Respiratory: Negative.    Cardiovascular: Negative.   Gastrointestinal: Negative.   Endocrine: Negative.   Genitourinary: Negative.   Musculoskeletal: Negative.   Skin:  Positive for color change and wound.  Allergic/Immunologic: Negative.   Neurological: Negative.   Hematological: Negative.   Psychiatric/Behavioral: Negative.      Physical Exam  Blood pressure 108/70, pulse 92, temperature 97.8 F (36.6 C), temperature source Oral, resp. rate 15, height 5' 2.01" (1.575 m), weight 66.9 kg, SpO2 98 %.  CONSTITUTIONAL: no acute distress; conversant; no obvious deformities  EYES: Conjunctiva clear and moist; pupils equal bilaterally  NECK: trachea midline; no thyroid nodularity  GI: abdomen is soft without distention; there is an area of erythema and induration in the low left lower quadrant; dressing is removed.  There is moderate purulent and serosanguineous drainage on the gauze.  There is 1/4 inch iodoform gauze wick present which goes into a small incision through the skin; there is no residual fluctuance.  MSK: normal range of motion of extremities; no clubbing; no cyanosis  PSYCH: appropriate affect for situation; alert and oriented to person,  place, & time  LYMPHATIC: no palpable cervical lymphadenopathy    Assessment/Plan:  Left lower quadrant abdominal wall abscess in type II diabetic patient  Agree with IV antibiotics per medical service for treatment of cellulitis  Incision and drainage performed by ER physician appears adequate at this time  Discussed with patient and her husband at the bedside   Drainage appears adequate as there is a moderate amount of output present on the dressings.  I change the dressings to fresh gauze bandages and left additional dressing supplies at the bedside.  Surgery will follow and check the wound tomorrow morning.  If this does not appear to be adequate drainage, she may require an operative procedure.  Darnell Level, MD [facility] practice Office: [phone]   Darnell Level 05/14/2022, 10:13 AM

## 2022-05-15 ENCOUNTER — Other Ambulatory Visit (HOSPITAL_COMMUNITY): Payer: Self-pay

## 2022-05-15 DIAGNOSIS — L03311 Cellulitis of abdominal wall: Secondary | ICD-10-CM | POA: Diagnosis not present

## 2022-05-15 DIAGNOSIS — Z794 Long term (current) use of insulin: Secondary | ICD-10-CM | POA: Diagnosis not present

## 2022-05-15 DIAGNOSIS — D649 Anemia, unspecified: Secondary | ICD-10-CM | POA: Diagnosis not present

## 2022-05-15 DIAGNOSIS — E1165 Type 2 diabetes mellitus with hyperglycemia: Secondary | ICD-10-CM | POA: Diagnosis not present

## 2022-05-15 LAB — CBC WITH DIFFERENTIAL/PLATELET
Abs Immature Granulocytes: 0.07 10*3/uL (ref 0.00–0.07)
Basophils Absolute: 0 10*3/uL (ref 0.0–0.1)
Basophils Relative: 1 %
Eosinophils Absolute: 0.2 10*3/uL (ref 0.0–0.5)
Eosinophils Relative: 4 %
HCT: 32.7 % — ABNORMAL LOW (ref 36.0–46.0)
Hemoglobin: 10.8 g/dL — ABNORMAL LOW (ref 12.0–15.0)
Immature Granulocytes: 1 %
Lymphocytes Relative: 29 %
Lymphs Abs: 1.7 10*3/uL (ref 0.7–4.0)
MCH: 29 pg (ref 26.0–34.0)
MCHC: 33 g/dL (ref 30.0–36.0)
MCV: 87.9 fL (ref 80.0–100.0)
Monocytes Absolute: 0.5 10*3/uL (ref 0.1–1.0)
Monocytes Relative: 9 %
Neutro Abs: 3.3 10*3/uL (ref 1.7–7.7)
Neutrophils Relative %: 56 %
Platelets: 200 10*3/uL (ref 150–400)
RBC: 3.72 MIL/uL — ABNORMAL LOW (ref 3.87–5.11)
RDW: 14.1 % (ref 11.5–15.5)
WBC: 5.8 10*3/uL (ref 4.0–10.5)
nRBC: 0 % (ref 0.0–0.2)

## 2022-05-15 LAB — RETICULOCYTES
Immature Retic Fract: 13.2 % (ref 2.3–15.9)
RBC.: 3.71 MIL/uL — ABNORMAL LOW (ref 3.87–5.11)
Retic Count, Absolute: 46.4 10*3/uL (ref 19.0–186.0)
Retic Ct Pct: 1.3 % (ref 0.4–3.1)

## 2022-05-15 LAB — COMPREHENSIVE METABOLIC PANEL
ALT: 21 U/L (ref 0–44)
AST: 25 U/L (ref 15–41)
Albumin: 2.3 g/dL — ABNORMAL LOW (ref 3.5–5.0)
Alkaline Phosphatase: 52 U/L (ref 38–126)
Anion gap: 6 (ref 5–15)
BUN: <5 mg/dL — ABNORMAL LOW (ref 6–20)
CO2: 23 mmol/L (ref 22–32)
Calcium: 8.6 mg/dL — ABNORMAL LOW (ref 8.9–10.3)
Chloride: 108 mmol/L (ref 98–111)
Creatinine, Ser: 0.33 mg/dL — ABNORMAL LOW (ref 0.44–1.00)
GFR, Estimated: >60 mL/min (ref >60)
Glucose, Bld: 177 mg/dL — ABNORMAL HIGH (ref 70–99)
Potassium: 3.6 mmol/L (ref 3.5–5.1)
Sodium: 137 mmol/L (ref 135–145)
Total Bilirubin: 0.1 mg/dL — ABNORMAL LOW (ref 0.3–1.2)
Total Protein: 5.7 g/dL — ABNORMAL LOW (ref 6.5–8.1)

## 2022-05-15 LAB — IRON AND TIBC
Iron: 17 ug/dL — ABNORMAL LOW (ref 28–170)
Saturation Ratios: 8 % — ABNORMAL LOW (ref 10.4–31.8)
TIBC: 205 ug/dL — ABNORMAL LOW (ref 250–450)
UIBC: 188 ug/dL

## 2022-05-15 LAB — MAGNESIUM: Magnesium: 1.6 mg/dL — ABNORMAL LOW (ref 1.7–2.4)

## 2022-05-15 LAB — GLUCOSE, CAPILLARY
Glucose-Capillary: 118 mg/dL — ABNORMAL HIGH (ref 70–99)
Glucose-Capillary: 190 mg/dL — ABNORMAL HIGH (ref 70–99)
Glucose-Capillary: 115 mg/dL — ABNORMAL HIGH (ref 70–99)

## 2022-05-15 LAB — FERRITIN: Ferritin: 80 ng/mL (ref 11–307)

## 2022-05-15 LAB — PHOSPHORUS: Phosphorus: 2.2 mg/dL — ABNORMAL LOW (ref 2.5–4.6)

## 2022-05-15 LAB — VITAMIN B12: Vitamin B-12: 416 pg/mL (ref 180–914)

## 2022-05-15 LAB — FOLATE: Folate: 17.5 ng/mL (ref >5.9)

## 2022-05-15 MED ORDER — INSULIN ISOPHANE & REGULAR (HUMAN 70-30)100 UNIT/ML KWIKPEN
35.0000 [IU] | PEN_INJECTOR | Freq: Two times a day (BID) | SUBCUTANEOUS | 1 refills | Status: DC
  Filled 2022-05-15: qty 15, 21d supply, fill #0
  Filled 2022-06-06: qty 15, 21d supply, fill #1

## 2022-05-15 MED ORDER — K PHOS MONO-SOD PHOS DI & MONO 155-852-130 MG PO TABS
500.0000 mg | ORAL_TABLET | Freq: Two times a day (BID) | ORAL | Status: DC
  Administered 2022-05-15: 500 mg via ORAL
  Filled 2022-05-15 (×2): qty 2

## 2022-05-15 MED ORDER — LIVING WELL WITH DIABETES BOOK - IN SPANISH
Freq: Once | Status: DC
  Filled 2022-05-15: qty 1

## 2022-05-15 MED ORDER — LINEZOLID 600 MG PO TABS
600.0000 mg | ORAL_TABLET | Freq: Two times a day (BID) | ORAL | 0 refills | Status: AC
  Filled 2022-05-15: qty 18, 9d supply, fill #0

## 2022-05-15 MED ORDER — MAGNESIUM SULFATE 2 GM/50ML IV SOLN
2.0000 g | Freq: Once | INTRAVENOUS | Status: AC
  Administered 2022-05-15: 2 g via INTRAVENOUS
  Filled 2022-05-15: qty 50

## 2022-05-15 MED ORDER — BD PEN NEEDLE NANO U/F 32G X 4 MM MISC
1.0000 | Freq: Two times a day (BID) | 0 refills | Status: AC
  Filled 2022-05-15: qty 100, 30d supply, fill #0

## 2022-05-15 MED ORDER — LINEZOLID 600 MG PO TABS
600.0000 mg | ORAL_TABLET | Freq: Two times a day (BID) | ORAL | Status: DC
  Filled 2022-05-15: qty 1

## 2022-05-15 NOTE — TOC Transition Note (Addendum)
dTransition of Care [facility]) - CM/SW Discharge Note   Patient Details  Name: Brenda Greer MRN: [id number] Date of Birth: June 15, 1986  Transition of [facility]) CM/SW Contact:  Otelia Santee, LCSW Phone Number: 05/15/2022, 2:24 PM   Clinical Narrative:    Consulted for home health needs for WOC. Pt is agreeable to [facility] services. [facility] is to provide [facility] services for WOC at discharge. Pt will need HH orders placed prior to discharge. No further TOC needs identified at this time. Please reconsult should further needs arise.    Final next level of care: Home w Home Health Services Barriers to Discharge: No Barriers Identified   Patient Goals and CMS Choice Patient states their goals for this hospitalization and ongoing recovery are:: To go home   Choice offered to / list presented to : Patient  Discharge Placement                       Discharge Plan and Services                DME Arranged: N/A DME Agency: NA       HH Arranged: RN HH Agency: Other - See comment ([facility]) Date HH Agency Contacted: 05/15/22 Time HH Agency Contacted: 1423 Representative spoke with at [facility] Agency: [facility]  Social Determinants of Health (SDOH) Interventions     Readmission Risk Interventions    05/15/2022    2:23 PM  Readmission Risk Prevention Plan  Post Dischage Appt Complete  Medication Screening Complete  Transportation Screening Complete

## 2022-05-15 NOTE — Discharge Summary (Signed)
Physician Discharge Summary   Patient: Brenda Greer MRN: [id number] DOB: 19-Sep-1986  Admit date:     05/13/2022  Discharge date: 05/15/22  Discharge Physician: Raiford Noble, DO   PCP: Ladell Pier, MD   Recommendations at discharge:   Follow-up with PCP within 1 to 2 weeks and repeat CBC, CMP, mag, Phos within 1 week Follow-up with general surgery within 1 to 2 weeks for outpatient wound check Continue dressing changes at home and continue with home health RN wound care  Discharge Diagnoses: Principal Problem:   Abdominal wall cellulitis Active Problems:   Type 2 diabetes mellitus with hyperglycemia ([place])  Resolved Problems:   * No resolved hospital problems. [facility] Course: The patient is a overweight Hispanic female with past medical history significant for but limited to diabetes mellitus type 2 and other comorbidities who presented with left lower quadrant abdominal wall swelling and erythema which gradually progressed over the last 72 hours.  She had left lower quadrant and infraumbilical area redness and erythema which significantly worsened after she shaved the area.  She denies any nausea vomiting or diarrhea and no vaginal discharge.  In the ED she had a CT scan done which showed concerning features for an abdominal wall cellulitis with possible early abscess.  The ED physician did an I&D at bedside and she was started on empiric antibiotics for her soft tissue infection.  Antibiotics were just de-escalated to p.o. Zyvox and surgery was consulted and evaluated the patient's wound and packed it.  Surgery felt that she had improved and was stable for discharge and recommending packing and dressing changes and they will follow-up with the patient in a few weeks.  The case was discussed with ID who recommended changing to p.o. Zyvox until 05/24/2022 and she will follow-up with her PCP and general surgery team outpatient setting.  Prior to discharge her insulin regimen  was adjusted given her hyperglycemia for better control.  She is improved and medically stable for discharge at this time.  Assessment and Plan:  Abdominal wall cellulitis with abscess -The abscess was I&D by the ED physician and cultures were sent which we will follow -We will continue with empiric antibiotics -Initially there was concern for necrotizing fasciitis and Zyvox was initiated but then Zosyn was also ordered -General surgery was consulted for further evaluation of the wound and will need to continue to monitor closely to see if further debridement is needed -CRP was elevated at 17.2 -She was given 2 L normal saline boluses and then started on maintenance IV fluid hydration at 125 MLS per hou with lactated Ringer's -WBC went from 11.3 is now 9.1 yesterday and today is now 5.8 -Follow cultures and monitor temperature curve and WBC count -After further discussion we will just de-escalate antibiotics to p.o. Zyvox 600 mg p.o. twice daily until 05/24/2022 at the recommendations of ID -Wound culture showed Staphylococcus aureus that was abundant that was pansensitive   Hypokalemia -Mild.  Patient's potassium on admission was 3.6 -Continue to monitor and replete as necessary -Check mag level in a.m. -Repeat CMP in the a.m.  Hypophosphatemia -Phos level was 2.2 and will replete prior to discharge -Repeat Phos level in 1 week  Hypomagnesemia -Mag level was 1.6 and replete with IV mag sulfate 2 g prior to discharge -Repeat mag level in 1 week   Normocytic anemia -Patient's hemoglobin/hematocrit went from 12.2/35.5 is now 10.8/32.5 yesterday and today is 10.8/32.7 -Anemia panel was checked and showed iron level of 17,  UIBC 188, TIBC 205, saturation ratios of 8%, ferritin level 80, folate level 17.5 and a vitamin B12 of 416. -Continue to monitor for signs or symptoms of bleeding -Repeat CBC within 1 week   Elevated AST -Mild and likely reactive = AST went from 66 is now 25 and  improved -Continue monitor and trend and repeat CMP in a.m. and if necessary will obtain a right upper quadrant ultrasound Acute hepatitis panel -Repeat CMP within 1 week   Hypoalbuminemia -Patient's albumin level is now 2.6 yesterday and today is 2.3 -Continue monitor and trend and repeat CMP within 1 week   Uncontrolled diabetes mellitus type 2 -Currently not in DKA and per her last PCP note in February 2023 her hemoglobin A1c was 13 -Normally she is on NovoLog 70/30 25 units twice daily but this has been changed to 35 units twice daily now for discharge -Repeat hemoglobin A1c was 12.9 -Continue with sensitive NovoLog/scale insulin AC along with insulin NPH 24 units twice daily while hospitalized with further blood sugar adjustments -Continue monitor blood sugars carefully per protocol and she may need diabetes education coordinator evaluation -CBGs ranging from 500-938   Metabolic acidosis -Mild -Patient had a CO2 of 21, anion gap of 8, chloride level of 108 and now improved and has a CO2 of 23, anion gap of 6, chloride level 108 -Continue with IV fluid hydration as above -Continue to monitor and trend and repeat CMP in a.m.  Consultants: General surgery; discussed the case with ID Procedures performed: I&D Disposition: Home Diet recommendation:  Discharge Diet Orders (From admission, onward)     Start     Ordered   05/15/22 0000  Diet - low sodium heart healthy        05/15/22 1456   05/15/22 0000  Diet Carb Modified        05/15/22 1456           Carb modified diet DISCHARGE MEDICATION: Allergies as of 05/15/2022   No Known Allergies      Medication List     STOP taking these medications    insulin NPH Human 100 UNIT/ML injection Commonly known as: NOVOLIN N   INSULIN SYRINGE 1CC/31GX5/16" 31G X 5/16" 1 ML Misc       TAKE these medications    Accu-Chek Guide test strip Generic drug: glucose blood Test Blood sugars twice a day before meals.    Accu-Chek Softclix Lancets lancets Use as instructed   acetaminophen 500 MG tablet Commonly known as: TYLENOL Take 1,000 mg by mouth every 6 (six) hours as needed (pain).   BD Pen Needle Nano U/F 32G X 4 MM Misc Generic drug: Insulin Pen Needle Use to inject insulin in the morning and bedtime   insulin isophane & regular human KwikPen (70-30) 100 UNIT/ML KwikPen Commonly known as: HUMULIN 70/30 MIX Inject 35 Units into the skin in the morning and at bedtime.   linezolid 600 MG tablet Commonly known as: ZYVOX Take 1 tablet (600 mg total) by mouth every 12 (twelve) hours for 9 days.   metFORMIN 500 MG 24 hr tablet Commonly known as: GLUCOPHAGE-XR TAKE 2 TABLETS BY MOUTH TWICE DAILY WITH A MEAL What changed:  how much to take how to take this when to take this additional instructions   OneTouch Verio w/Device Kit Use as instructed to check blood sugar three times daily. E11.42   Accu-Chek Guide w/Device Kit UAD   Ozempic (0.25 or 0.5 MG/DOSE) 2 MG/1.5ML Sopn Generic drug: Semaglutide(0.25  or 0.'5MG'$ /DOS) Inject 0.25 mg into the skin once a week.   pravastatin 20 MG tablet Commonly known as: PRAVACHOL Take 20 mg by mouth at bedtime.               Discharge Care Instructions  (From admission, onward)           Start     Ordered   05/15/22 0000  Discharge wound care:       Comments: Per Surgery   05/15/22 1456            Follow-up Information     Surgery, Gila. Go on 06/06/2022.   Specialty: General Surgery Why: For wound re-check@ 9:15 AM. Please arrive 30 min prior to appointment time. Please have ID and any insurance information with you. Contact information: [address] [place] [address] [phone]                Discharge Exam: [facility] Weights   05/14/22 0108  Weight: 66.9 kg   Vitals:   05/15/22 0358 05/15/22 1341  BP: 110/79 121/79  Pulse: 71 86  Resp: 18 16  Temp: (!) 97.4 F (36.3 C) 98.7 F  (37.1 C)  SpO2: 98% 99%   Examination: Physical Exam:  Constitutional: WN/WD overweight hispanic female in NAD Respiratory: Diminished to auscultation bilaterally, no wheezing, rales, rhonchi or crackles. Normal respiratory effort and patient is not tachypenic. No accessory muscle use.  Unlabored breathing Cardiovascular: RRR, no murmurs / rubs / gallops. S1 and S2 auscultated. No extremity edema.  Abdomen: Soft, non-tender, distended secondary body habitus and left-sided abdominal wound noted and packing has been removed by the surgery team. Bowel sounds positive.  GU: Deferred. Musculoskeletal: No clubbing / cyanosis of digits/nails. No joint deformity upper and lower extremities.  Skin: Left lower quadrant area noted with I&D and is painful Neurologic: CN 2-12 grossly intact with no focal deficits. Romberg sign cerebellar reflexes not assessed.  Psychiatric: Normal judgment and insight. Alert and oriented x 3. Normal mood and appropriate affect.   Condition at discharge: stable  The results of significant diagnostics from this hospitalization (including imaging, microbiology, ancillary and laboratory) are listed below for reference.   Imaging Studies: CT ABDOMEN PELVIS W CONTRAST  Result Date: 05/13/2022 CLINICAL DATA:  Sepsis, pelvic cellulitis. EXAM: CT ABDOMEN AND PELVIS WITH CONTRAST TECHNIQUE: Multidetector CT imaging of the abdomen and pelvis was performed using the standard protocol following bolus administration of intravenous contrast. RADIATION DOSE REDUCTION: This exam was performed according to the departmental dose-optimization program which includes automated exposure control, adjustment of the mA and/or kV according to patient size and/or use of iterative reconstruction technique. CONTRAST:  33mL OMNIPAQUE IOHEXOL 300 MG/ML  SOLN COMPARISON:  CT abdomen and pelvis 04/07/2013 FINDINGS: Lower chest: No acute abnormality. Hepatobiliary: Gallstones are present. Bile ducts and  liver appear within normal limits. Pancreas: Unremarkable. No pancreatic ductal dilatation or surrounding inflammatory changes. Spleen: Normal in size without focal abnormality. Adrenals/Urinary Tract: Adrenal glands are unremarkable. Kidneys are normal, without renal calculi, focal lesion, or hydronephrosis. Bladder is unremarkable. Stomach/Bowel: Stomach is within normal limits. Appendix appears normal. No evidence of bowel wall thickening, distention, or inflammatory changes. Vascular/Lymphatic: No significant vascular findings are present. No enlarged abdominal or pelvic lymph nodes. Reproductive: Uterus and bilateral adnexa are unremarkable. Other: There is no ascites or free air. There is no focal abdominal wall hernia. There is subcutaneous edema in the region of the mons pubis and lower abdominal wall,  left greater than right. There is left-sided wall thickening. There is an ill-defined fluid collection just beneath the skin surface in the lower left anterior abdominal wall image 2/82 measuring 4.0 x 1.6 x 3.5 cm. There is no evidence for soft tissue gas. There are prominent bilateral inguinal lymph nodes, left greater than right. Musculoskeletal: No acute or significant osseous findings. IMPRESSION: 1. Subcutaneous edema in the mons pubis and lower abdominal wall concerning for cellulitis. 2. Ill-defined fluid collection just beneath the skin surface in the lower left anterior abdominal wall concerning for early abscess/phlegmon. 3. No evidence for soft tissue gas. 4. Prominent inguinal lymph nodes are likely reactive. 5. Cholelithiasis. Electronically Signed   By: Ronney Asters M.D.   On: 05/13/2022 23:05    Microbiology: Results for orders placed or performed during the hospital encounter of 05/13/22  Culture, blood (single)     Status: None (Preliminary result)   Collection Time: 05/13/22 10:25 PM   Specimen: BLOOD  Result Value Ref Range Status   Specimen Description   Final    BLOOD BLOOD  RIGHT WRIST Performed at [facility] [address] [place], [place] [address]    Special Requests   Final    BOTTLES DRAWN AEROBIC AND ANAEROBIC Blood Culture adequate volume Performed at [facility] [address] [place], [place] [address]    Culture   Final    NO GROWTH 1 DAY Performed at [facility], [place] [address], [place], [place] [address]    Report Status PENDING  Incomplete  Culture, blood (routine x 2)     Status: None (Preliminary result)   Collection Time: 05/13/22 10:30 PM   Specimen: BLOOD  Result Value Ref Range Status   Specimen Description   Final    BLOOD BLOOD LEFT FOREARM Performed at [facility] [address], [place], [place] [address]    Special Requests   Final    BOTTLES DRAWN AEROBIC AND ANAEROBIC Blood Culture adequate volume Performed at [facility] [address], [place], [place] [address]    Culture   Final    NO GROWTH 1 DAY Performed at [facility], [place] [address] [place],  [address]    Report Status PENDING  Incomplete  Aerobic Culture w Gram Stain (superficial specimen)     Status: None (Preliminary result)   Collection Time: 05/14/22 12:05 AM   Specimen: Other Source  Result Value Ref Range Status   Specimen Description OTHER  Final   Special Requests NONE  Final   Gram Stain   Final    NO SQUAMOUS EPITHELIAL CELLS SEEN FEW WBC SEEN FEW GRAM POSITIVE COCCI    Culture   Final    ABUNDANT STAPHYLOCOCCUS AUREUS SUSCEPTIBILITIES TO FOLLOW Performed at [facility], [place] [address] [place],  [address]    Report Status PENDING  Incomplete   Labs: CBC: Recent Labs  Lab 05/13/22 2224 05/14/22 0527 05/15/22 0454  WBC 11.3* 9.1 5.8  NEUTROABS 8.6* 7.5 3.3  HGB 12.2 10.8* 10.8*  HCT 35.5* 32.5* 32.7*  MCV 85.7 87.1 87.9  PLT 202 184 111   Basic Metabolic Panel: Recent Labs  Lab 05/13/22 2224 05/14/22 0527  05/15/22 0454  NA 138 137 137  K 3.3* 3.4* 3.6  CL 107 108 108  CO2 21* 21* 23  GLUCOSE 377* 258* 177*  BUN 6 <5* <5*  CREATININE 0.45 <0.30* 0.33*  CALCIUM 9.0  7.6* 8.6*  MG  --   --  1.6*  PHOS  --   --  2.2*   Liver Function Tests: Recent Labs  Lab 05/14/22 0527 05/15/22 0454  AST 66* 25  ALT 27 21  ALKPHOS 55 52  BILITOT 0.7 0.1*  PROT 5.9* 5.7*  ALBUMIN 2.6* 2.3*   CBG: Recent Labs  Lab 05/14/22 1644 05/14/22 2120 05/15/22 0726 05/15/22 1109 05/15/22 1620  GLUCAP 222* 296* 118* 190* 115*    Discharge time spent: greater than 30 minutes.  Signed: Raiford Noble, DO [facility] 05/15/2022

## 2022-05-15 NOTE — Progress Notes (Signed)
Progress Note     Subjective: Pt reports area in left groin that was drained is overall improving. Husband at bedside and translated. Reviewed wound care instructions for packing and recommend BID dressing changes acutely.   Objective: Vital signs in last 24 hours: Temp:  [97.4 F (36.3 C)-98.1 F (36.7 C)] 97.4 F (36.3 C) (07/31 0358) Pulse Rate:  [71-105] 71 (07/31 0358) Resp:  [18] 18 (07/31 0358) BP: (110-116)/(70-79) 110/79 (07/31 0358) SpO2:  [97 %-98 %] 98 % (07/31 0358) Last BM Date : 05/14/22  Intake/Output from previous day: 07/30 0701 - 07/31 0700 In: 4232.9 [P.O.:840; I.V.:2612.1; IV Piggyback:780.8] Out: -  Intake/Output this shift: No intake/output data recorded.  PE: General: pleasant, WD female who is laying in bed in NAD Lungs:  Respiratory effort nonlabored Abd: soft, NT, ND GU: left mons with some cellulitis and central open wound, cavity measures ~2.5 cm, bloody drainage, tenderness with packing, some necrotic appearing tissue around wound Psych: A&Ox3 with an appropriate affect.    Lab Results:  Recent Labs    05/14/22 0527 05/15/22 0454  WBC 9.1 5.8  HGB 10.8* 10.8*  HCT 32.5* 32.7*  PLT 184 200   BMET Recent Labs    05/14/22 0527 05/15/22 0454  NA 137 137  K 3.4* 3.6  CL 108 108  CO2 21* 23  GLUCOSE 258* 177*  BUN <5* <5*  CREATININE <0.30* 0.33*  CALCIUM 7.6* 8.6*   PT/INR No results for input(s): "LABPROT", "INR" in the last 72 hours. CMP     Component Value Date/Time   NA 137 05/15/2022 0454   NA 138 11/18/2021 1037   K 3.6 05/15/2022 0454   CL 108 05/15/2022 0454   CO2 23 05/15/2022 0454   GLUCOSE 177 (H) 05/15/2022 0454   BUN <5 (L) 05/15/2022 0454   BUN 8 11/18/2021 1037   CREATININE 0.33 (L) 05/15/2022 0454   CREATININE 0.60 05/19/2013 1703   CALCIUM 8.6 (L) 05/15/2022 0454   PROT 5.7 (L) 05/15/2022 0454   PROT 7.1 11/18/2021 1037   ALBUMIN 2.3 (L) 05/15/2022 0454   ALBUMIN 4.2 11/18/2021 1037   AST 25  05/15/2022 0454   ALT 21 05/15/2022 0454   ALKPHOS 52 05/15/2022 0454   BILITOT 0.1 (L) 05/15/2022 0454   BILITOT 0.4 11/18/2021 1037   GFRNONAA >60 05/15/2022 0454   GFRNONAA >89 05/19/2013 1703   GFRAA 149 11/25/2019 0918   GFRAA >89 05/19/2013 1703   Lipase     Component Value Date/Time   LIPASE 19 11/07/2018 1222       Studies/Results: CT ABDOMEN PELVIS W CONTRAST  Result Date: 05/13/2022 CLINICAL DATA:  Sepsis, pelvic cellulitis. EXAM: CT ABDOMEN AND PELVIS WITH CONTRAST TECHNIQUE: Multidetector CT imaging of the abdomen and pelvis was performed using the standard protocol following bolus administration of intravenous contrast. RADIATION DOSE REDUCTION: This exam was performed according to the departmental dose-optimization program which includes automated exposure control, adjustment of the mA and/or kV according to patient size and/or use of iterative reconstruction technique. CONTRAST:  75mL OMNIPAQUE IOHEXOL 300 MG/ML  SOLN COMPARISON:  CT abdomen and pelvis 04/07/2013 FINDINGS: Lower chest: No acute abnormality. Hepatobiliary: Gallstones are present. Bile ducts and liver appear within normal limits. Pancreas: Unremarkable. No pancreatic ductal dilatation or surrounding inflammatory changes. Spleen: Normal in size without focal abnormality. Adrenals/Urinary Tract: Adrenal glands are unremarkable. Kidneys are normal, without renal calculi, focal lesion, or hydronephrosis. Bladder is unremarkable. Stomach/Bowel: Stomach is within normal limits. Appendix appears normal.  No evidence of bowel wall thickening, distention, or inflammatory changes. Vascular/Lymphatic: No significant vascular findings are present. No enlarged abdominal or pelvic lymph nodes. Reproductive: Uterus and bilateral adnexa are unremarkable. Other: There is no ascites or free air. There is no focal abdominal wall hernia. There is subcutaneous edema in the region of the mons pubis and lower abdominal wall, left greater  than right. There is left-sided wall thickening. There is an ill-defined fluid collection just beneath the skin surface in the lower left anterior abdominal wall image 2/82 measuring 4.0 x 1.6 x 3.5 cm. There is no evidence for soft tissue gas. There are prominent bilateral inguinal lymph nodes, left greater than right. Musculoskeletal: No acute or significant osseous findings. IMPRESSION: 1. Subcutaneous edema in the mons pubis and lower abdominal wall concerning for cellulitis. 2. Ill-defined fluid collection just beneath the skin surface in the lower left anterior abdominal wall concerning for early abscess/phlegmon. 3. No evidence for soft tissue gas. 4. Prominent inguinal lymph nodes are likely reactive. 5. Cholelithiasis. Electronically Signed   By: Darliss Cheney M.D.   On: 05/13/2022 23:05    Anti-infectives: Anti-infectives (From admission, onward)    Start     Dose/Rate Route Frequency Ordered Stop   05/14/22 0600  piperacillin-tazobactam (ZOSYN) IVPB 3.375 g  Status:  Discontinued        3.375 g 12.5 mL/hr over 240 Minutes Intravenous Every 8 hours 05/14/22 0257 05/15/22 1142   05/13/22 2245  piperacillin-tazobactam (ZOSYN) IVPB 3.375 g        3.375 g 100 mL/hr over 30 Minutes Intravenous  Once 05/13/22 2231 05/13/22 2355   05/13/22 2230  piperacillin-tazobactam (ZOSYN) IVPB 3.375 g  Status:  Discontinued        3.375 g 100 mL/hr over 30 Minutes Intravenous  Once 05/13/22 2225 05/13/22 2227   05/13/22 2230  linezolid (ZYVOX) IVPB 600 mg        600 mg 300 mL/hr over 60 Minutes Intravenous Every 12 hours 05/13/22 2225     05/13/22 2230  piperacillin-tazobactam (ZOSYN) IVPB 4.5 g  Status:  Discontinued        4.5 g 133.3 mL/hr over 30 Minutes Intravenous  Once 05/13/22 2227 05/13/22 2231        Assessment/Plan  Left mons abscess s/p I&D by EDP  - appears adequately drained on my exam today, some necrosis of surrounding tissue and expect that this may slough some over the next few  days - discussed loosely packing BID with patient and her husband - can pack saline moistened corner of gauze into wound and then cover with dry dressing - Cx with Staph aureus, sensitivities pending - leukocytosis improving and afebrile - no further surgical intervention needed, general surgery will sign off at this time. She can follow up for wound check in [facility] clinic in 3 weeks   FEN: CM diet, SLIV VTE: SCDs ID: linezolid  - below per TRH -  T2DM, uncontrolled   LOS: 1 day   I reviewed hospitalist notes, last 24 h vitals and pain scores, last 48 h intake and output, last 24 h labs and trends, and last 24 h imaging results.     Juliet Rude, [facility] 05/15/2022, 12:48 PM Please see Amion for pager number during day hours 7:00am-4:30pm

## 2022-05-15 NOTE — Plan of Care (Signed)

## 2022-05-15 NOTE — Inpatient Diabetes Management (Signed)
Inpatient Diabetes Program Recommendations  AACE/ADA: New Consensus Statement on Inpatient Glycemic Control (2015)  Target Ranges:  Prepandial:   less than 140 mg/dL      Peak postprandial:   less than 180 mg/dL (1-2 hours)      Critically ill patients:  140 - 180 mg/dL   Lab Results  Component Value Date   GLUCAP 190 (H) 05/15/2022   HGBA1C 12.9 (H) 05/14/2022    Review of Glycemic Control  Diabetes history: DM2 Outpatient Diabetes medications: Novolin N 25 units bid Current orders for Inpatient glycemic control: NPH 25 units am & hs, Novolog 0-9 units tid  Inpatient Diabetes Program Recommendations:   Spoke with pt and husband (interpretor) about A1C results 12.9 (average blood glucose over the past 2-3 months 323) and explained what an A1C is, basic pathophysiology of DM Type 2, basic home care, basic diabetes diet nutrition principles, importance of checking CBGs and maintaining good CBG control to prevent long-term and short-term complications. Reviewed signs and symptoms of hyperglycemia and hypoglycemia and how to treat hypoglycemia at home. Also reviewed blood sugar goals at home.  RNs to provide ongoing basic DM education at bedside with this patient.  Ordered Living Well With Diabetes in Spanish for patient review with husband.  Discussed option of Novolin 70/30 insulin with Dr. Marland Mcalpine. Patient may improved with 70/30 insulin to have some meal coverage. Patient only drinks water but eats rice with her meals. Discussed limiting rice to not more than a cup @ a time along with green vegetables and lean meats. Husband and wife acknowledge understanding of 70/30 insulin timing and reviewed hypoglycemia protocol again with patient.  Novolin Relion 70/30 insulin 35 units bid would equal 49 units basal + 21 units meal coverage.  Thank you, Billy Fischer. Issac Moure, RN, MSN, CDE  Diabetes Coordinator Inpatient Glycemic Control Team Team Pager [phone] (8am-5pm) 05/15/2022 4:19 PM

## 2022-05-15 NOTE — Progress Notes (Signed)
PHARMACY NOTE -  Zosyn  Pharmacy has been assisting with dosing of Zosyn for cellulitis. Dosage remains stable at 3.375 g IV q8 hr and further renal adjustments per institutional Pharmacy antibiotic protocol   Pharmacy will sign off, following peripherally for culture results or dose adjustments. Please reconsult if a change in clinical status warrants re-evaluation of dosage.  Bernadene Person, PharmD, BCPS [phone] 05/15/2022, 10:26 AM

## 2022-05-16 ENCOUNTER — Other Ambulatory Visit (HOSPITAL_COMMUNITY): Payer: Self-pay

## 2022-05-16 LAB — AEROBIC CULTURE W GRAM STAIN (SUPERFICIAL SPECIMEN): Gram Stain: NONE SEEN

## 2022-05-19 LAB — CULTURE, BLOOD (SINGLE)
Culture: NO GROWTH
Special Requests: ADEQUATE

## 2022-05-19 LAB — CULTURE, BLOOD (ROUTINE X 2)
Culture: NO GROWTH
Special Requests: ADEQUATE

## 2022-06-07 ENCOUNTER — Other Ambulatory Visit (HOSPITAL_COMMUNITY): Payer: Self-pay

## 2022-06-13 ENCOUNTER — Telehealth: Payer: Self-pay | Admitting: Internal Medicine

## 2022-06-13 ENCOUNTER — Ambulatory Visit: Payer: Commercial Managed Care - PPO | Attending: Internal Medicine | Admitting: Internal Medicine

## 2022-06-13 VITALS — BP 121/80 | HR 102 | Ht 64.0 in | Wt 145.2 lb

## 2022-06-13 DIAGNOSIS — D649 Anemia, unspecified: Secondary | ICD-10-CM | POA: Diagnosis not present

## 2022-06-13 DIAGNOSIS — Z23 Encounter for immunization: Secondary | ICD-10-CM

## 2022-06-13 DIAGNOSIS — E1165 Type 2 diabetes mellitus with hyperglycemia: Secondary | ICD-10-CM

## 2022-06-13 DIAGNOSIS — Z09 Encounter for follow-up examination after completed treatment for conditions other than malignant neoplasm: Secondary | ICD-10-CM

## 2022-06-13 DIAGNOSIS — E785 Hyperlipidemia, unspecified: Secondary | ICD-10-CM

## 2022-06-13 DIAGNOSIS — E1169 Type 2 diabetes mellitus with other specified complication: Secondary | ICD-10-CM | POA: Diagnosis not present

## 2022-06-13 DIAGNOSIS — Z794 Long term (current) use of insulin: Secondary | ICD-10-CM

## 2022-06-13 MED ORDER — PRAVASTATIN SODIUM 20 MG PO TABS: 20.0000 mg | ORAL_TABLET | Freq: Every day | ORAL | 1 refills | Status: DC

## 2022-06-13 MED ORDER — ONETOUCH DELICA LANCETS 33G MISC: 2 refills | Status: DC

## 2022-06-13 MED ORDER — METFORMIN HCL ER 500 MG PO TB24
1000.0000 mg | ORAL_TABLET | Freq: Two times a day (BID) | ORAL | 5 refills | Status: DC
Start: 2022-06-13 — End: 2022-07-03

## 2022-06-13 MED ORDER — INSULIN ISOPHANE & REGULAR (HUMAN 70-30)100 UNIT/ML KWIKPEN: 35.0000 [IU] | PEN_INJECTOR | Freq: Two times a day (BID) | SUBCUTANEOUS | 1 refills | Status: DC

## 2022-06-13 MED ORDER — FERROUS SULFATE 325 (65 FE) MG PO TABS: 325.0000 mg | ORAL_TABLET | Freq: Every day | ORAL | 0 refills | Status: DC

## 2022-06-13 NOTE — Progress Notes (Signed)
Patient ID: Brenda Greer, female    DOB: 1986/06/16  MRN: [id number]  CC: Hospitalization Follow-up   Subjective: Brenda Greer is a 36 y.o. female who presents for hosp f/u Her concerns today include:  Pt has DM, HL, GERD,  Iron def anemia, migraines without aura.   Patient was hospitalized 7/29-31/2023 with abdominal wall cellulitis.  Wound culture grew Staph aureus.  She was discharged home on Zyvox 600 mg twice a day until 05/24/2022.  She completed the antibiotics.  Symptoms have resolved.  A1c found to be 12.9. She was discharged home on metformin 1 g twice a day, Humulin 70/30 35 units twice a day.  Reports compliance with taking these medications.  Off Ozempic x4 months.  She was unable to afford.  Not checking blood sugars.  Out of lancets for One Touch Verio. She feels she is doing okay with eating habits. Out of Pravachol x3 months.  She requests refill be sent to her pharmacy.  Patient with history of iron deficiency anemia.  During recent hospitalization, hemoglobin went from 12.2-10.8.  Ferritin level was 80/iron 17/8% saturated/TIBC 205.  She denies any dizziness..  Patient Active Problem List   Diagnosis Date Noted   Type 2 diabetes mellitus with hyperglycemia (HCC) 05/14/2022   Abdominal wall cellulitis 05/13/2022   History of herpes genitalis 11/18/2021   Microalbuminuria due to type 2 diabetes mellitus (HCC) 01/22/2021   COVID-19 vaccine series completed 09/21/2020   Uncontrolled type 2 diabetes mellitus with peripheral neuropathy 07/18/2019   Migraine without aura and without status migrainosus, not intractable 07/18/2019   Hyperlipidemia associated with type 2 diabetes mellitus (HCC) 07/18/2019   Iron deficiency anemia due to chronic blood loss 07/02/2018   Hyperlipidemia 02/26/2018   Gastroesophageal reflux disease without esophagitis 02/26/2018   Diabetes mellitus without complication (HCC) 04/09/2017   Type II or unspecified type  diabetes mellitus without mention of complication, uncontrolled 05/19/2013   OBESITY, NOS 12/13/2006     Current Outpatient Medications on File Prior to Visit  Medication Sig Dispense Refill   acetaminophen (TYLENOL) 500 MG tablet Take 1,000 mg by mouth every 6 (six) hours as needed (pain).     Blood Glucose Monitoring Suppl (ONETOUCH VERIO) w/Device KIT Use as instructed to check blood sugar three times daily. E11.42 1 kit 0   Insulin Pen Needle (BD PEN NEEDLE NANO U/F) 32G X 4 MM MISC Use to inject insulin in the morning and bedtime 100 each 0   No current facility-administered medications on file prior to visit.    No Known Allergies  Social History   Socioeconomic History   Marital status: Single    Spouse name: Not on file   Number of children: Not on file   Years of education: Not on file   Highest education level: Not on file  Occupational History   Not on file  Tobacco Use   Smoking status: Never   Smokeless tobacco: Never  Substance and Sexual Activity   Alcohol use: No   Drug use: No   Sexual activity: Not on file  Other Topics Concern   Not on file  Social History Narrative   Not on file   Social Determinants of Health   Financial Resource Strain: Not on file  Food Insecurity: Not on file  Transportation Needs: Not on file  Physical Activity: Not on file  Stress: Not on file  Social Connections: Not on file  Intimate Partner Violence: Not on file    Family History  Problem Relation Age of Onset   Diabetes Mother    Hypertension Mother     No past surgical history on file.  ROS: Review of Systems Negative except as stated above  PHYSICAL EXAM: BP 121/80   Pulse (!) 102   Ht $R'5\' 4"'oE$  (1.626 m)   Wt 145 lb 3.2 oz (65.9 kg)   LMP 06/12/2022   SpO2 100%   BMI 24.92 kg/m   Physical Exam  General appearance - alert, well appearing, middle-aged Hispanic female and in no distress Mental status - normal mood, behavior, speech, dress, motor  activity, and thought processes Chest - clear to auscultation, no wheezes, rales or rhonchi, symmetric air entry Heart - normal rate, regular rhythm, normal S1, S2, no murmurs, rubs, clicks or gallops Abdomen - soft, nontender, nondistended, no masses or organomegaly Extremities - peripheral pulses normal, no pedal edema, no clubbing or cyanosis      Latest Ref Rng & Units 05/15/2022    4:54 AM 05/14/2022    5:27 AM 05/13/2022   10:24 PM  CMP  Glucose 70 - 99 mg/dL 177  258  377   BUN 6 - 20 mg/dL <5  <5  6   Creatinine 0.44 - 1.00 mg/dL 0.33  <0.30  0.45   Sodium 135 - 145 mmol/L 137  137  138   Potassium 3.5 - 5.1 mmol/L 3.6  3.4  3.3   Chloride 98 - 111 mmol/L 108  108  107   CO2 22 - 32 mmol/L $RemoveB'23  21  21   'LRVSjAxh$ Calcium 8.9 - 10.3 mg/dL 8.6  7.6  9.0   Total Protein 6.5 - 8.1 g/dL 5.7  5.9    Total Bilirubin 0.3 - 1.2 mg/dL 0.1  0.7    Alkaline Phos 38 - 126 U/L 52  55    AST 15 - 41 U/L 25  66    ALT 0 - 44 U/L 21  27     Lipid Panel     Component Value Date/Time   CHOL 222 (H) 11/18/2021 1037   TRIG 116 11/18/2021 1037   HDL 66 11/18/2021 1037   CHOLHDL 3.4 11/18/2021 1037   CHOLHDL 4.2 05/19/2013 1703   VLDL 38 05/19/2013 1703   LDLCALC 136 (H) 11/18/2021 1037    CBC    Component Value Date/Time   WBC 5.8 05/15/2022 0454   RBC 3.72 (L) 05/15/2022 0454   RBC 3.71 (L) 05/15/2022 0454   HGB 10.8 (L) 05/15/2022 0454   HGB 12.8 11/18/2021 1037   HCT 32.7 (L) 05/15/2022 0454   HCT 39.8 11/18/2021 1037   PLT 200 05/15/2022 0454   PLT 226 11/18/2021 1037   MCV 87.9 05/15/2022 0454   MCV 87 11/18/2021 1037   MCH 29.0 05/15/2022 0454   MCHC 33.0 05/15/2022 0454   RDW 14.1 05/15/2022 0454   RDW 13.9 11/18/2021 1037   LYMPHSABS 1.7 05/15/2022 0454   MONOABS 0.5 05/15/2022 0454   EOSABS 0.2 05/15/2022 0454   BASOSABS 0.0 05/15/2022 0454    ASSESSMENT AND PLAN: 1. Hospital discharge follow-up Doing well and has not had any recurrence of abdominal wall  cellulitis.  2. Type 2 diabetes mellitus with hyperglycemia, with long-term current use of insulin (HCC) Recent A1c not at goal.  Patient currently not checking blood sugars.  She is in need of lancets.  Prescription will be sent to her pharmacy.  Advised to check blood sugars twice a day before meals.  I will have  her follow-up with the clinical pharmacist in about 3 weeks.  Advised to bring her readings with her.  In the meantime we will continue current dose of metformin and Humulin 70/30 35 units twice a day. Discussed on encourage healthy eating habits - metFORMIN (GLUCOPHAGE-XR) 500 MG 24 hr tablet; Take 2 tablets (1,000 mg total) by mouth 2 (two) times daily with a meal.  Dispense: 120 tablet; Refill: 5 - insulin isophane & regular human KwikPen (HUMULIN 70/30 MIX) (70-30) 100 UNIT/ML KwikPen; Inject 35 Units into the skin in the morning and at bedtime.  Dispense: 15 mL; Refill: 1 - Ambulatory referral to Ophthalmology  3. Chronic anemia More consistent with anemia of chronic disease but would benefit from being on iron supplement given iron and percent saturation level.  I forgot to tell the patient this before she left today.  I will have my CMA call her and let her know that I recommend she starts taking iron supplement.  Prescription sent to her pharmacy.  4. Hyperlipidemia associated with type 2 diabetes mellitus (HCC) Refill Pravachol. - pravastatin (PRAVACHOL) 20 MG tablet; Take 1 tablet (20 mg total) by mouth at bedtime.  Dispense: 90 tablet; Refill: 1  5. Need for immunization against influenza - Flu Vaccine QUAD 44mo+IM (Fluarix, Fluzone & Alfiuria Quad PF)   [employer] Language interpreter used during this encounter. #[id number], [place]  Patient was given the opportunity to ask questions.  Patient verbalized understanding of the plan and was able to repeat key elements of the plan.   This documentation was completed using Radio producer.  Any transcriptional errors  are unintentional.  Orders Placed This Encounter  Procedures   Flu Vaccine QUAD 53mo+IM (Fluarix, Fluzone & Alfiuria Quad PF)   Ambulatory referral to Ophthalmology     Requested Prescriptions   Signed Prescriptions Disp Refills   pravastatin (PRAVACHOL) 20 MG tablet 90 tablet 1    Sig: Take 1 tablet (20 mg total) by mouth at bedtime.   metFORMIN (GLUCOPHAGE-XR) 500 MG 24 hr tablet 120 tablet 5    Sig: Take 2 tablets (1,000 mg total) by mouth 2 (two) times daily with a meal.   insulin isophane & regular human KwikPen (HUMULIN 70/30 MIX) (70-30) 100 UNIT/ML KwikPen 15 mL 1    Sig: Inject 35 Units into the skin in the morning and at bedtime.   OneTouch Delica Lancets 56L MISC 100 each 2    Sig: Use as instructed to check blood sugar three times daily. E11.42    Return in about 4 months (around 10/13/2022) for Appt with [facility] in 3 wks for BS check.  Karle Plumber, MD, FACP

## 2022-06-13 NOTE — Progress Notes (Signed)
Pt requesting RF on Metformin.

## 2022-06-14 NOTE — Telephone Encounter (Signed)
Pt informed Rx sent to pharmacy. Pt expressed understanding. ------DD,RMA  

## 2022-07-03 ENCOUNTER — Other Ambulatory Visit: Payer: Self-pay | Admitting: Internal Medicine

## 2022-07-03 ENCOUNTER — Ambulatory Visit: Payer: Commercial Managed Care - PPO | Attending: Internal Medicine | Admitting: Pharmacist

## 2022-07-03 ENCOUNTER — Other Ambulatory Visit: Payer: Self-pay

## 2022-07-03 DIAGNOSIS — E785 Hyperlipidemia, unspecified: Secondary | ICD-10-CM

## 2022-07-03 DIAGNOSIS — E1165 Type 2 diabetes mellitus with hyperglycemia: Secondary | ICD-10-CM

## 2022-07-03 DIAGNOSIS — E1169 Type 2 diabetes mellitus with other specified complication: Secondary | ICD-10-CM

## 2022-07-03 DIAGNOSIS — Z794 Long term (current) use of insulin: Secondary | ICD-10-CM

## 2022-07-03 MED ORDER — PRAVASTATIN SODIUM 20 MG PO TABS
20.0000 mg | ORAL_TABLET | Freq: Every day | ORAL | 1 refills | Status: DC
  Filled 2022-07-03: qty 90, 90d supply, fill #0

## 2022-07-03 MED ORDER — INSULIN GLARGINE-YFGN 100 UNIT/ML ~~LOC~~ SOPN
50.0000 [IU] | PEN_INJECTOR | Freq: Every day | SUBCUTANEOUS | 2 refills | Status: DC
  Filled 2022-07-03 – 2022-07-17 (×3): qty 15, 30d supply, fill #0

## 2022-07-03 MED ORDER — METFORMIN HCL ER 500 MG PO TB24
1000.0000 mg | ORAL_TABLET | Freq: Two times a day (BID) | ORAL | 5 refills | Status: DC
  Filled 2022-07-03 (×2): qty 120, 30d supply, fill #0

## 2022-07-03 MED ORDER — FERROUS SULFATE 325 (65 FE) MG PO TABS
325.0000 mg | ORAL_TABLET | Freq: Every day | ORAL | 0 refills | Status: DC
  Filled 2022-07-03: qty 100, 100d supply, fill #0

## 2022-07-03 NOTE — Telephone Encounter (Signed)
Medication Refill - Medication: ferrous sulfate 325 (65 FE) MG tablet,insulin isophane & regular human KwikPen (HUMULIN 70/30 MIX) (70-30) 100 UNIT/ML KwikPen,metFORMIN (GLUCOPHAGE-XR) 500 MG 24 hr tablet,OneTouch Delica Lancets 16X MISC,pravastatin (PRAVACHOL) 20 MG tablet  Pt stated she has gone to the pharmacy to pick up her medication and things to check her sugar but they keep telling her they don't have anything.   Has the patient contacted their pharmacy? Yes.    (Agent: If yes, when and what did the pharmacy advise?)  Preferred Pharmacy (with phone number or street name):  [place], [place] [address]  Phone: [phone] Fax: [phone]  Hours: Not open 24 hours   Has the patient been seen for an appointment in the last year OR does the patient have an upcoming appointment? Yes.    Agent: Please be advised that RX refills may take up to 3 business days. We ask that you follow-up with your pharmacy.

## 2022-07-03 NOTE — Progress Notes (Signed)
    S:     No chief complaint on file.  Brenda Greer is a 36 y.o. female who presents for diabetes evaluation, education, and management. PMH is significant for T2DM, HLD, and chronic anemia. Patient was referred and last seen by Primary Care Provider, Dr. Wynetta Emery, on 06/13/2022. At last visit, patient noted being off Ozempic for 4 months due to inability to afford.   Today, patient arrives in good spirits and presents without any assistance. Patient has not been able to check her blood sugar due to not having refills on test supplies.   Patient reports Diabetes was diagnosed since her second pregnancy, ~17 years ago. Has been on 70/30 insulin that entire time.   Family/Social History: DM- mother  Current diabetes medications include: Humulin 70/30 35 units BID, metformin 1000 mg BID  Patient reports adherence to taking all medications as prescribed.  Insurance coverage: [employer]  Patient reports symptoms of hypoglycemic events during the night. Is not checking but reports dizziness, shakiness, sweaty ~2x/week.   Reported home fasting blood sugars: not checking   Patient reports nocturia (nighttime urination). 5x/night Patient denies neuropathy (nerve pain). Patient denies visual changes. Patient reports self foot exams.   Patient-reported exercise habits: limited due to work. Works as Consulting civil engineer.   O:  Lab Results  Component Value Date   HGBA1C 12.9 (H) 05/14/2022   There were no vitals filed for this visit.  Lipid Panel     Component Value Date/Time   CHOL 222 (H) 11/18/2021 1037   TRIG 116 11/18/2021 1037   HDL 66 11/18/2021 1037   CHOLHDL 3.4 11/18/2021 1037   CHOLHDL 4.2 05/19/2013 1703   VLDL 38 05/19/2013 1703   LDLCALC 136 (H) 11/18/2021 1037   A/P: Diabetes longstanding currently uncontrolled based on A1c. Patient is able to verbalize appropriate hypoglycemia management plan. Medication adherence appears appropriate. -Discontinued Humulin 70/30  35 units BID -Started basal insulin Lantus (insulin glargine) to 50 units daily.  -Defer initiation of GLP-1 as patient unable to afford with current insurance.  -Continued metformin 1000 mg BID.  -Extensively discussed pathophysiology of diabetes, recommended lifestyle interventions, dietary effects on blood sugar control.  -Counseled on s/sx of and management of hypoglycemia.  -Next A1c anticipated 1 month.  Follow-up:  Pharmacist 1 month. PCP clinic visit on 10/19/2021.   Joseph Art, Pharm.D. PGY-2 Ambulatory Care Pharmacy Resident 07/03/2022 3:28 PM

## 2022-07-04 MED ORDER — ONETOUCH DELICA LANCETS 33G MISC: 2 refills | Status: AC

## 2022-07-04 NOTE — Telephone Encounter (Signed)
Requested medication (s) are due for refill today - no  Requested medication (s) are on the active medication list -yes  Future visit scheduled -yes  Last refill:    Notes to clinic: Call to [facility] [place] patient has Rx for lancets and pravastatin on file and they will fill these Rx for her. The new Rx for her insulin was sent to [place] yesterday after appointment and patient states she was advised it is not covered by her insurance- she still has her Rx for Humulin 70/30- she is not out yet- but would like to know if there is another Rx that would be covered by her insurnace. Request has been sent to provider for review.(Interpreter: [phone])  Requested Prescriptions  Pending Prescriptions Disp Refills   insulin isophane & regular human KwikPen (HUMULIN 70/30 MIX) (70-30) 100 UNIT/ML KwikPen 15 mL 1    Sig: Inject 35 Units into the skin in the morning and at bedtime.     Endocrinology:  Diabetes - Insulins Failed - 07/03/2022 11:17 AM      Failed - HBA1C is between 0 and 7.9 and within 180 days    HbA1c, POC (controlled diabetic range)  Date Value Ref Range Status  11/18/2021 13.2 (A) 0.0 - 7.0 % Final   Hgb A1c MFr Bld  Date Value Ref Range Status  05/14/2022 12.9 (H) 4.8 - 5.6 % Final    Comment:    (NOTE) Pre diabetes:          5.7%-6.4%  Diabetes:              >6.4%  Glycemic control for   <7.0% adults with diabetes          Passed - Valid encounter within last 6 months    Recent Outpatient Visits           Yesterday Type 2 diabetes mellitus with hyperglycemia, with long-term current use of insulin [facility])   [place], Jarome Matin, RPH-CPP   3 weeks ago Hospital discharge follow-up   [place], Deborah B, MD   7 months ago Type 2 diabetes mellitus with hyperglycemia, with long-term current use of insulin [facility])   [place] Ladell Pier, MD   1  year ago No-show for appointment   [place], Deborah B, MD   1 year ago Type 2 diabetes mellitus with peripheral neuropathy [facility])   [place], Deborah B, MD       Future Appointments             In 1 month Daisy Blossom, Jarome Matin, [place]   In 3 months Ladell Pier, MD [place]             OneTouch Delica Lancets 95M MISC 100 each 2    Sig: Use as instructed to check blood sugar three times daily. E11.42     Endocrinology: Diabetes - Testing Supplies Passed - 07/03/2022 11:17 AM      Passed - Valid encounter within last 12 months    Recent Outpatient Visits           Yesterday Type 2 diabetes mellitus with hyperglycemia, with long-term current use of insulin [facility])   [place], Jarome Matin, RPH-CPP   3 weeks ago [facility]  discharge follow-up   [facility] Jonah Blue B, MD   7 months ago Type 2 diabetes mellitus with hyperglycemia, with long-term current use of insulin [facility])   [place] [facility] Marcine Matar, MD   1 year ago No-show for appointment   [facility] Marcine Matar, MD   1 year ago Type 2 diabetes mellitus with peripheral neuropathy [facility])   [place] [facility] Marcine Matar, MD       Future Appointments             In 1 month Drucilla Chalet, RPH-CPP [place] [facility]   In 3 months Marcine Matar, MD [facility]               Requested Prescriptions  Pending Prescriptions Disp Refills   insulin isophane & regular human KwikPen (HUMULIN 70/30 MIX) (70-30) 100 UNIT/ML KwikPen 15 mL 1    Sig: Inject 35 Units into the skin in the morning and at bedtime.      Endocrinology:  Diabetes - Insulins Failed - 07/03/2022 11:17 AM      Failed - HBA1C is between 0 and 7.9 and within 180 days    HbA1c, POC (controlled diabetic range)  Date Value Ref Range Status  11/18/2021 13.2 (A) 0.0 - 7.0 % Final   Hgb A1c MFr Bld  Date Value Ref Range Status  05/14/2022 12.9 (H) 4.8 - 5.6 % Final    Comment:    (NOTE) Pre diabetes:          5.7%-6.4%  Diabetes:              >6.4%  Glycemic control for   <7.0% adults with diabetes          Passed - Valid encounter within last 6 months    Recent Outpatient Visits           Yesterday Type 2 diabetes mellitus with hyperglycemia, with long-term current use of insulin [facility])   [place] [facility] Lois Huxley, Cornelius Moras, RPH-CPP   3 weeks ago Hospital discharge follow-up   [facility] Jonah Blue B, MD   7 months ago Type 2 diabetes mellitus with hyperglycemia, with long-term current use of insulin [facility])   [place] [facility]ness Marcine Matar, MD   1 year ago No-show for appointment   [facility] Marcine Matar, MD   1 year ago Type 2 diabetes mellitus with peripheral neuropathy [facility])   [place] [facility] Marcine Matar, MD       Future Appointments             In 1 month Lois Huxley, Cornelius Moras, RPH-CPP [place] [facility]   In 3 months Marcine Matar, [facility]             OneTouch Delica Lancets 33G MISC 100 each 2    Sig: Use as instructed to check blood sugar three times daily. E11.42     Endocrinology: Diabetes - Testing Supplies Passed - 07/03/2022 11:17 AM      Passed - Valid encounter within last 12 months    Recent Outpatient Visits           Yesterday Type 2  diabetes mellitus with hyperglycemia, with long-term current use of insulin [facility])   [place] [facility] And Wellness Lois Huxley, Cornelius Moras, RPH-CPP   3 weeks ago Hospital discharge follow-up   [facility] Jonah Blue B, MD   7 months ago Type 2 diabetes mellitus with hyperglycemia, with long-term current use of insulin [facility])   [place] [facility] And Wellness Marcine Matar, MD   1 year ago No-show for appointment   [facility] Marcine Matar, MD   1 year ago Type 2 diabetes mellitus with peripheral neuropathy [facility])   [place] [facility] And Wellness Marcine Matar, MD       Future Appointments             In 1 month Lois Huxley, Cornelius Moras, RPH-CPP [place] [facility]   In 3 months Laural Benes, Binnie Rail, MD [facility] And Wellness

## 2022-07-17 ENCOUNTER — Other Ambulatory Visit: Payer: Self-pay | Admitting: Pharmacist

## 2022-07-17 ENCOUNTER — Other Ambulatory Visit: Payer: Self-pay

## 2022-07-17 ENCOUNTER — Ambulatory Visit: Payer: Self-pay | Admitting: *Deleted

## 2022-07-17 DIAGNOSIS — E1169 Type 2 diabetes mellitus with other specified complication: Secondary | ICD-10-CM

## 2022-07-17 DIAGNOSIS — E1165 Type 2 diabetes mellitus with hyperglycemia: Secondary | ICD-10-CM

## 2022-07-17 MED ORDER — PRAVASTATIN SODIUM 20 MG PO TABS: 20.0000 mg | ORAL_TABLET | Freq: Every day | ORAL | 1 refills | Status: DC

## 2022-07-17 MED ORDER — METFORMIN HCL ER 500 MG PO TB24: 1000.0000 mg | ORAL_TABLET | Freq: Two times a day (BID) | ORAL | 5 refills | Status: DC

## 2022-07-17 MED ORDER — INSULIN GLARGINE-YFGN 100 UNIT/ML ~~LOC~~ SOPN: 50.0000 [IU] | PEN_INJECTOR | Freq: Every day | SUBCUTANEOUS | 2 refills | Status: DC

## 2022-07-17 MED ORDER — ONETOUCH VERIO VI STRP: ORAL_STRIP | 3 refills | Status: DC

## 2022-07-17 NOTE — Telephone Encounter (Signed)
Reason for Disposition  [1] Caller has URGENT medicine question about med that PCP or specialist prescribed AND [2] triager unable to answer question    Been without insulin for a week.   Insurance would not cover new insulin that was ordered.   Needing further direction what to do.  Answer Assessment - Initial Assessment Questions 1. NAME of MEDICINE: "What medicine(s) are you calling about?"     Insulin.   Been without it for a week.  (Calling with Spanish interpreter Shelby Mattocks [phone]) 2. QUESTION: "What is your question?" (e.g., double dose of medicine, side effect)     They prescribed a different insulin and my insurance does not cover it.   No one called me.  I don't know what to do?   Seen by pharmacist.    3. PRESCRIBER: "Who prescribed the medicine?" Reason: if prescribed by specialist, call should be referred to that group.     The nutritionist ordered it. 4. SYMPTOMS: "Do you have any symptoms?" If Yes, ask: "What symptoms are you having?"  "How bad are the symptoms (e.g., mild, moderate, severe)     I haven't been able to check my sugar because they did not send me my test strips.   Denies symptoms.   5. PREGNANCY:  "Is there any chance that you are pregnant?" "When was your last menstrual period?"     N/A  Protocols used: Medication Question Call-A-AH  Chief Complaint: Been without insulin for a week.   New insulin ordered but insurance doesn't cover it.  Symptoms: Needing to know what to do.   "No one has called me" Frequency: For the past week been without insulin. Pertinent Negatives: Patient denies having dizziness, shaking, sweating, etc.   Denies having any symptoms.   Unable to check glucose because out of test strips. Disposition: [] ED /[] Urgent Care (no appt availability in office) / [] Appointment(In office/virtual)/ []  [facility]/ [] Home Care/ [] Refused Recommended Disposition /[]  Mobile Bus/ [x]  Follow-up with PCP Additional Notes: High priority  message sent to [facility] for Dr. Karle Plumber and the pharmacist she saw [facility].  Pt agreeable to being called back.   Number in chart is correct.

## 2022-07-17 NOTE — Telephone Encounter (Signed)
Phone call placed to patient today regarding her insulin.  She had seen the clinical pharmacist student on 18 September.  She was changed to glargine insulin 50 units daily.  Patient states that her insurance does not cover this insulin.  Looks like the clinical pharmacist had changed it to [facility] today and sent a prescription to [facility] for Semglee 50 units daily.  Patient was not aware that the prescription was changed.  She states she will go by [facility] today to pick it up. Interpreter used with Levada Dy with [place] interpreters ID number [id number].

## 2022-08-07 ENCOUNTER — Ambulatory Visit: Payer: Commercial Managed Care - PPO | Admitting: Pharmacist

## 2022-08-21 ENCOUNTER — Other Ambulatory Visit: Payer: Self-pay

## 2022-09-11 ENCOUNTER — Telehealth: Payer: Self-pay | Admitting: Emergency Medicine

## 2022-09-11 DIAGNOSIS — Z794 Long term (current) use of insulin: Secondary | ICD-10-CM

## 2022-09-11 NOTE — Telephone Encounter (Signed)
Copied from CRM [id number]. Topic: Appointment Scheduling - Scheduling Inquiry for Clinic >> Sep 11, 2022 11:50 AM Everette C wrote: Reason for CRM: The patient has called to request an appointment with a nutritionist   Please contact the patient when possible to further discuss scheduling

## 2022-09-13 NOTE — Addendum Note (Signed)
Addended by: Jonah Blue B on: 09/13/2022 04:20 PM   Modules accepted: Orders

## 2022-09-14 ENCOUNTER — Other Ambulatory Visit: Payer: Self-pay

## 2022-10-19 ENCOUNTER — Ambulatory Visit: Payer: Commercial Managed Care - PPO | Attending: Internal Medicine | Admitting: Internal Medicine

## 2022-10-19 VITALS — BP 126/82 | HR 105 | Temp 98.2°F | Ht 64.0 in | Wt 157.0 lb

## 2022-10-19 DIAGNOSIS — Z794 Long term (current) use of insulin: Secondary | ICD-10-CM | POA: Diagnosis not present

## 2022-10-19 DIAGNOSIS — B369 Superficial mycosis, unspecified: Secondary | ICD-10-CM

## 2022-10-19 DIAGNOSIS — E1165 Type 2 diabetes mellitus with hyperglycemia: Secondary | ICD-10-CM | POA: Diagnosis not present

## 2022-10-19 DIAGNOSIS — D649 Anemia, unspecified: Secondary | ICD-10-CM | POA: Diagnosis not present

## 2022-10-19 LAB — POCT GLYCOSYLATED HEMOGLOBIN (HGB A1C): HbA1c, POC (controlled diabetic range): 11.5 % — AB (ref 0.0–7.0)

## 2022-10-19 LAB — GLUCOSE, POCT (MANUAL RESULT ENTRY): POC Glucose: 291 mg/dl — AB (ref 70–99)

## 2022-10-19 MED ORDER — NYSTATIN-TRIAMCINOLONE 100000-0.1 UNIT/GM-% EX CREA: 1.0000 | TOPICAL_CREAM | Freq: Two times a day (BID) | CUTANEOUS | 0 refills | Status: DC

## 2022-10-19 MED ORDER — DEXCOM G7 SENSOR MISC: 6 refills | Status: DC

## 2022-10-19 MED ORDER — INSULIN LISPRO (1 UNIT DIAL) 100 UNIT/ML (KWIKPEN): PEN_INJECTOR | SUBCUTANEOUS | 11 refills | Status: DC

## 2022-10-19 MED ORDER — FLUCONAZOLE 150 MG PO TABS: 150.0000 mg | ORAL_TABLET | Freq: Every day | ORAL | 0 refills | Status: DC

## 2022-10-19 MED ORDER — DEXCOM G7 RECEIVER DEVI: 0 refills | Status: DC

## 2022-10-19 MED ORDER — INSULIN GLARGINE-YFGN 100 UNIT/ML ~~LOC~~ SOPN: 55.0000 [IU] | PEN_INJECTOR | Freq: Every day | SUBCUTANEOUS | 2 refills | Status: DC

## 2022-10-19 NOTE — Progress Notes (Signed)
Patient ID: Brenda Greer, female    DOB: 1986/03/13  MRN: [id number]  CC: Diabetes (DM f/u. Med refill./Itchiness on buttocks, rash on buttocks X2 weeks. )   Subjective: Brenda Greer is a 37 y.o. female who presents for chronic ds management Her concerns today include:  Pt has DM, HL, GERD,  Iron def anemia, migraines without aura.   DM: Results for orders placed or performed in visit on 10/19/22  POCT glucose (manual entry)  Result Value Ref Range   POC Glucose 291 (A) 70 - 99 mg/dl  POCT glycosylated hemoglobin (Hb A1C)  Result Value Ref Range   Hemoglobin A1C     HbA1c POC (<> result, manual entry)     HbA1c, POC (prediabetic range)     HbA1c, POC (controlled diabetic range) 11.5 (A) 0.0 - 7.0 %  A1C has improved slightly from 12.9 in July Out of insulin the last 2 wks of Nov 2023; reports she had problems getting it from the pharmacy.  She has been taking the Semglee 50 units since then.  Also taking Metformin 1 gram BID of the XR tabs. Checks BS only before breakfast.  Does not have log or glucometer with her but reports range 190-200s. Doing okay with eating habits. Not getting in exercise.  "I'm lazy."  C/o itchy around anus and outside of vagina x 2 wks.  No hx of hemorrhoids that she is aware off.  No blood in stools.  No worms in stools  Hx of anemia with last H/H 10.8/32.7 No longer on iron supplement.  Reports pharmacy did not fill rxn in 06/2022.  It may be her insurance does not pay for OTC med  Patient Active Problem List   Diagnosis Date Noted   Type 2 diabetes mellitus with hyperglycemia ([place]) 05/14/2022   Abdominal wall cellulitis 05/13/2022   History of herpes genitalis 11/18/2021   Microalbuminuria due to type 2 diabetes mellitus ([place]) 01/22/2021   COVID-19 vaccine series completed 09/21/2020   Uncontrolled type 2 diabetes mellitus with peripheral neuropathy 07/18/2019   Migraine without aura and without status migrainosus, not  intractable 07/18/2019   Hyperlipidemia associated with type 2 diabetes mellitus ([place]) 07/18/2019   Iron deficiency anemia due to chronic blood loss 07/02/2018   Hyperlipidemia 02/26/2018   Gastroesophageal reflux disease without esophagitis 02/26/2018   Diabetes mellitus without complication ([place]) 17/91/5056   Type II or unspecified type diabetes mellitus without mention of complication, uncontrolled 05/19/2013   OBESITY, NOS 12/13/2006     Current Outpatient Medications on File Prior to Visit  Medication Sig Dispense Refill   Blood Glucose Monitoring Suppl (ONETOUCH VERIO) w/Device KIT Use as instructed to check blood sugar three times daily. E11.42 1 kit 0   ferrous sulfate 325 (65 FE) MG tablet Take 1 tablet (325 mg total) by mouth daily with breakfast. 100 tablet 0   glucose blood (ONETOUCH VERIO) test strip Use as instructed to check blood sugar three times daily. E11.42 100 each 3   insulin glargine-yfgn (SEMGLEE, YFGN,) 100 UNIT/ML Pen Inject 50 Units into the skin daily. 15 mL 2   Insulin Pen Needle (BD PEN NEEDLE NANO U/F) 32G X 4 MM MISC Use to inject insulin in the morning and bedtime 100 each 0   metFORMIN (GLUCOPHAGE-XR) 500 MG 24 hr tablet Take 2 tablets (1,000 mg total) by mouth 2 (two) times daily with a meal. 120 tablet 5   OneTouch Delica Lancets 97X MISC Use as instructed to check blood sugar  three times daily. E11.42 100 each 2   pravastatin (PRAVACHOL) 20 MG tablet Take 1 tablet (20 mg total) by mouth at bedtime. 90 tablet 1   No current facility-administered medications on file prior to visit.    No Known Allergies  Social History   Socioeconomic History   Marital status: Single    Spouse name: Not on file   Number of children: Not on file   Years of education: Not on file   Highest education level: Not on file  Occupational History   Not on file  Tobacco Use   Smoking status: Never   Smokeless tobacco: Never  Substance and Sexual Activity   Alcohol use:  No   Drug use: No   Sexual activity: Not on file  Other Topics Concern   Not on file  Social History Narrative   Not on file   Social Determinants of Health   Financial Resource Strain: Not on file  Food Insecurity: Not on file  Transportation Needs: Not on file  Physical Activity: Not on file  Stress: Not on file  Social Connections: Not on file  Intimate Partner Violence: Not on file    Family History  Problem Relation Age of Onset   Diabetes Mother    Hypertension Mother     No past surgical history on file.  ROS: Review of Systems Negative except as stated above  PHYSICAL EXAM: BP 126/82 (BP Location: Left Arm, Patient Position: Sitting, Cuff Size: Normal)   Pulse (!) 105   Temp 98.2 F (36.8 C) (Oral)   Ht _0  (1.626 m)   Wt 157 lb (71.2 kg)   SpO2 100%   BMI 26.95 kg/m   Physical Exam  General appearance - alert, well appearing, and in no distress Mental status - normal mood, behavior, speech, dress, motor activity, and thought processes Neck - supple, no significant adenopathy Chest - clear to auscultation, no wheezes, rales or rhonchi, symmetric air entry Heart - normal rate, regular rhythm, normal S1, S2, no murmurs, rubs, clicks or gallops Extremities - peripheral pulses normal, no pedal edema, no clubbing or cyanosis Skin -CMA Clarissa present as chaperone: Patient has hypopigmented waxy appearance to the skin between the buttock, in the perineal area and external vagina      Latest Ref Rng & Units 05/15/2022    4:54 AM 05/14/2022    5:27 AM 05/13/2022   10:24 PM  CMP  Glucose 70 - 99 mg/dL 177  258  377   BUN 6 - 20 mg/dL <5  <5  6   Creatinine 0.44 - 1.00 mg/dL 0.33  <0.30  0.45   Sodium 135 - 145 mmol/L 137  137  138   Potassium 3.5 - 5.1 mmol/L 3.6  3.4  3.3   Chloride 98 - 111 mmol/L 108  108  107   CO2 22 - 32 mmol/L _1 Calcium 8.9 - 10.3 mg/dL 8.6  7.6  9.0   Total Protein 6.5 - 8.1 g/dL 5.7  5.9    Total Bilirubin 0.3 -  1.2 mg/dL 0.1  0.7    Alkaline Phos 38 - 126 U/L 52  55    AST 15 - 41 U/L 25  66    ALT 0 - 44 U/L 21  27     Lipid Panel     Component Value Date/Time   CHOL 222 (H) 11/18/2021 1037   TRIG 116 11/18/2021 1037   HDL  66 11/18/2021 1037   CHOLHDL 3.4 11/18/2021 1037   CHOLHDL 4.2 05/19/2013 1703   VLDL 38 05/19/2013 1703   LDLCALC 136 (H) 11/18/2021 1037    CBC    Component Value Date/Time   WBC 5.8 05/15/2022 0454   RBC 3.72 (L) 05/15/2022 0454   RBC 3.71 (L) 05/15/2022 0454   HGB 10.8 (L) 05/15/2022 0454   HGB 12.8 11/18/2021 1037   HCT 32.7 (L) 05/15/2022 0454   HCT 39.8 11/18/2021 1037   PLT 200 05/15/2022 0454   PLT 226 11/18/2021 1037   MCV 87.9 05/15/2022 0454   MCV 87 11/18/2021 1037   MCH 29.0 05/15/2022 0454   MCHC 33.0 05/15/2022 0454   RDW 14.1 05/15/2022 0454   RDW 13.9 11/18/2021 1037   LYMPHSABS 1.7 05/15/2022 0454   MONOABS 0.5 05/15/2022 0454   EOSABS 0.2 05/15/2022 0454   BASOSABS 0.0 05/15/2022 0454   Lab Results  Component Value Date   IRON 17 (L) 05/15/2022   TIBC 205 (L) 05/15/2022   FERRITIN 80 05/15/2022    ASSESSMENT AND PLAN:  1. Type 2 diabetes mellitus with hyperglycemia, with long-term current use of insulin (HCC) Not at goal. Strongly encouraged her to start moving more as this will help decrease her blood sugars.  Discussed and encourage healthy eating habits. Recommend increase Semglee insulin to 55 units daily.  Add Humalog 4 units with the 2 largest meals of the day which she states are breakfast and lunch.  We have tried getting her on Trulicity in the past but co-pay was too expensive. Discussed continuous glucose monitor.  She is interested in getting one if it is covered by her insurance. Will have her follow-up with our clinical pharmacist in several weeks. - POCT glucose (manual entry) - POCT glycosylated hemoglobin (Hb A1C) - Microalbumin / creatinine urine ratio - Ambulatory referral to Ophthalmology - insulin lispro  (HUMALOG KWIKPEN) 100 UNIT/ML KwikPen; 4 units subcut Q BF and lunch  Dispense: 15 mL; Refill: 11 - insulin glargine-yfgn (SEMGLEE, YFGN,) 100 UNIT/ML Pen; Inject 55 Units into the skin daily.  Dispense: 15 mL; Refill: 2 - Continuous Blood Gluc Receiver ([place]) DEVI; UAD  Dispense: 1 each; Refill: 0 - Continuous Blood Gluc Sensor (DEXCOM G7 SENSOR) MISC; Change device Q 10 days/  Dispense: 3 each; Refill: 6  2. Chronic anemia I wrote down the name of ferrous sulfate and the dose of 64 mg for her to purchase over-the-counter and take 1 daily. - CBC - Iron, TIBC and Ferritin Panel  3. Fungal dermatitis Discussed the importance of good diabetes control to help with this.  Given prescription for Mycolog cream.  Advised not to use inside the vagina. - nystatin-triamcinolone (MYCOLOG II) cream; Apply 1 Application topically 2 (two) times daily.  Dispense: 30 g; Refill: 0 - fluconazole (DIFLUCAN) 150 MG tablet; Take 1 tablet (150 mg total) by mouth daily.  Dispense: 2 tablet; Refill: 0   [employer] Language interpreter used during this encounter. #[id number], Lucas Mallow  Patient was given the opportunity to ask questions.  Patient verbalized understanding of the plan and was able to repeat key elements of the plan.   This documentation was completed using Radio producer.  Any transcriptional errors are unintentional.  Orders Placed This Encounter  Procedures   POCT glucose (manual entry)   POCT glycosylated hemoglobin (Hb A1C)     Requested Prescriptions    No prescriptions requested or ordered in this encounter    No  follow-ups on file.  Karle Plumber, MD, FACP

## 2022-10-20 LAB — MICROALBUMIN / CREATININE URINE RATIO
Creatinine, Urine: 30.1 mg/dL
Microalb/Creat Ratio: 54 mg/g creat — ABNORMAL HIGH (ref 0–29)
Microalbumin, Urine: 16.2 ug/mL

## 2022-10-20 LAB — CBC
Hematocrit: 39.8 % (ref 34.0–46.6)
Hemoglobin: 12.7 g/dL (ref 11.1–15.9)
MCH: 27.1 pg (ref 26.6–33.0)
MCHC: 31.9 g/dL (ref 31.5–35.7)
MCV: 85 fL (ref 79–97)
Platelets: 163 10*3/uL (ref 150–450)
RBC: 4.69 x10E6/uL (ref 3.77–5.28)
RDW: 13.8 % (ref 11.7–15.4)
WBC: 8.2 10*3/uL (ref 3.4–10.8)

## 2022-10-20 LAB — IRON,TIBC AND FERRITIN PANEL
Ferritin: 54 ng/mL (ref 15–150)
Iron Saturation: 7 % — CL (ref 15–55)
Iron: 23 ug/dL — ABNORMAL LOW (ref 27–159)
Total Iron Binding Capacity: 310 ug/dL (ref 250–450)
UIBC: 287 ug/dL (ref 131–425)

## 2022-10-23 ENCOUNTER — Other Ambulatory Visit: Payer: Self-pay | Admitting: Pharmacist

## 2022-10-23 MED ORDER — INSULIN DEGLUDEC 100 UNIT/ML ~~LOC~~ SOPN: 55.0000 [IU] | PEN_INJECTOR | Freq: Every day | SUBCUTANEOUS | 2 refills | Status: DC

## 2022-10-25 ENCOUNTER — Other Ambulatory Visit: Payer: Self-pay

## 2022-10-25 ENCOUNTER — Other Ambulatory Visit: Payer: Self-pay | Admitting: Pharmacist

## 2022-10-25 ENCOUNTER — Telehealth: Payer: Self-pay | Admitting: Internal Medicine

## 2022-10-25 MED ORDER — NOVOLOG FLEXPEN 100 UNIT/ML ~~LOC~~ SOPN: 4.0000 [IU] | PEN_INJECTOR | Freq: Two times a day (BID) | SUBCUTANEOUS | 1 refills | Status: DC

## 2022-10-25 NOTE — Telephone Encounter (Signed)
Called & spoke to the patient. Verified name & DOB. Informed of medication changes. Patient expressed verbal understanding.

## 2022-10-25 NOTE — Telephone Encounter (Signed)
-----   Message from Rickey Barbara, CPhT sent at 10/25/2022 10:23 AM EST ----- Brenda Greer went through ins per [facility] she need to be on [place]? I told them just the [place] but can call them back if different-Semglee is not covered. The Humalog is rejecting as out-of-network for [facility] and for [place]. Maybe resend for Novolog? The patient may have to contact the 800# on the back of her card to see where she needs to fill this med in-network or which medication is preferred. Because we are out-of-network we cannot look up her preferred formulary for this specific medication. [facility] and myself are unaware of why Brenda Greer would go through but Humalog would not. I don't know what her copay would be with ins, but [facility] offers a $35 copay for Humalog without ins if needed. ----- Message ----- From: Ladell Pier, MD Sent: 10/25/2022  10:03 AM EST To: Tresa Endo, RPH-CPP; #  Patient is on Semglee and Humalog pens.  My CMA recently called her to go over lab results.  She told my CMA that the insulin pen is no longer covered by her insurance.  Can you please look at her insurance coverage and let me know whether the insurance does not cover the pens or whether current ones are not on the preferred list and if so, which ones I need to change to.

## 2022-10-27 ENCOUNTER — Other Ambulatory Visit: Payer: Self-pay

## 2022-10-27 ENCOUNTER — Other Ambulatory Visit: Payer: Self-pay | Admitting: Pharmacist

## 2022-10-27 MED ORDER — FIASP FLEXTOUCH 100 UNIT/ML ~~LOC~~ SOPN: 4.0000 [IU] | PEN_INJECTOR | Freq: Two times a day (BID) | SUBCUTANEOUS | 1 refills | Status: DC

## 2022-11-20 ENCOUNTER — Other Ambulatory Visit: Payer: Self-pay | Admitting: Pharmacist

## 2022-11-20 ENCOUNTER — Ambulatory Visit: Payer: Commercial Managed Care - PPO | Attending: Family Medicine | Admitting: Pharmacist

## 2022-11-20 DIAGNOSIS — Z794 Long term (current) use of insulin: Secondary | ICD-10-CM | POA: Diagnosis not present

## 2022-11-20 DIAGNOSIS — E1165 Type 2 diabetes mellitus with hyperglycemia: Secondary | ICD-10-CM

## 2022-11-20 MED ORDER — FIASP FLEXTOUCH 100 UNIT/ML ~~LOC~~ SOPN: 6.0000 [IU] | PEN_INJECTOR | Freq: Three times a day (TID) | SUBCUTANEOUS | 1 refills | Status: DC

## 2022-11-20 NOTE — Progress Notes (Signed)
    S:    PCP: Dr. Wynetta Emery  37 y.o. female who presents for diabetes evaluation, education, and management.  PMH is significant for T2DM, HLD, and chronic anemia .  Patient was referred by Primary Care Provider, Dr. Wynetta Emery, on 06/13/2022. Last seen by pharmacy clinic on 07/03/2022. Last seen by PCP on 10/19/2022.   At last visit with PCP, A1c was down 11.5% from 12.9% and patient had reported being out of insulin for ~2 weeks in November. Dr. Wynetta Emery increased her Lantus to 55 units and added Fiasp 4 units BID.    Today, patient arrives in good spirits and presents with the assistance of an online interpretor. States her insurance did cover some of the cost of Dexcom CGM, but the copay was still too high for her.   Patient reports diabetes was diagnosed since her second pregnancy, ~17 years ago. Has been on insulin that entire time.    Family/Social History:  DM- mother  Current diabetes medications include: Tresiba 55 units daily, Fiasp 4 units BID, metformin XR 1000 mg BID  Patient reports adherence to taking all medications as prescribed.   Insurance coverage: UHC   Patient denies hypoglycemic events.  Reported home fasting blood sugars: 88-175; a few readings 208-270s.   Reported 2 hour post-meal/random blood sugars: not checking.  Patient denies nocturia (nighttime urination).  Patient reports neuropathy (nerve pain)- toes Patient denies visual changes. Patient reports self foot exams.   Patient reported dietary habits: Eats 3 meals/day Lunch: chicken salad  Dinner: 4 tortillas Drinks: diet Coke, water   O:  7 day average blood glucose: no meter at visit today.   Lab Results  Component Value Date   HGBA1C 11.5 (A) 10/19/2022   There were no vitals filed for this visit.  Lipid Panel     Component Value Date/Time   CHOL 222 (H) 11/18/2021 1037   TRIG 116 11/18/2021 1037   HDL 66 11/18/2021 1037   CHOLHDL 3.4 11/18/2021 1037   CHOLHDL 4.2 05/19/2013 1703   VLDL  38 05/19/2013 1703   LDLCALC 136 (H) 11/18/2021 1037    Clinical Atherosclerotic Cardiovascular Disease (ASCVD): No  The ASCVD Risk score (Arnett DK, et al., 2019) failed to calculate for the following reasons:   The 2019 ASCVD risk score is only valid for ages 32 to 56    A/P: Diabetes longstanding currently above goal based on A1c. Patient is able to verbalize appropriate hypoglycemia management plan. Medication adherence appears appropriate. Patient is unable to afford CGM copay.  -Continued basal insulin Tresiba 55 units daily.  -Increased dose of rapid insulin Fiasp to 6 units TID. Will work towards getting patient a more 50-50 basal-bolus balance.  -Unable to afford Trulicity or Ozempic copay with current insurance.  -Defer SGLT2-I initiation until A1c closer to goal.  -Continued metformin 1000 mg BID.  -Patient educated on purpose, proper use, and potential adverse effects of insulin.  -Extensively discussed pathophysiology of diabetes, recommended lifestyle interventions, dietary effects on blood sugar control.  -Counseled on s/sx of and management of hypoglycemia.  -Next A1c anticipated March 2024.   Written patient instructions provided. Patient verbalized understanding of treatment plan.  Total time in face to face counseling 25 minutes.    Follow-up:  Pharmacist 1 month. PCP clinic visit in April 2024.   Joseph Art, Pharm.D. PGY-2 Ambulatory Care Pharmacy Resident 11/20/2022 4:00 PM

## 2022-12-19 ENCOUNTER — Ambulatory Visit: Payer: Commercial Managed Care - PPO | Attending: Internal Medicine | Admitting: Pharmacist

## 2022-12-19 DIAGNOSIS — Z794 Long term (current) use of insulin: Secondary | ICD-10-CM

## 2022-12-19 DIAGNOSIS — E1165 Type 2 diabetes mellitus with hyperglycemia: Secondary | ICD-10-CM

## 2022-12-19 MED ORDER — FIASP FLEXTOUCH 100 UNIT/ML ~~LOC~~ SOPN: 6.0000 [IU] | PEN_INJECTOR | Freq: Three times a day (TID) | SUBCUTANEOUS | 1 refills | Status: DC

## 2022-12-19 MED ORDER — INSULIN DEGLUDEC 100 UNIT/ML ~~LOC~~ SOPN: 55.0000 [IU] | PEN_INJECTOR | Freq: Every day | SUBCUTANEOUS | 2 refills | Status: DC

## 2022-12-19 NOTE — Progress Notes (Signed)
    S:    PCP: Dr. Wynetta Emery  37 y.o. female who presents for diabetes evaluation, education, and management. PMH is significant for T2DM, HLD, and chronic anemia .  Patient was referred by Primary Care Provider, Dr. Wynetta Emery, on 10/19/2022. Pharmacy saw her on 11/20/2022. We increased her mealtime insulin at that visit.  Today, patient arrives in good spirits and presents with the assistance of an online interpretor. She is doing well today with no complaints. Patient reports diabetes was diagnosed during her second pregnancy, ~17 years ago. Has been on insulin since.  Family/Social History:  DM- mother Tobacco: never smoker  Alcohol: none reported  Current diabetes medications include: Tresiba 55 units daily, Fiasp 6 units TID (taking BID), metformin XR 1000 mg BID Patient reports adherence to taking all medications as prescribed. She has no issues getting these insulins.   Insurance coverage: UHC   Patient denies hypoglycemic events.  Reported home fasting blood sugars: 69 - 139. 2 outliers of 152, 193. Reported 2 hour post-meal/random blood sugars: 101 - 233. Most values are in the 150s-190s.   Patient denies nocturia (nighttime urination).  Patient denies neuropathy (nerve pain)- toes Patient denies visual changes. Patient reports self foot exams.   Patient reported dietary habits: Eats 3 meals/day Lunch: chicken salad  Dinner: 4 tortillas Drinks: diet Coke, water   O:  7 day average blood glucose: no meter at visit today.  Does bring her CBG log from home.   Lab Results  Component Value Date   HGBA1C 11.5 (A) 10/19/2022   There were no vitals filed for this visit.  Lipid Panel     Component Value Date/Time   CHOL 222 (H) 11/18/2021 1037   TRIG 116 11/18/2021 1037   HDL 66 11/18/2021 1037   CHOLHDL 3.4 11/18/2021 1037   CHOLHDL 4.2 05/19/2013 1703   VLDL 38 05/19/2013 1703   LDLCALC 136 (H) 11/18/2021 1037    Clinical Atherosclerotic Cardiovascular Disease  (ASCVD): No  The ASCVD Risk score (Arnett DK, et al., 2019) failed to calculate for the following reasons:   The 2019 ASCVD risk score is only valid for ages 100 to 67    A/P: Diabetes longstanding currently above goal based on A1c, however, her home sugars looks much better with the additiona and titration of Fiasp insulin. Patient is able to verbalize appropriate hypoglycemia management plan. Medication adherence appears appropriate. Patient is unable to afford CGM copay.  -Continued basal insulin Tresiba 55 units daily.  -Continued Fiasp to 6 units TID.  -Unable to afford Trulicity or Ozempic copay with current insurance.  -Defer SGLT2-I initiation until A1c closer to goal.  -Continued metformin 1000 mg BID (takes 500 mg XR tablets).  -Patient educated on purpose, proper use, and potential adverse effects of insulin.  -Extensively discussed pathophysiology of diabetes, recommended lifestyle interventions, dietary effects on blood sugar control.  -Counseled on s/sx of and management of hypoglycemia.  -Next A1c anticipated April 2024.   Written patient instructions provided. Patient verbalized understanding of treatment plan.  Total time in face to face counseling 25 minutes.    Follow-up:  Pharmacist 1 month. PCP clinic visit in April 2024.   Benard Halsted, PharmD, [place], [place] [phone]

## 2023-01-18 ENCOUNTER — Ambulatory Visit: Payer: Commercial Managed Care - PPO | Attending: Internal Medicine | Admitting: Internal Medicine

## 2023-01-18 ENCOUNTER — Encounter: Payer: Self-pay | Admitting: Internal Medicine

## 2023-01-18 VITALS — BP 119/76 | HR 90 | Temp 97.6°F | Ht 64.0 in | Wt 163.0 lb

## 2023-01-18 DIAGNOSIS — Z794 Long term (current) use of insulin: Secondary | ICD-10-CM

## 2023-01-18 DIAGNOSIS — D500 Iron deficiency anemia secondary to blood loss (chronic): Secondary | ICD-10-CM

## 2023-01-18 DIAGNOSIS — E119 Type 2 diabetes mellitus without complications: Secondary | ICD-10-CM | POA: Diagnosis not present

## 2023-01-18 LAB — POCT GLYCOSYLATED HEMOGLOBIN (HGB A1C): HbA1c, POC (controlled diabetic range): 9.2 % — AB (ref 0.0–7.0)

## 2023-01-18 LAB — GLUCOSE, POCT (MANUAL RESULT ENTRY): POC Glucose: 184 mg/dl — AB (ref 70–99)

## 2023-01-18 MED ORDER — INSULIN DEGLUDEC 100 UNIT/ML ~~LOC~~ SOPN: 55.0000 [IU] | PEN_INJECTOR | Freq: Every day | SUBCUTANEOUS | 2 refills | Status: DC

## 2023-01-18 NOTE — Progress Notes (Signed)
Patient ID: Brenda Greer, female    DOB: Nov 22, 1985  MRN: [id number]  CC: Diabetes (DM f/u. Med refill. /Vomiting X1 day. /)   Subjective: Brenda Greer is a 37 y.o. female who presents for chronic ds managment Her concerns today include:  Pt has DM, HL, GERD,  Iron def anemia, migraines without aura.   [employer] Language interpreter used during this encounter. #Jessie [id number]  DM: Results for orders placed or performed in visit on 01/18/23  POCT glucose (manual entry)  Result Value Ref Range   POC Glucose 184 (A) 70 - 99 mg/dl  POCT glycosylated hemoglobin (Hb A1C)  Result Value Ref Range   Hemoglobin A1C     HbA1c POC (<> result, manual entry)     HbA1c, POC (prediabetic range)     HbA1c, POC (controlled diabetic range) 9.2 (A) 0.0 - 7.0 %  A1C has improved from 11.5 on last visit On last visit, I recommend increase Semglee insulin to 55 units daily. Add Humalog 4 units with the 2 largest meals of the day which she states are breakfast and lunch.  Semglee no longer made.  Changed to [place].  Humalog changed to Aspart insulin 6 units with meals.   taking Metformin XR 1 gram BID.  Seen by clinical pharmacist a few times -insurance did not cover the CGM; checking BID before meals.  She has log and glucometer with her.  Range 86-166 -doing good with eating habits Eye exam ordered on last visit, has an upcoming appt.  IDA: taking iron supplement 3-4x/wk Patient Active Problem List   Diagnosis Date Noted   Type 2 diabetes mellitus with hyperglycemia 05/14/2022   Abdominal wall cellulitis 05/13/2022   History of herpes genitalis 11/18/2021   Microalbuminuria due to type 2 diabetes mellitus 01/22/2021   COVID-19 vaccine series completed 09/21/2020   Uncontrolled type 2 diabetes mellitus with peripheral neuropathy 07/18/2019   Migraine without aura and without status migrainosus, not intractable 07/18/2019   Hyperlipidemia associated with type 2 diabetes mellitus  07/18/2019   Iron deficiency anemia due to chronic blood loss 07/02/2018   Hyperlipidemia 02/26/2018   Gastroesophageal reflux disease without esophagitis 02/26/2018   Diabetes mellitus without complication 0000000   Type II or unspecified type diabetes mellitus without mention of complication, uncontrolled 05/19/2013   OBESITY, NOS 12/13/2006     Current Outpatient Medications on File Prior to Visit  Medication Sig Dispense Refill   Blood Glucose Monitoring Suppl (ONETOUCH VERIO) w/Device KIT Use as instructed to check blood sugar three times daily. E11.42 1 kit 0   Continuous Blood Gluc Receiver (DEXCOM G7 RECEIVER) DEVI UAD 1 each 0   Continuous Blood Gluc Sensor (DEXCOM G7 SENSOR) MISC Change device Q 10 days/ 3 each 6   ferrous sulfate 325 (65 FE) MG tablet Take 1 tablet (325 mg total) by mouth daily with breakfast. 100 tablet 0   fluconazole (DIFLUCAN) 150 MG tablet Take 1 tablet (150 mg total) by mouth daily. 2 tablet 0   glucose blood (ONETOUCH VERIO) test strip Use as instructed to check blood sugar three times daily. E11.42 100 each 3   insulin aspart (FIASP FLEXTOUCH) 100 UNIT/ML FlexTouch Pen Inject 6 Units into the skin in the morning, at noon, and at bedtime. 15 mL 1   Insulin Pen Needle (BD PEN NEEDLE NANO U/F) 32G X 4 MM MISC Use to inject insulin in the morning and bedtime 100 each 0   metFORMIN (GLUCOPHAGE-XR) 500 MG 24 hr tablet Take  2 tablets (1,000 mg total) by mouth 2 (two) times daily with a meal. 120 tablet 5   nystatin-triamcinolone (MYCOLOG II) cream Apply 1 Application topically 2 (two) times daily. 30 g 0   OneTouch Delica Lancets 99991111 MISC Use as instructed to check blood sugar three times daily. E11.42 100 each 2   pravastatin (PRAVACHOL) 20 MG tablet Take 1 tablet (20 mg total) by mouth at bedtime. 90 tablet 1   No current facility-administered medications on file prior to visit.    No Known Allergies  Social History   Socioeconomic History   Marital  status: Single    Spouse name: Not on file   Number of children: Not on file   Years of education: Not on file   Highest education level: Not on file  Occupational History   Not on file  Tobacco Use   Smoking status: Never   Smokeless tobacco: Never  Substance and Sexual Activity   Alcohol use: No   Drug use: No   Sexual activity: Not on file  Other Topics Concern   Not on file  Social History Narrative   Not on file   Social Determinants of Health   Financial Resource Strain: Not on file  Food Insecurity: Not on file  Transportation Needs: Not on file  Physical Activity: Not on file  Stress: Not on file  Social Connections: Not on file  Intimate Partner Violence: Not on file    Family History  Problem Relation Age of Onset   Diabetes Mother    Hypertension Mother     No past surgical history on file.  ROS: Review of Systems Negative except as stated above  PHYSICAL EXAM: BP 119/76 (BP Location: Left Arm, Patient Position: Sitting, Cuff Size: Normal)   Pulse 90   Temp 97.6 F (36.4 C) (Oral)   Ht 5\' 4"  (1.626 m)   Wt 163 lb (73.9 kg)   SpO2 98%   BMI 27.98 kg/m   Physical Exam  General appearance - alert, well appearing, middle-age Hispanic female and in no distress Mental status - normal mood, behavior, speech, dress, motor activity, and thought processes      Latest Ref Rng & Units 05/15/2022    4:54 AM 05/14/2022    5:27 AM 05/13/2022   10:24 PM  CMP  Glucose 70 - 99 mg/dL 177  258  377   BUN 6 - 20 mg/dL <5  <5  6   Creatinine 0.44 - 1.00 mg/dL 0.33  <0.30  0.45   Sodium 135 - 145 mmol/L 137  137  138   Potassium 3.5 - 5.1 mmol/L 3.6  3.4  3.3   Chloride 98 - 111 mmol/L 108  108  107   CO2 22 - 32 mmol/L 23  21  21    Calcium 8.9 - 10.3 mg/dL 8.6  7.6  9.0   Total Protein 6.5 - 8.1 g/dL 5.7  5.9    Total Bilirubin 0.3 - 1.2 mg/dL 0.1  0.7    Alkaline Phos 38 - 126 U/L 52  55    AST 15 - 41 U/L 25  66    ALT 0 - 44 U/L 21  27     Lipid  Panel     Component Value Date/Time   CHOL 222 (H) 11/18/2021 1037   TRIG 116 11/18/2021 1037   HDL 66 11/18/2021 1037   CHOLHDL 3.4 11/18/2021 1037   CHOLHDL 4.2 05/19/2013 1703   VLDL 38 05/19/2013  1703   LDLCALC 136 (H) 11/18/2021 1037    CBC    Component Value Date/Time   WBC 8.2 10/19/2022 1632   WBC 5.8 05/15/2022 0454   RBC 4.69 10/19/2022 1632   RBC 3.72 (L) 05/15/2022 0454   RBC 3.71 (L) 05/15/2022 0454   HGB 12.7 10/19/2022 1632   HCT 39.8 10/19/2022 1632   PLT 163 10/19/2022 1632   MCV 85 10/19/2022 1632   MCH 27.1 10/19/2022 1632   MCH 29.0 05/15/2022 0454   MCHC 31.9 10/19/2022 1632   MCHC 33.0 05/15/2022 0454   RDW 13.8 10/19/2022 1632   LYMPHSABS 1.7 05/15/2022 0454   MONOABS 0.5 05/15/2022 0454   EOSABS 0.2 05/15/2022 0454   BASOSABS 0.0 05/15/2022 0454    ASSESSMENT AND PLAN: 1. Type 2 diabetes mellitus without complication, with long-term current use of insulin Patient's blood sugar readings are very much improved.  Encouraged her to continue Tresiba 55 units daily, metformin 1 g twice a day and Aspart insulin 6 units 3 times a day with meals. - insulin degludec (TRESIBA) 100 UNIT/ML FlexTouch Pen; Inject 55 Units into the skin daily.  Dispense: 15 mL; Refill: 2 - POCT glucose (manual entry) - POCT glycosylated hemoglobin (Hb A1C)  2. Iron deficiency anemia due to chronic blood loss Continue iron several days a wk. Lab is currently closed for today.  I request that she returns to the lab next week for [place] to recheck her iron level.  She is agreeable to doing so. - Iron, TIBC and Ferritin Panel; Future    Patient was given the opportunity to ask questions.  Patient verbalized understanding of the plan and was able to repeat key elements of the plan.   This documentation was completed using Radio producer.  Any transcriptional errors are unintentional.  Orders Placed This Encounter  Procedures   Iron, TIBC and Ferritin Panel    POCT glucose (manual entry)   POCT glycosylated hemoglobin (Hb A1C)     Requested Prescriptions   Signed Prescriptions Disp Refills   insulin degludec (TRESIBA) 100 UNIT/ML FlexTouch Pen 15 mL 2    Sig: Inject 55 Units into the skin daily.    Return in about 3 months (around 04/19/2023).  Karle Plumber, MD, FACP

## 2023-01-30 ENCOUNTER — Telehealth: Payer: Self-pay | Admitting: Internal Medicine

## 2023-01-30 ENCOUNTER — Other Ambulatory Visit: Payer: Self-pay

## 2023-01-30 MED ORDER — INSULIN LISPRO (1 UNIT DIAL) 100 UNIT/ML (KWIKPEN): 6.0000 [IU] | PEN_INJECTOR | Freq: Three times a day (TID) | SUBCUTANEOUS | 1 refills | Status: DC

## 2023-01-30 MED ORDER — TOUJEO SOLOSTAR 300 UNIT/ML ~~LOC~~ SOPN: 55.0000 [IU] | PEN_INJECTOR | Freq: Every day | SUBCUTANEOUS | 3 refills | Status: DC

## 2023-01-30 NOTE — Telephone Encounter (Signed)
Pt was prescribed insulin at her last appointment and pt says the insulin is too expensive. Pt hasn't had insulin since 01/19/23 and wants to know is there a cheaper option or what they can do because they have worries that their sugar will go out. Please follow up with pt.

## 2023-01-30 NOTE — Addendum Note (Signed)
Addended by: Lois Huxley, Jeannett Senior L on: 01/30/2023 04:40 PM   Modules accepted: Orders

## 2023-01-31 ENCOUNTER — Ambulatory Visit: Payer: Commercial Managed Care - PPO | Attending: Internal Medicine

## 2023-01-31 DIAGNOSIS — D500 Iron deficiency anemia secondary to blood loss (chronic): Secondary | ICD-10-CM

## 2023-02-01 LAB — IRON,TIBC AND FERRITIN PANEL
Ferritin: 51 ng/mL (ref 15–150)
Iron Saturation: 12 % — ABNORMAL LOW (ref 15–55)
Iron: 32 ug/dL (ref 27–159)
Total Iron Binding Capacity: 268 ug/dL (ref 250–450)
UIBC: 236 ug/dL (ref 131–425)

## 2023-04-19 ENCOUNTER — Encounter (HOSPITAL_COMMUNITY): Payer: Self-pay

## 2023-04-19 ENCOUNTER — Other Ambulatory Visit: Payer: Self-pay

## 2023-04-19 ENCOUNTER — Emergency Department (HOSPITAL_COMMUNITY)
Admission: EM | Admit: 2023-04-19 | Discharge: 2023-04-19 | Disposition: A | Discharge status: Home / Self Care | Payer: Commercial Managed Care - PPO | Attending: Emergency Medicine | Admitting: Emergency Medicine

## 2023-04-19 DIAGNOSIS — E119 Type 2 diabetes mellitus without complications: Secondary | ICD-10-CM | POA: Insufficient documentation

## 2023-04-19 DIAGNOSIS — Z7984 Long term (current) use of oral hypoglycemic drugs: Secondary | ICD-10-CM | POA: Diagnosis not present

## 2023-04-19 DIAGNOSIS — Z794 Long term (current) use of insulin: Secondary | ICD-10-CM | POA: Diagnosis not present

## 2023-04-19 DIAGNOSIS — L02415 Cutaneous abscess of right lower limb: Secondary | ICD-10-CM | POA: Diagnosis present

## 2023-04-19 DIAGNOSIS — L0291 Cutaneous abscess, unspecified: Secondary | ICD-10-CM

## 2023-04-19 LAB — AEROBIC CULTURE W GRAM STAIN (SUPERFICIAL SPECIMEN)

## 2023-04-19 MED ORDER — DOXYCYCLINE HYCLATE 100 MG PO CAPS: 100.0000 mg | ORAL_CAPSULE | Freq: Two times a day (BID) | ORAL | 0 refills | Status: AC

## 2023-04-19 MED ORDER — DOXYCYCLINE HYCLATE 100 MG PO TABS
100.0000 mg | ORAL_TABLET | Freq: Once | ORAL | Status: AC
  Administered 2023-04-19: 100 mg via ORAL
  Filled 2023-04-19: qty 1

## 2023-04-19 MED ORDER — OXYCODONE-ACETAMINOPHEN 5-325 MG PO TABS
1.0000 | ORAL_TABLET | Freq: Once | ORAL | Status: AC
  Administered 2023-04-19: 1 via ORAL
  Filled 2023-04-19: qty 1

## 2023-04-19 NOTE — Discharge Instructions (Addendum)
Followup with urgent care or ED in order to remove your packing in 48-72 hours. You may return to the emergency department if you have  a fever that persists greater than 101 or your abscess appears to become infected (growing surrounding redness and warmth). Take entire course of antibiotics even if symptoms are resolving. Seek emergency care if experiencing any new or worsening symptoms

## 2023-04-19 NOTE — ED Triage Notes (Addendum)
Patient has had an abscess behind her right thigh for 4 days. Redness around the site. No drainage. Painful to ambulate. Same thing happened a year ago and she had to get it surgically removed.

## 2023-04-19 NOTE — ED Provider Notes (Signed)
[place] EMERGENCY DEPARTMENT AT [facility] Provider Note   CSN: [id number] Arrival date & time: 04/19/23  1126     History  Chief Complaint  Patient presents with   Abscess    Brenda Greer is a 37 y.o. female who presents to ED concerned for abscess x4 days. Patient with large abscess last year that required inpatient ABX and surgical intervention. Patient not aware or any bug bites or predisposing factors.  Denies fever, chest pain, dyspnea, abdominal pain, nausea, vomiting.  Engineer, structural used.   Abscess      Home Medications Prior to Admission medications   Medication Sig Start Date End Date Taking? Authorizing Provider  doxycycline (VIBRAMYCIN) 100 MG capsule Take 1 capsule (100 mg total) by mouth 2 (two) times daily for 10 days. 04/19/23 04/29/23 Yes Wissam Resor, Charlotte Sanes F, PA-C  Blood Glucose Monitoring Suppl (ONETOUCH VERIO) w/Device KIT Use as instructed to check blood sugar three times daily. E11.42 11/18/21   Marcine Matar, MD  Continuous Blood Gluc Receiver (DEXCOM G7 RECEIVER) DEVI UAD 10/19/22   Marcine Matar, MD  Continuous Blood Gluc Sensor (DEXCOM G7 SENSOR) MISC Change device Q 10 days/ 10/19/22   Marcine Matar, MD  ferrous sulfate 325 (65 FE) MG tablet Take 1 tablet (325 mg total) by mouth daily with breakfast. 07/03/22   Marcine Matar, MD  fluconazole (DIFLUCAN) 150 MG tablet Take 1 tablet (150 mg total) by mouth daily. 10/19/22   Marcine Matar, MD  glucose blood (ONETOUCH VERIO) test strip Use as instructed to check blood sugar three times daily. E11.42 07/17/22   Marcine Matar, MD  insulin glargine, 1 Unit Dial, (TOUJEO SOLOSTAR) 300 UNIT/ML Solostar Pen Inject 55 Units into the skin daily. 01/30/23   Marcine Matar, MD  insulin lispro (HUMALOG KWIKPEN) 100 UNIT/ML KwikPen Inject 6 Units into the skin 3 (three) times daily. 01/30/23   Marcine Matar, MD  Insulin Pen Needle (BD PEN NEEDLE NANO U/F) 32G X 4  MM MISC Use to inject insulin in the morning and bedtime 05/15/22   Sheikh, Omair Latif, DO  metFORMIN (GLUCOPHAGE-XR) 500 MG 24 hr tablet Take 2 tablets (1,000 mg total) by mouth 2 (two) times daily with a meal. 07/17/22   Marcine Matar, MD  nystatin-triamcinolone (MYCOLOG II) cream Apply 1 Application topically 2 (two) times daily. 10/19/22   Marcine Matar, MD  OneTouch Delica Lancets 33G MISC Use as instructed to check blood sugar three times daily. E11.42 07/04/22   Marcine Matar, MD  pravastatin (PRAVACHOL) 20 MG tablet Take 1 tablet (20 mg total) by mouth at bedtime. 07/17/22   Marcine Matar, MD      Allergies    Patient has no known allergies.    Review of Systems   Review of Systems  Skin:        abscess    Physical Exam Updated Vital Signs BP 123/74 (BP Location: Left Arm)   Pulse (!) 111   Temp 97.6 F (36.4 C) (Oral)   Resp 16   Ht 5\' 4"  (1.626 m)   Wt 72.6 kg   SpO2 97%   BMI 27.46 kg/m  Physical Exam Vitals and nursing note reviewed.  Constitutional:      General: She is not in acute distress.    Appearance: She is not ill-appearing or toxic-appearing.  HENT:     Head: Normocephalic and atraumatic.  Eyes:     General: No scleral  icterus.       Right eye: No discharge.        Left eye: No discharge.     Conjunctiva/sclera: Conjunctivae normal.  Cardiovascular:     Rate and Rhythm: Normal rate and regular rhythm.     Heart sounds: Normal heart sounds.  Pulmonary:     Effort: Pulmonary effort is normal.  Abdominal:     General: Abdomen is flat.  Musculoskeletal:     Comments: +2 pedal pulses. Sensation to light touch intact.  Skin:    General: Skin is warm and dry.     Comments: Posterior right thigh is warm to touch, hardened, with mild fluctuance palpated. [place] of area revealed 1cm fluid collection.  Abscess drained with mild-moderate amount of purulence. Wick placed in stab incision.  Neurological:     General: No focal deficit present.      Mental Status: She is alert. Mental status is at baseline.  Psychiatric:        Mood and Affect: Mood normal.        Behavior: Behavior normal.     ED Results / Procedures / Treatments   Labs (all labs ordered are listed, but only abnormal results are displayed) Labs Reviewed  AEROBIC CULTURE W GRAM STAIN (SUPERFICIAL SPECIMEN)    EKG None  Radiology No results found.  Procedures .Marland KitchenIncision and Drainage  Date/Time: 04/19/2023 12:36 PM  Performed by: Dorthy Cooler, PA-C Authorized by: Dorthy Cooler, PA-C   Consent:    Consent obtained:  Verbal   Consent given by:  Patient   Risks, benefits, and alternatives were discussed: yes     Risks discussed:  Bleeding, damage to other organs, incomplete drainage, infection and pain   Alternatives discussed:  No treatment Universal protocol:    Patient identity confirmed:  Verbally with patient Location:    Type:  Abscess   Size:  1cm   Location:  Lower extremity   Lower extremity location:  Leg   Leg location:  R upper leg Pre-procedure details:    Skin preparation:  Povidone-iodine Sedation:    Sedation type:  None Anesthesia:    Anesthesia method:  Local infiltration   Local anesthetic:  Lidocaine 2% w/o epi Procedure type:    Complexity:  Simple Procedure details:    Incision types:  Stab incision   Drainage:  Purulent and bloody   Drainage amount:  Moderate   Wound treatment:  Wound left open   Packing materials:  1/4 in iodoform gauze   Amount 1/4" iodoform:  2cm Post-procedure details:    Procedure completion:  Tolerated well, no immediate complications     Medications Ordered in ED Medications  oxyCODONE-acetaminophen (PERCOCET/ROXICET) 5-325 MG per tablet 1 tablet (1 tablet Oral Given 04/19/23 1233)  doxycycline (VIBRA-TABS) tablet 100 mg (100 mg Oral Given 04/19/23 1233)    ED Course/ Medical Decision Making/ A&P                             Medical Decision Making Risk Prescription drug  management.  This patient presents to the ED for concern of abscess, this involves an extensive number of treatment options, and is a complaint that carries with it a high risk of complications and morbidity.  The differential diagnosis includes abscess/deep space infection, sepsis, N/V compromise   Co morbidities that complicate the patient evaluation  DM without complication    Lab Tests:  I Ordered, and personally  interpreted labs.  The pertinent results include:   -Wound culture: pending    Problem List / ED Course / Critical interventions / Medication management  Spanish translator used. Patient presents to ED concerned for abscess. Physical exam concerning for cellulitis vs abscess. US showing 1cm fluid collection/abscess which was successfully drained. I&D without complication. Right keg neurovascularly intact. Purulent cultures sent. 2cm wick placed in wound.  Patient afebrile with stable vitals. Denying any other complaint today. Discharging patient in good condition with ABX course. Also provided patient one dose of  Doxy here in ED. Educated patient return to ED in 48hrs for wound recheck and wick removal. Educated patient on alternating tylenol and ibuprofen for pain management. Patient verbalized understanding of plan. I have reviewed the patients home medicines and have made adjustments as needed   Social Determinants of Health:  Foreign language            Final Clinical Impression(s) / ED Diagnoses Final diagnoses:  Abscess    Rx / DC Orders ED Discharge Orders          Ordered    doxycycline (VIBRAMYCIN) 100 MG capsule  2 times daily        04/19/23 1301              Dorthy Cooler, [place] 04/19/23 1325    Arby Barrette, MD 04/19/23 1434

## 2023-04-19 NOTE — ED Provider Notes (Signed)
I provided a substantive portion of the care of this patient.  I personally made/approved the management plan for this patient and take responsibility for the patient management.     Patient has an abscess on the back of her right thigh.  Is been developing 4 days.  Very tender red area.  Nothing triggered it that she is aware of.  She did have a large abscess a year ago and had to have surgical intervention.  Patient has indurated firm deep erythema with small bullae of purulence centrally.  Site ultrasound used to identify a small pocket of fluid about 1 cm within this indurated lesion.  With repeat palpation there is some slight fluctuance present.  Most of ultrasound image is consistent with cellulitis with extensive cobblestoning in the area.  Recommend incision and drainage and identified pocket by ultrasound.   Arby Barrette, MD 04/19/23 1215

## 2023-04-20 LAB — AEROBIC CULTURE W GRAM STAIN (SUPERFICIAL SPECIMEN)

## 2023-04-21 ENCOUNTER — Emergency Department (HOSPITAL_COMMUNITY)
Admission: EM | Admit: 2023-04-21 | Discharge: 2023-04-21 | Disposition: A | Discharge status: Home / Self Care | Payer: Commercial Managed Care - PPO | Attending: Emergency Medicine | Admitting: Emergency Medicine

## 2023-04-21 DIAGNOSIS — Z7984 Long term (current) use of oral hypoglycemic drugs: Secondary | ICD-10-CM | POA: Insufficient documentation

## 2023-04-21 DIAGNOSIS — Z4800 Encounter for change or removal of nonsurgical wound dressing: Secondary | ICD-10-CM | POA: Diagnosis not present

## 2023-04-21 DIAGNOSIS — Z79899 Other long term (current) drug therapy: Secondary | ICD-10-CM | POA: Diagnosis not present

## 2023-04-21 DIAGNOSIS — L03115 Cellulitis of right lower limb: Secondary | ICD-10-CM | POA: Diagnosis not present

## 2023-04-21 DIAGNOSIS — L02415 Cutaneous abscess of right lower limb: Secondary | ICD-10-CM | POA: Diagnosis not present

## 2023-04-21 DIAGNOSIS — Z5189 Encounter for other specified aftercare: Secondary | ICD-10-CM

## 2023-04-21 DIAGNOSIS — Z794 Long term (current) use of insulin: Secondary | ICD-10-CM | POA: Diagnosis not present

## 2023-04-21 LAB — AEROBIC CULTURE W GRAM STAIN (SUPERFICIAL SPECIMEN)

## 2023-04-21 MED ORDER — BACITRACIN ZINC 500 UNIT/GM EX OINT
TOPICAL_OINTMENT | Freq: Once | CUTANEOUS | Status: AC
  Administered 2023-04-21: 1 via TOPICAL
  Filled 2023-04-21: qty 0.9

## 2023-04-21 NOTE — ED Provider Notes (Signed)
[place] EMERGENCY DEPARTMENT AT [facility] Provider Note   CSN: [id number] Arrival date & time: 04/21/23  1042     History  Chief Complaint  Patient presents with   Abscess    Follow up    Brenda Greer is a 37 y.o. female returns to the ED for wound reevaluation and to remove packing.  Patient has been taking antibiotic as prescribed.  Patient has also been changing her dressings and notes improvement in the area.  Patient still does have some pain, but it is much better than before.  She has been taking Aleve for pain management.  Denies fever.       Home Medications Prior to Admission medications   Medication Sig Start Date End Date Taking? Authorizing Provider  Blood Glucose Monitoring Suppl (ONETOUCH VERIO) w/Device KIT Use as instructed to check blood sugar three times daily. J47.82 11/18/21   Marcine Matar, MD  Continuous Blood Gluc Receiver (DEXCOM G7 RECEIVER) DEVI UAD 10/19/22   Marcine Matar, MD  Continuous Blood Gluc Sensor (DEXCOM G7 SENSOR) MISC Change device Q 10 days/ 10/19/22   Marcine Matar, MD  doxycycline (VIBRAMYCIN) 100 MG capsule Take 1 capsule (100 mg total) by mouth 2 (two) times daily for 10 days. 04/19/23 04/29/23  Dorthy Cooler, PA-C  ferrous sulfate 325 (65 FE) MG tablet Take 1 tablet (325 mg total) by mouth daily with breakfast. 07/03/22   Marcine Matar, MD  fluconazole (DIFLUCAN) 150 MG tablet Take 1 tablet (150 mg total) by mouth daily. 10/19/22   Marcine Matar, MD  glucose blood (ONETOUCH VERIO) test strip Use as instructed to check blood sugar three times daily. E11.42 07/17/22   Marcine Matar, MD  insulin glargine, 1 Unit Dial, (TOUJEO SOLOSTAR) 300 UNIT/ML Solostar Pen Inject 55 Units into the skin daily. 01/30/23   Marcine Matar, MD  insulin lispro (HUMALOG KWIKPEN) 100 UNIT/ML KwikPen Inject 6 Units into the skin 3 (three) times daily. 01/30/23   Marcine Matar, MD  Insulin Pen Needle (BD  PEN NEEDLE NANO U/F) 32G X 4 MM MISC Use to inject insulin in the morning and bedtime 05/15/22   Sheikh, Omair Latif, DO  metFORMIN (GLUCOPHAGE-XR) 500 MG 24 hr tablet Take 2 tablets (1,000 mg total) by mouth 2 (two) times daily with a meal. 07/17/22   Marcine Matar, MD  nystatin-triamcinolone (MYCOLOG II) cream Apply 1 Application topically 2 (two) times daily. 10/19/22   Marcine Matar, MD  OneTouch Delica Lancets 33G MISC Use as instructed to check blood sugar three times daily. E11.42 07/04/22   Marcine Matar, MD  pravastatin (PRAVACHOL) 20 MG tablet Take 1 tablet (20 mg total) by mouth at bedtime. 07/17/22   Marcine Matar, MD      Allergies    Patient has no known allergies.    Review of Systems   Review of Systems  Constitutional:  Negative for fever.  Skin:  Positive for wound.    Physical Exam Updated Vital Signs BP 123/82 (BP Location: Left Arm)   Pulse (!) 101   Temp 98 F (36.7 C) (Oral)   Resp 18   Ht 5\' 4"  (1.626 m)   Wt 72.6 kg   SpO2 99%   BMI 27.46 kg/m  Physical Exam Vitals and nursing note reviewed.  Constitutional:      General: She is not in acute distress.    Appearance: Normal appearance. She is not ill-appearing or  diaphoretic.  Cardiovascular:     Rate and Rhythm: Normal rate and regular rhythm.  Pulmonary:     Effort: Pulmonary effort is normal.  Musculoskeletal:     Right upper leg: Swelling and tenderness present.     Comments: Erythema and induration surrounding previously drained abscess.  Does not appear to be worse based on previous imaging.  Area is tender to palpation.  Packing is in place.  Surface of wound with small blisters.    Neurological:     Mental Status: She is alert. Mental status is at baseline.  Psychiatric:        Mood and Affect: Mood normal.        Behavior: Behavior normal.     ED Results / Procedures / Treatments   Labs (all labs ordered are listed, but only abnormal results are displayed) Labs Reviewed  - No data to display  EKG None  Radiology No results found.  Procedures Procedures    Medications Ordered in ED Medications - No data to display  ED Course/ Medical Decision Making/ A&P                             Medical Decision Making  This patient presents to the ED with chief complaint(s) of wound re-check, remove packing with pertinent past medical history of recent I&D, cellulitis of right upper leg.  The complaint involves an extensive differential diagnosis and also carries with it a high risk of complications and morbidity.    Initial Assessment:   Exam significant for erythema and induration to posterior right upper leg consistent with abscess and surrounding cellulitis.  Area is tender to palpation.  Erythema does not appear to be worse than 2 days prior when reviewing images.    Treatment and Reassessment: Wound packing was successfully removed.  There is still a small amount of drainage.  Drainage appears to be bloody and small amount of purulence.  There is no foul odor from the wound.  There was blister appearance and a small amount of skin sloughed off when removing dressing and packing.  Wound was redressed with antibiotic ointment.    Disposition:   Discussed continued wound care with patient.  Patient to keep taking prescribed doxycycline.  Advised patient to follow up with primary care provider in coming week for another wound re-check.  The patient has been appropriately medically screened and/or stabilized in the ED. I have low suspicion for any other emergent medical condition which would require further screening, evaluation or treatment in the ED or require inpatient management. At time of discharge the patient is hemodynamically stable and in no acute distress. I have discussed work-up results and diagnosis with patient and answered all questions. Patient is agreeable with discharge plan. We discussed strict return precautions for returning to the emergency  department and they verbalized understanding.            Final Clinical Impression(s) / ED Diagnoses Final diagnoses:  None    Rx / DC Orders ED Discharge Orders     None         Lenard Simmer, PA-C 04/21/23 1218    Jacalyn Lefevre, MD 04/21/23 1535

## 2023-04-21 NOTE — ED Triage Notes (Signed)
Patient had I&D Right upper thigh Is here for follow up  Denies pain in triage

## 2023-04-21 NOTE — Discharge Instructions (Signed)
Thank you for allowing [place] to be a part of your care today.  Your wound was re-evaluated in the ED and the packing material removed.  This area will continue to drain on its own.    Continue taking your antibiotic as prescribed for the entire 10 day course.  Schedule a follow up with your primary care doctor for another wound check in the next few days.  Keep the area clean and covered.  I recommend using antibiotic ointment each time you change the bandage.   Return to the ED if you develop sudden worsening of your symptoms or if you have any new concerns.   Gracias por permitirnos ser parte de su atencin hoy.  Su herida fue reevaluada en el servicio de urgencias y se retir [employer].  Esta zona seguir drenando por s sola.    Contine tomando el antibitico segn lo recetado durante todo el tratamiento de [address].  Programe una cita de seguimiento con su mdico de atencin primaria para otro control de la herida en los [employer].  Mantenga el rea limpia y [place].  Recomiendo usar ungento antibitico cada vez que cambie el vendaje.   Regrese al servicio de urgencias si presenta un empeoramiento repentino de sus sntomas o si tiene alguna inquietud nueva.

## 2023-04-22 LAB — AEROBIC CULTURE W GRAM STAIN (SUPERFICIAL SPECIMEN)

## 2023-04-23 ENCOUNTER — Telehealth (HOSPITAL_BASED_OUTPATIENT_CLINIC_OR_DEPARTMENT_OTHER): Payer: Self-pay | Admitting: *Deleted

## 2023-04-23 NOTE — Telephone Encounter (Signed)
Post ED Visit - Positive Culture Follow-up  Culture report reviewed by antimicrobial stewardship pharmacist: [facility] []  Enzo Bi, Pharm.D. []  Celedonio Miyamoto, Pharm.D., BCPS AQ-ID []  Garvin Fila, Pharm.D., BCPS [x]  Georgina Pillion, [address].D., BCPS []  [place], [address].D., BCPS, AAHIVP []  Estella Husk, Pharm.D., BCPS, AAHIVP []  Lysle Pearl, PharmD, BCPS []  Phillips Climes, PharmD, BCPS []  Agapito Games, PharmD, BCPS []  Verlan Friends, PharmD []  Mervyn Gay, PharmD, BCPS []  Vinnie Level, PharmD  [facility] []  Len Childs, PharmD []  Greer Pickerel, PharmD []  Adalberto Cole, PharmD []  Perlie Gold, Rph []  Lonell Face) Jean Rosenthal, PharmD []  Earl Many, PharmD []  Junita Push, PharmD []  Dorna Leitz, PharmD []  Terrilee Files, PharmD []  Lynann Beaver, PharmD []  Keturah Barre, PharmD []  Loralee Pacas, PharmD []  Bernadene Person, PharmD   Positive wound culture Treated with Doxycycline Hyclate, organism sensitive to the same and no further patient follow-up is required at this time.  Virl Axe [facility] 04/23/2023, 8:20 AM

## 2023-04-24 ENCOUNTER — Ambulatory Visit: Payer: Commercial Managed Care - PPO | Admitting: Internal Medicine

## 2023-07-29 ENCOUNTER — Other Ambulatory Visit: Payer: Self-pay | Admitting: Internal Medicine

## 2023-07-29 DIAGNOSIS — Z794 Long term (current) use of insulin: Secondary | ICD-10-CM

## 2023-07-29 DIAGNOSIS — E1169 Type 2 diabetes mellitus with other specified complication: Secondary | ICD-10-CM

## 2023-07-30 NOTE — Telephone Encounter (Signed)
Requested medication (s) are due for refill today: expired medications  Requested medication (s) are on the active medication list: yes   Last refill:  07/17/22 #120 5 refills -  metformin, 07/17/22 #90 1 refills- pravachol  Future visit scheduled: yes in 1 week   Notes to clinic:  expired medications. Do you want to renew Rxs?     Requested Prescriptions  Pending Prescriptions Disp Refills   metFORMIN (GLUCOPHAGE-XR) 500 MG 24 hr tablet [Pharmacy Med Name: metFORMIN HCl ER 500 MG Oral Tablet Extended Release 24 Hour] 120 tablet 0    Sig: TAKE 2 TABLETS BY MOUTH TWICE DAILY WITH MEALS     Endocrinology:  Diabetes - Biguanides Failed - 07/29/2023 10:11 PM      Failed - Cr in normal range and within 360 days    Creat  Date Value Ref Range Status  05/19/2013 0.60 0.50 - 1.10 mg/dL Final   Creatinine, Ser  Date Value Ref Range Status  05/15/2022 0.33 (L) 0.44 - 1.00 mg/dL Final   Creatinine, POC  Date Value Ref Range Status  04/09/2017 100 mg/dL Final   Creatinine, Urine  Date Value Ref Range Status  05/19/2013 133.3 mg/dL Final         Failed - HBA1C is between 0 and 7.9 and within 180 days    HbA1c, POC (controlled diabetic range)  Date Value Ref Range Status  01/18/2023 9.2 (A) 0.0 - 7.0 % Final         Failed - eGFR in normal range and within 360 days    GFR, Est African American  Date Value Ref Range Status  05/19/2013 >89 mL/min Final   GFR calc Af Amer  Date Value Ref Range Status  11/25/2019 149 >59 mL/min/1.73 Final   GFR, Est Non African American  Date Value Ref Range Status  05/19/2013 >89 mL/min Final    Comment:      The estimated GFR is a calculation valid for adults (>=37 years old) that uses the CKD-EPI algorithm to adjust for age and sex. It is   not to be used for children, pregnant women, hospitalized patients,    patients on dialysis, or with rapidly changing kidney function. According to the NKDEP, eGFR >89 is normal, 60-89 shows  mild impairment, 30-59 shows moderate impairment, 15-29 shows severe impairment and <15 is ESRD.     GFR, Estimated  Date Value Ref Range Status  05/15/2022 >60 >60 mL/min Final    Comment:    (NOTE) Calculated using the CKD-EPI Creatinine Equation (2021)    eGFR  Date Value Ref Range Status  11/18/2021 126 >59 mL/min/1.73 Final         Failed - Valid encounter within last 6 months    Recent Outpatient Visits           6 months ago Type 2 diabetes mellitus without complication, with long-term current use of insulin (HCC)   [facility] Jonah Blue B, MD   7 months ago Type 2 diabetes mellitus with hyperglycemia, with long-term current use of insulin [facility])   [facility] Burgess, Covington L, RPH-CPP   8 months ago Type 2 diabetes mellitus with hyperglycemia, with long-term current use of insulin [facility])    [facility] [place], Jeannett Senior L, RPH-CPP   9 months ago Type 2 diabetes mellitus with hyperglycemia, with long-term current use of insulin (HCC)     [facility] Jonah Blue B, MD   1 year ago Type 2 diabetes mellitus with hyperglycemia, with long-term current use of insulin [facility])   [facility] Lois Huxley, Cornelius Moras, RPH-CPP       Future Appointments             In 1 week Marcine Matar, MD [facility]            Failed - CBC within normal limits and completed in the last 12 months    WBC  Date Value Ref Range Status  10/19/2022 8.2 3.4 - 10.8 x10E3/uL Final  05/15/2022 5.8 4.0 - 10.5 K/uL Final   RBC  Date Value Ref Range Status  10/19/2022 4.69 3.77 - 5.28 x10E6/uL Final  05/15/2022 3.72 (L) 3.87 - 5.11 MIL/uL Final   RBC.  Date Value Ref Range Status  05/15/2022 3.71 (L) 3.87 - 5.11 MIL/uL Final   Hemoglobin  Date Value Ref Range  Status  10/19/2022 12.7 11.1 - 15.9 g/dL Final   Hematocrit  Date Value Ref Range Status  10/19/2022 39.8 34.0 - 46.6 % Final   MCHC  Date Value Ref Range Status  10/19/2022 31.9 31.5 - 35.7 g/dL Final  16/07/9603 54.0 30.0 - 36.0 g/dL Final   [facility]  Date Value Ref Range Status  10/19/2022 27.1 26.6 - 33.0 pg Final  05/15/2022 29.0 26.0 - 34.0 pg Final   MCV  Date Value Ref Range Status  10/19/2022 85 79 - 97 fL Final   No results found for: "PLTCOUNTKUC", "LABPLAT", "POCPLA" RDW  Date Value Ref Range Status  10/19/2022 13.8 11.7 - 15.4 % Final         Passed - B12 Level in normal range and within 720 days    Vitamin B-12  Date Value Ref Range Status  05/15/2022 416 180 - 914 pg/mL Final    Comment:    (NOTE) This assay is not validated for testing neonatal or myeloproliferative syndrome specimens for Vitamin B12 levels. Performed at [facility], [address], [place], [place] [address]           pravastatin (PRAVACHOL) 20 MG tablet [Pharmacy Med Name: Pravastatin Sodium 20 MG Oral Tablet] 30 tablet 0    Sig: TAKE 1 TABLET BY MOUTH AT BEDTIME     Cardiovascular:  Antilipid - Statins Failed - 07/29/2023 10:11 PM      Failed - Lipid Panel in normal range within the last 12 months    Cholesterol, Total  Date Value Ref Range Status  11/18/2021 222 (H) 100 - 199 mg/dL Final   LDL Chol Calc (NIH)  Date Value Ref Range Status  11/18/2021 136 (H) 0 - 99 mg/dL Final   HDL  Date Value Ref Range Status  11/18/2021 66 >39 mg/dL Final   Triglycerides  Date Value Ref Range Status  11/18/2021 116 0 - 149 mg/dL Final         Passed - Patient is not pregnant      Passed - Valid encounter within last 12 months    Recent Outpatient Visits           6 months ago Type 2 diabetes mellitus without complication, with long-term current use of insulin (HCC)   [facility] Jonah Blue B, MD   7 months ago Type 2  diabetes mellitus with hyperglycemia, with long-term current use  of insulin [facility])   [facility] [place], Jeannett Senior L, RPH-CPP   8 months ago Type 2 diabetes mellitus with hyperglycemia, with long-term current use of insulin [facility])   [place] [facility] [place], Jeannett Senior L, RPH-CPP   9 months ago Type 2 diabetes mellitus with hyperglycemia, with long-term current use of insulin [facility])   [place] [facility] Jonah Blue B, MD   1 year ago Type 2 diabetes mellitus with hyperglycemia, with long-term current use of insulin [facility])   [facility] Drucilla Chalet, RPH-CPP       Future Appointments             In 1 week Marcine Matar, MD [facility]

## 2023-08-03 ENCOUNTER — Emergency Department (HOSPITAL_COMMUNITY)
Admission: EM | Admit: 2023-08-03 | Discharge: 2023-08-04 | Disposition: A | Discharge status: Home / Self Care | Payer: Commercial Managed Care - PPO | Attending: Emergency Medicine | Admitting: Emergency Medicine

## 2023-08-03 DIAGNOSIS — L03115 Cellulitis of right lower limb: Secondary | ICD-10-CM | POA: Diagnosis not present

## 2023-08-03 DIAGNOSIS — L0291 Cutaneous abscess, unspecified: Secondary | ICD-10-CM

## 2023-08-03 DIAGNOSIS — Z794 Long term (current) use of insulin: Secondary | ICD-10-CM | POA: Diagnosis not present

## 2023-08-03 DIAGNOSIS — E1165 Type 2 diabetes mellitus with hyperglycemia: Secondary | ICD-10-CM | POA: Insufficient documentation

## 2023-08-03 DIAGNOSIS — Z7984 Long term (current) use of oral hypoglycemic drugs: Secondary | ICD-10-CM | POA: Diagnosis not present

## 2023-08-03 DIAGNOSIS — L02415 Cutaneous abscess of right lower limb: Secondary | ICD-10-CM | POA: Diagnosis present

## 2023-08-03 DIAGNOSIS — R739 Hyperglycemia, unspecified: Secondary | ICD-10-CM

## 2023-08-03 DIAGNOSIS — Z8616 Personal history of COVID-19: Secondary | ICD-10-CM | POA: Diagnosis not present

## 2023-08-03 LAB — COMPREHENSIVE METABOLIC PANEL
ALT: 64 U/L — ABNORMAL HIGH (ref 0–44)
AST: 56 U/L — ABNORMAL HIGH (ref 15–41)
Albumin: 3.6 g/dL (ref 3.5–5.0)
Alkaline Phosphatase: 66 U/L (ref 38–126)
Anion gap: 11 (ref 5–15)
BUN: 8 mg/dL (ref 6–20)
CO2: 19 mmol/L — ABNORMAL LOW (ref 22–32)
Calcium: 9.3 mg/dL (ref 8.9–10.3)
Chloride: 99 mmol/L (ref 98–111)
Creatinine, Ser: 0.53 mg/dL (ref 0.44–1.00)
GFR, Estimated: >60 mL/min (ref >60)
Glucose, Bld: 397 mg/dL — ABNORMAL HIGH (ref 70–99)
Potassium: 4.1 mmol/L (ref 3.5–5.1)
Sodium: 129 mmol/L — ABNORMAL LOW (ref 135–145)
Total Bilirubin: 1 mg/dL (ref 0.3–1.2)
Total Protein: 7.9 g/dL (ref 6.5–8.1)

## 2023-08-03 LAB — CBC WITH DIFFERENTIAL/PLATELET
Abs Immature Granulocytes: 0.05 10*3/uL (ref 0.00–0.07)
Basophils Absolute: 0 10*3/uL (ref 0.0–0.1)
Basophils Relative: 0 %
Eosinophils Absolute: 0.1 10*3/uL (ref 0.0–0.5)
Eosinophils Relative: 0 %
HCT: 38.8 % (ref 36.0–46.0)
Hemoglobin: 12.9 g/dL (ref 12.0–15.0)
Immature Granulocytes: 0 %
Lymphocytes Relative: 10 %
Lymphs Abs: 1.3 10*3/uL (ref 0.7–4.0)
MCH: 29.2 pg (ref 26.0–34.0)
MCHC: 33.2 g/dL (ref 30.0–36.0)
MCV: 87.8 fL (ref 80.0–100.0)
Monocytes Absolute: 0.8 10*3/uL (ref 0.1–1.0)
Monocytes Relative: 6 %
Neutro Abs: 10.6 10*3/uL — ABNORMAL HIGH (ref 1.7–7.7)
Neutrophils Relative %: 84 %
Platelets: 175 10*3/uL (ref 150–400)
RBC: 4.42 MIL/uL (ref 3.87–5.11)
RDW: 13.7 % (ref 11.5–15.5)
WBC: 12.8 10*3/uL — ABNORMAL HIGH (ref 4.0–10.5)
nRBC: 0 % (ref 0.0–0.2)

## 2023-08-03 LAB — CBG MONITORING, ED: Glucose-Capillary: 409 mg/dL — ABNORMAL HIGH (ref 70–99)

## 2023-08-03 LAB — HCG, SERUM, QUALITATIVE: Preg, Serum: NEGATIVE

## 2023-08-03 MED ORDER — INSULIN ASPART 100 UNIT/ML IJ SOLN
8.0000 [IU] | Freq: Once | INTRAMUSCULAR | Status: AC
  Administered 2023-08-04: 8 [IU] via SUBCUTANEOUS
  Filled 2023-08-03: qty 0.08

## 2023-08-03 MED ORDER — SULFAMETHOXAZOLE-TRIMETHOPRIM 800-160 MG PO TABS
1.0000 | ORAL_TABLET | Freq: Once | ORAL | Status: AC
  Administered 2023-08-03: 1 via ORAL
  Filled 2023-08-03: qty 1

## 2023-08-03 MED ORDER — LIDOCAINE-EPINEPHRINE (PF) 2 %-1:200000 IJ SOLN
20.0000 mL | Freq: Once | INTRAMUSCULAR | Status: AC
  Administered 2023-08-03: 20 mL
  Filled 2023-08-03: qty 20

## 2023-08-03 MED ORDER — KETOROLAC TROMETHAMINE 60 MG/2ML IM SOLN
30.0000 mg | Freq: Once | INTRAMUSCULAR | Status: AC
  Administered 2023-08-03: 30 mg via INTRAMUSCULAR
  Filled 2023-08-03: qty 2

## 2023-08-03 NOTE — ED Triage Notes (Signed)
Patient here from home reporting right sided abscess to thigh with no drainage. States that she has been dealing with this for "awhile". Surgery for same 1 year ago. Diabetic.

## 2023-08-03 NOTE — ED Provider Notes (Signed)
EMERGENCY DEPARTMENT AT [facility] Provider Note  CSN: [id number] Arrival date & time: 08/03/23 1918  Chief Complaint(s) Abscess  HPI Brenda Greer is a 37 y.o. female    The history is provided by the patient.  Abscess Location:  Leg Leg abscess location:  R upper leg Size:  5 cm Abscess quality: fluctuance, induration, painful, redness and warmth   Red streaking: yes   Duration: several days. Progression:  Worsening Pain details:    Quality:  Throbbing   Severity:  Moderate   Timing:  Constant   Progression:  Worsening Relieved by:  Nothing Worsened by:  Nothing Associated symptoms: no fever   Risk factors: prior abscess    Patient also reports that she has been out of her insulin due to financial constraints, stating that her insurance company will not cover her Toujeo or lispro insulin pens.  She has been out of the medicine for 2 months.   Past Medical History Past Medical History:  Diagnosis Date   Diabetes mellitus without complication [facility])    Patient Active Problem List   Diagnosis Date Noted   Type 2 diabetes mellitus with hyperglycemia (HCC) 05/14/2022   Abdominal wall cellulitis 05/13/2022   History of herpes genitalis 11/18/2021   Microalbuminuria due to type 2 diabetes mellitus (HCC) 01/22/2021   COVID-19 vaccine series completed 09/21/2020   Uncontrolled type 2 diabetes mellitus with peripheral neuropathy 07/18/2019   Migraine without aura and without status migrainosus, not intractable 07/18/2019   Hyperlipidemia associated with type 2 diabetes mellitus (HCC) 07/18/2019   Iron deficiency anemia due to chronic blood loss 07/02/2018   Hyperlipidemia 02/26/2018   Gastroesophageal reflux disease without esophagitis 02/26/2018   Diabetes mellitus without complication (HCC) 04/09/2017   Type II or unspecified type diabetes mellitus without mention of complication, uncontrolled 05/19/2013   OBESITY, NOS 12/13/2006    Home Medication(s) Prior to Admission medications   Medication Sig Start Date End Date Taking? Authorizing Provider  insulin glargine (LANTUS) 100 UNIT/ML injection Inject 0.55 mLs (55 Units total) into the skin at bedtime. 08/04/23  Yes Adiel Mcnamara, Amadeo Garnet, MD  sulfamethoxazole-trimethoprim (BACTRIM DS) 800-160 MG tablet Take 1 tablet by mouth 2 (two) times daily for 7 days. 08/04/23 08/11/23 Yes Chalon Zobrist, Amadeo Garnet, MD  Blood Glucose Monitoring Suppl (ONETOUCH VERIO) w/Device KIT Use as instructed to check blood sugar three times daily. E11.42 11/18/21   Marcine Matar, MD  Continuous Blood Gluc Receiver (DEXCOM G7 RECEIVER) DEVI UAD 10/19/22   Marcine Matar, MD  Continuous Blood Gluc Sensor (DEXCOM G7 SENSOR) MISC Change device Q 10 days/ 10/19/22   Marcine Matar, MD  ferrous sulfate 325 (65 FE) MG tablet Take 1 tablet (325 mg total) by mouth daily with breakfast. 07/03/22   Marcine Matar, MD  fluconazole (DIFLUCAN) 150 MG tablet Take 1 tablet (150 mg total) by mouth daily. 10/19/22   Marcine Matar, MD  glucose blood (ONETOUCH VERIO) test strip Use as instructed to check blood sugar three times daily. E11.42 07/17/22   Marcine Matar, MD  insulin glargine, 1 Unit Dial, (TOUJEO SOLOSTAR) 300 UNIT/ML Solostar Pen Inject 55 Units into the skin daily. 01/30/23   Marcine Matar, MD  insulin lispro (HUMALOG KWIKPEN) 100 UNIT/ML KwikPen Inject 6 Units into the skin 3 (three) times daily. 08/04/23   CardamaAmadeo Garnet, MD  Insulin Pen Needle (BD PEN NEEDLE NANO U/F) 32G X 4 MM MISC Use to inject insulin in the morning  and bedtime 05/15/22   Sheikh, Omair Latif, DO  metFORMIN (GLUCOPHAGE-XR) 500 MG 24 hr tablet TAKE 2 TABLETS BY MOUTH TWICE DAILY WITH MEALS 07/30/23   Marcine Matar, MD  nystatin-triamcinolone (MYCOLOG II) cream Apply 1 Application topically 2 (two) times daily. 10/19/22   Marcine Matar, MD  OneTouch Delica Lancets 33G MISC Use as instructed to  check blood sugar three times daily. Z30.86 07/04/22   Marcine Matar, MD  pravastatin (PRAVACHOL) 20 MG tablet TAKE 1 TABLET BY MOUTH AT BEDTIME 07/30/23   Marcine Matar, MD                                                                                                                                    Allergies Patient has no known allergies.  Review of Systems Review of Systems  Constitutional:  Negative for fever.   As noted in HPI  Physical Exam Vital Signs  I have reviewed the triage vital signs BP 110/82 (BP Location: Right Arm)   Pulse 95   Temp 97.7 F (36.5 C) (Oral)   Resp 16   SpO2 100%   Physical Exam Vitals reviewed.  Constitutional:      General: She is not in acute distress.    Appearance: She is well-developed. She is not diaphoretic.  HENT:     Head: Normocephalic and atraumatic.     Right Ear: External ear normal.     Left Ear: External ear normal.     Nose: Nose normal.  Eyes:     General: No scleral icterus.    Conjunctiva/sclera: Conjunctivae normal.  Neck:     Trachea: Phonation normal.  Cardiovascular:     Rate and Rhythm: Normal rate and regular rhythm.  Pulmonary:     Effort: Pulmonary effort is normal. No respiratory distress.     Breath sounds: No stridor.  Abdominal:     General: There is no distension.  Musculoskeletal:        General: Normal range of motion.     Cervical back: Normal range of motion.       Legs:  Neurological:     Mental Status: She is alert and oriented to person, place, and time.  Psychiatric:        Behavior: Behavior normal.     ED Results and Treatments Labs (all labs ordered are listed, but only abnormal results are displayed) Labs Reviewed  COMPREHENSIVE METABOLIC PANEL - Abnormal; Notable for the following components:      Result Value   Sodium 129 (*)    CO2 19 (*)    Glucose, Bld 397 (*)    AST 56 (*)    ALT 64 (*)    All other components within normal limits  CBC WITH  DIFFERENTIAL/PLATELET - Abnormal; Notable for the following components:   WBC 12.8 (*)    Neutro Abs 10.6 (*)    All other  components within normal limits  CBG MONITORING, ED - Abnormal; Notable for the following components:   Glucose-Capillary 409 (*)    All other components within normal limits  CBG MONITORING, ED - Abnormal; Notable for the following components:   Glucose-Capillary 379 (*)    All other components within normal limits  HCG, SERUM, QUALITATIVE                                                                                                                         EKG  EKG Interpretation Date/Time:    Ventricular Rate:    PR Interval:    QRS Duration:    QT Interval:    QTC Calculation:   R Axis:      Text Interpretation:         Radiology No results found.  Medications Ordered in ED Medications  ketorolac (TORADOL) injection 30 mg (30 mg Intramuscular Given 08/03/23 2330)  lidocaine-EPINEPHrine (XYLOCAINE W/EPI) 2 %-1:200000 (PF) injection 20 mL (20 mLs Infiltration Given 08/03/23 2332)  sulfamethoxazole-trimethoprim (BACTRIM DS) 800-160 MG per tablet 1 tablet (1 tablet Oral Given 08/03/23 2332)  insulin aspart (novoLOG) injection 8 Units (8 Units Subcutaneous Given 08/04/23 0025)   Procedures .Marland KitchenIncision and Drainage  Date/Time: 08/04/2023 1:13 AM  Performed by: Nira Conn, MD Authorized by: Nira Conn, MD   Consent:    Consent obtained:  Verbal   Consent given by:  Patient   Risks discussed:  Bleeding, incomplete drainage and infection   Alternatives discussed:  No treatment and referral Universal protocol:    Patient identity confirmed:  Arm band and verbally with patient Location:    Type:  Abscess   Size:  5x6 cm   Location:  Lower extremity   Lower extremity location:  Leg   Leg location:  R upper leg Pre-procedure details:    Skin preparation:  Antiseptic wash and povidone-iodine Anesthesia:    Anesthesia  method:  Local infiltration   Local anesthetic:  Lidocaine 2% WITH epi Procedure type:    Complexity:  Complex Procedure details:    Incision types:  Cruciate   Incision depth:  Subcutaneous   Wound management:  Probed and deloculated and extensive cleaning   Drainage:  Purulent and bloody   Drainage amount:  Moderate   Packing material: PenRose drain placed. Post-procedure details:    Procedure completion:  Tolerated   (including critical care time) Medical Decision Making / ED Course   Medical Decision Making Amount and/or Complexity of Data Reviewed Labs: ordered. Decision-making details documented in ED Course.  Risk Prescription drug management.    Patient presents with abscess to the right inner thigh with overlying cellulitis. I&D as above  Given the fact that the patient has not had any insulin for the past several months, labs were obtained.  She does have hyperglycemia without evidence of DKA. Given a dose of subcu insulin. Will discuss with pharmacy to find other option for patient's Lantus and lispro.  Final Clinical Impression(s) / ED Diagnoses Final diagnoses:  Abscess  Cellulitis of right lower extremity  Hyperglycemia   The patient appears reasonably screened and/or stabilized for discharge and I doubt any other medical condition or other [facility] requiring further screening, evaluation, or treatment in the ED at this time. I have discussed the findings, Dx and Tx plan with the patient/family who expressed understanding and agree(s) with the plan. Discharge instructions discussed at length. The patient/family was given strict return precautions who verbalized understanding of the instructions. No further questions at time of discharge.  Disposition: Discharge  Condition: Good  ED Discharge Orders          Ordered    insulin glargine (LANTUS) 100 UNIT/ML injection  Daily at bedtime        08/04/23 0057    insulin lispro (HUMALOG KWIKPEN) 100 UNIT/ML  KwikPen  3 times daily        08/04/23 0057    sulfamethoxazole-trimethoprim (BACTRIM DS) 800-160 MG tablet  2 times daily        08/04/23 0058            Follow Up: Marcine Matar, MD [address] [place] [address] [place] [address] [phone]  Go on 08/09/2023 as scheduled  [facility] Emergency Department at [facility] [address] [place] [address] [phone] Go to  in 3-5 days for wound check and drain removal (en 3-5 das para revisar la herida y retirar el drenaje)    This chart was dictated using voice recognition software.  Despite best efforts to proofread,  errors can occur which can change the documentation meaning.    Nira Conn, MD 08/04/23 [phone]

## 2023-08-04 LAB — CBG MONITORING, ED: Glucose-Capillary: 379 mg/dL — ABNORMAL HIGH (ref 70–99)

## 2023-08-04 MED ORDER — INSULIN GLARGINE 100 UNIT/ML ~~LOC~~ SOLN: 55.0000 [IU] | Freq: Every day | SUBCUTANEOUS | 11 refills | Status: DC

## 2023-08-04 MED ORDER — SULFAMETHOXAZOLE-TRIMETHOPRIM 800-160 MG PO TABS: 1.0000 | ORAL_TABLET | Freq: Two times a day (BID) | ORAL | 0 refills | Status: AC

## 2023-08-04 MED ORDER — INSULIN LISPRO (1 UNIT DIAL) 100 UNIT/ML (KWIKPEN): 6.0000 [IU] | PEN_INJECTOR | Freq: Three times a day (TID) | SUBCUTANEOUS | 0 refills | Status: DC

## 2023-08-07 ENCOUNTER — Encounter (HOSPITAL_COMMUNITY): Payer: Self-pay | Admitting: Emergency Medicine

## 2023-08-07 ENCOUNTER — Emergency Department (HOSPITAL_COMMUNITY)
Admission: EM | Admit: 2023-08-07 | Discharge: 2023-08-07 | Disposition: A | Discharge status: Home / Self Care | Payer: Commercial Managed Care - PPO | Attending: Emergency Medicine | Admitting: Emergency Medicine

## 2023-08-07 ENCOUNTER — Other Ambulatory Visit: Payer: Self-pay

## 2023-08-07 DIAGNOSIS — Z5189 Encounter for other specified aftercare: Secondary | ICD-10-CM

## 2023-08-07 DIAGNOSIS — Z7984 Long term (current) use of oral hypoglycemic drugs: Secondary | ICD-10-CM | POA: Insufficient documentation

## 2023-08-07 DIAGNOSIS — E1165 Type 2 diabetes mellitus with hyperglycemia: Secondary | ICD-10-CM | POA: Insufficient documentation

## 2023-08-07 DIAGNOSIS — Z794 Long term (current) use of insulin: Secondary | ICD-10-CM | POA: Diagnosis not present

## 2023-08-07 DIAGNOSIS — Z4800 Encounter for change or removal of nonsurgical wound dressing: Secondary | ICD-10-CM | POA: Insufficient documentation

## 2023-08-07 LAB — CBG MONITORING, ED: Glucose-Capillary: 335 mg/dL — ABNORMAL HIGH (ref 70–99)

## 2023-08-07 NOTE — ED Provider Triage Note (Signed)
Emergency Medicine Provider Triage Evaluation Note  Brenda Greer , a 37 y.o. female  was evaluated in triage.  Pt complains of wound check. Seen on 08/03/23 for abscess with overlying cellulitis, treated with I&d and drain placement. Patient reports improvement in redness and pain.   Review of Systems  Positive: Thigh abscess Negative: Fever, chills  Physical Exam  BP 114/77 (BP Location: Right Arm)   Pulse 92   Temp 98.4 F (36.9 C) (Oral)   Resp 16   Ht 5\' 4"  (1.626 m)   Wt 72.6 kg   SpO2 98%   BMI 27.46 kg/m  Gen:   Awake, no distress   Resp:  Normal effort  MSK:   Moves extremities without difficulty  Other:  Right inner thigh abscess  Medical Decision Making  Medically screening exam initiated at 6:57 PM.  Appropriate orders placed.  Brenda Greer was informed that the remainder of the evaluation will be completed by another provider, this initial triage assessment does not replace that evaluation, and the importance of remaining in the ED until their evaluation is complete.     Felicie Morn, NP 08/07/23 1900

## 2023-08-07 NOTE — ED Triage Notes (Signed)
Patient arrives ambulatory by POV- said to return here for wound recheck and drain removal to right upper thigh abscess.

## 2023-08-07 NOTE — ED Provider Notes (Signed)
[place] EMERGENCY DEPARTMENT AT [facility] Provider Note   CSN: [id number] Arrival date & time: 08/07/23  1617     History {Add pertinent medical, surgical, social history, OB history to HPI:1} Chief Complaint  Patient presents with   Wound Check    Brenda Greer is a 37 y.o. female with past medical history of type 2 diabetes, I&D and cellulitis of right upper leg presents for wound recheck.  She was seen on 08/03/2023 at [facility] ED for abscess in right upper thigh.  I&D was performed and drain was placed.  She reports she has been taking her antibiotics as prescribed starting on Sunday and wound has been adequately draining.  She has been changing her dressing and notes improvement to the area.  She has some mild pain to right upper thigh. She denies worsening of rash, fever.   Wound Check Pertinent negatives include no chest pain, no abdominal pain, no headaches and no shortness of breath.       Home Medications Prior to Admission medications   Medication Sig Start Date End Date Taking? Authorizing Provider  Blood Glucose Monitoring Suppl (ONETOUCH VERIO) w/Device KIT Use as instructed to check blood sugar three times daily. E11.42 11/18/21   Marcine Matar, MD  Continuous Blood Gluc Receiver (DEXCOM G7 RECEIVER) DEVI UAD 10/19/22   Marcine Matar, MD  Continuous Blood Gluc Sensor (DEXCOM G7 SENSOR) MISC Change device Q 10 days/ 10/19/22   Marcine Matar, MD  ferrous sulfate 325 (65 FE) MG tablet Take 1 tablet (325 mg total) by mouth daily with breakfast. 07/03/22   Marcine Matar, MD  fluconazole (DIFLUCAN) 150 MG tablet Take 1 tablet (150 mg total) by mouth daily. 10/19/22   Marcine Matar, MD  glucose blood (ONETOUCH VERIO) test strip Use as instructed to check blood sugar three times daily. E11.42 07/17/22   Marcine Matar, MD  insulin glargine (LANTUS) 100 UNIT/ML injection Inject 0.55 mLs (55 Units total) into the skin at bedtime. 08/04/23    CardamaAmadeo Garnet, MD  insulin glargine, 1 Unit Dial, (TOUJEO SOLOSTAR) 300 UNIT/ML Solostar Pen Inject 55 Units into the skin daily. 01/30/23   Marcine Matar, MD  insulin lispro (HUMALOG KWIKPEN) 100 UNIT/ML KwikPen Inject 6 Units into the skin 3 (three) times daily. 08/04/23   Cardama, Amadeo Garnet, MD  Insulin Pen Needle (BD PEN NEEDLE NANO U/F) 32G X 4 MM MISC Use to inject insulin in the morning and bedtime 05/15/22   Sheikh, Omair Latif, DO  metFORMIN (GLUCOPHAGE-XR) 500 MG 24 hr tablet TAKE 2 TABLETS BY MOUTH TWICE DAILY WITH MEALS 07/30/23   Marcine Matar, MD  nystatin-triamcinolone (MYCOLOG II) cream Apply 1 Application topically 2 (two) times daily. 10/19/22   Marcine Matar, MD  OneTouch Delica Lancets 33G MISC Use as instructed to check blood sugar three times daily. E11.42 07/04/22   Marcine Matar, MD  pravastatin (PRAVACHOL) 20 MG tablet TAKE 1 TABLET BY MOUTH AT BEDTIME 07/30/23   Marcine Matar, MD  sulfamethoxazole-trimethoprim (BACTRIM DS) 800-160 MG tablet Take 1 tablet by mouth 2 (two) times daily for 7 days. 08/04/23 08/11/23  Nira Conn, MD      Allergies    Patient has no known allergies.    Review of Systems   Review of Systems  Constitutional:  Negative for chills, fatigue and fever.  Respiratory:  Negative for cough, chest tightness, shortness of breath and wheezing.   Cardiovascular:  Negative for chest pain and palpitations.  Gastrointestinal:  Negative for abdominal pain, constipation, diarrhea, nausea and vomiting.  Skin:  Positive for rash and wound.  Neurological:  Negative for dizziness, seizures, weakness, light-headedness, numbness and headaches.    Physical Exam Updated Vital Signs BP 126/81   Pulse 90   Temp 98.4 F (36.9 C) (Oral)   Resp 16   Ht 5\' 4"  (1.626 m)   Wt 72.6 kg   SpO2 100%   BMI 27.46 kg/m  Physical Exam Vitals and nursing note reviewed.  Constitutional:      General: She is not in acute  distress.    Appearance: Normal appearance.  HENT:     Head: Normocephalic and atraumatic.  Eyes:     Conjunctiva/sclera: Conjunctivae normal.  Cardiovascular:     Rate and Rhythm: Normal rate.  Pulmonary:     Effort: Pulmonary effort is normal. No respiratory distress.  Musculoskeletal:     Cervical back: Neck supple. No rigidity or tenderness.  Skin:    Coloration: Skin is not jaundiced or pale.  Neurological:     Mental Status: She is alert. Mental status is at baseline.     ED Results / Procedures / Treatments   Labs (all labs ordered are listed, but only abnormal results are displayed) Labs Reviewed - No data to display  EKG None  Radiology No results found.  Procedures Procedures  {Document cardiac monitor, telemetry assessment procedure when appropriate:1}  Medications Ordered in ED Medications - No data to display  ED Course/ Medical Decision Making/ A&P   {   Click here for ABCD2, HEART and other calculatorsREFRESH Note before signing :1}                              Medical Decision Making    Patient presents to the ED for concern of ***, this involves an extensive number of treatment options, and is a complaint that carries with it a high risk of complications and morbidity.  The differential diagnosis includes ***   Co morbidities that complicate the patient evaluation  ***   Additional history obtained:  Additional history obtained from *** {Blank multiple:19196::"EMS","Family","Nursing","Outside Medical Records","Past Admission"}   External records from outside source obtained and reviewed including ***   Lab Tests:  I Ordered, and personally interpreted labs.  The pertinent results include: CBG of 335.  Upon further questioning, patient reports that she is post to take her insulin 3 times a day and missed her second and third dose like she normally would due to being in emergency department.  She has no complaints of headache, visual  disturbances, paresthesia, nausea, vomiting, urinary symptoms, fatigue, muscle stiffness.   Problem List / ED Course:  Wound recheck   Reevaluation:  After the interventions noted above, I reevaluated the patient and found that they have :stayed the same    Dispostion:  Patient is resting comfortably in chair.  Vital signs WNL.  Patient reports that wound has improved significantly since being evaluated on 10/18.  She reports that it has been draining and she has been taking her antibiotics as prescribed since Sunday.  As wound looks much improved, remove drain as recommended by Dr. Montez Morita, on 08/03/2023.  After consideration of the diagnostic results and the patients response to treatment, I feel that the patent would benefit from continued outpatient management with antibiotics and follow-up with general surgery for further management.  CBG was  335 however she has missed 2 of her insulin doses and has no complaints.  I recommended the patient take her insulin when she gets home from ED.   {Document critical care time when appropriate:1} {Document review of labs and clinical decision tools ie heart score, Chads2Vasc2 etc:1}  {Document your independent review of radiology images, and any outside records:1} {Document your discussion with family members, caretakers, and with consultants:1} {Document social determinants of health affecting pt's care:1} {Document your decision making why or why not admission, treatments were needed:1} Final Clinical Impression(s) / ED Diagnoses Final diagnoses:  None    Rx / DC Orders ED Discharge Orders     None

## 2023-08-07 NOTE — Discharge Instructions (Addendum)
You for letting us evaluate you today.  Your wound looks like it is improving.  Please complete entire antibiotic course and follow-up with your primary care provider for wound recheck.  Please return to emergency department if you experience worsening of rash, spreading  I have provided you general surgery's number Dr. Manus Rudd to follow-up and schedule an appointment following antibiotics to make sure that wound is healing appropriately and do not need drainage.  If you are unable to make an appointment with him following antibiotics, you can follow-up with your primary care provider and they can follow your wound.  Please make sure to take your insulin tonight and take it as prescribed.  You may follow-up with your primary care doctor regarding insulin management and diabetic management

## 2023-08-09 ENCOUNTER — Encounter: Payer: Self-pay | Admitting: Internal Medicine

## 2023-08-09 ENCOUNTER — Ambulatory Visit: Payer: Commercial Managed Care - PPO | Attending: Internal Medicine | Admitting: Internal Medicine

## 2023-08-09 ENCOUNTER — Other Ambulatory Visit: Payer: Self-pay

## 2023-08-09 VITALS — BP 124/70 | HR 98 | Temp 98.2°F | Ht 64.0 in | Wt 148.0 lb

## 2023-08-09 DIAGNOSIS — E785 Hyperlipidemia, unspecified: Secondary | ICD-10-CM

## 2023-08-09 DIAGNOSIS — L02415 Cutaneous abscess of right lower limb: Secondary | ICD-10-CM

## 2023-08-09 DIAGNOSIS — E119 Type 2 diabetes mellitus without complications: Secondary | ICD-10-CM | POA: Diagnosis not present

## 2023-08-09 DIAGNOSIS — E1165 Type 2 diabetes mellitus with hyperglycemia: Secondary | ICD-10-CM

## 2023-08-09 DIAGNOSIS — Z23 Encounter for immunization: Secondary | ICD-10-CM

## 2023-08-09 DIAGNOSIS — R7989 Other specified abnormal findings of blood chemistry: Secondary | ICD-10-CM

## 2023-08-09 DIAGNOSIS — Z794 Long term (current) use of insulin: Secondary | ICD-10-CM | POA: Diagnosis not present

## 2023-08-09 DIAGNOSIS — Z7984 Long term (current) use of oral hypoglycemic drugs: Secondary | ICD-10-CM

## 2023-08-09 DIAGNOSIS — E1142 Type 2 diabetes mellitus with diabetic polyneuropathy: Secondary | ICD-10-CM

## 2023-08-09 DIAGNOSIS — E1169 Type 2 diabetes mellitus with other specified complication: Secondary | ICD-10-CM | POA: Diagnosis not present

## 2023-08-09 LAB — POCT URINALYSIS DIP (CLINITEK)
Bilirubin, UA: NEGATIVE
Blood, UA: NEGATIVE
Glucose, UA: >=1000 mg/dL — AB
Ketones, POC UA: NEGATIVE mg/dL
Leukocytes, UA: NEGATIVE
Nitrite, UA: NEGATIVE
POC PROTEIN,UA: NEGATIVE
Spec Grav, UA: 1.015 (ref 1.010–1.025)
Urobilinogen, UA: 0.2 U/dL
pH, UA: 6 (ref 5.0–8.0)

## 2023-08-09 LAB — GLUCOSE, POCT (MANUAL RESULT ENTRY): POC Glucose: 371 mg/dL — AB (ref 70–99)

## 2023-08-09 LAB — POCT GLYCOSYLATED HEMOGLOBIN (HGB A1C): HbA1c, POC (controlled diabetic range): 14.3 % — AB (ref 0.0–7.0)

## 2023-08-09 MED ORDER — METFORMIN HCL ER 500 MG PO TB24: 1000.0000 mg | ORAL_TABLET | Freq: Two times a day (BID) | ORAL | 2 refills | Status: DC

## 2023-08-09 MED ORDER — INSULIN LISPRO (1 UNIT DIAL) 100 UNIT/ML (KWIKPEN): 5.0000 [IU] | PEN_INJECTOR | Freq: Three times a day (TID) | SUBCUTANEOUS | 3 refills | Status: DC

## 2023-08-09 MED ORDER — INSULIN LISPRO (1 UNIT DIAL) 100 UNIT/ML (KWIKPEN): 5.0000 [IU] | PEN_INJECTOR | Freq: Three times a day (TID) | SUBCUTANEOUS | 0 refills | Status: DC

## 2023-08-09 MED ORDER — SULFAMETHOXAZOLE-TRIMETHOPRIM 800-160 MG PO TABS
1.0000 | ORAL_TABLET | Freq: Two times a day (BID) | ORAL | 0 refills | Status: DC
Start: 2023-08-09 — End: 2023-11-09
  Filled 2023-08-09: qty 10, 5d supply, fill #0

## 2023-08-09 MED ORDER — PRAVASTATIN SODIUM 10 MG PO TABS: 10.0000 mg | ORAL_TABLET | Freq: Every day | ORAL | 2 refills | Status: DC

## 2023-08-09 MED ORDER — ONETOUCH VERIO VI STRP: ORAL_STRIP | 6 refills | Status: AC

## 2023-08-09 MED ORDER — INSULIN ASPART 100 UNIT/ML IJ SOLN
3.0000 [IU] | Freq: Once | INTRAMUSCULAR | Status: AC
Start: 2023-08-09 — End: 2023-08-09
  Administered 2023-08-09: 3 [IU] via SUBCUTANEOUS

## 2023-08-09 MED ORDER — TOUJEO SOLOSTAR 300 UNIT/ML ~~LOC~~ SOPN: 25.0000 [IU] | PEN_INJECTOR | Freq: Every day | SUBCUTANEOUS | 3 refills | Status: DC

## 2023-08-09 NOTE — Patient Instructions (Signed)
I have sent a referral to the ophthalmologist for you to have eye exam done.  I have submitted a referral to the general surgeon for the abscess on your right thigh. I have sent a prescription for 5 more days of the antibiotics.  Continue the mealtime insulin but increase to 5 units with meals.

## 2023-08-09 NOTE — Progress Notes (Signed)
Patient ID: Brenda Greer, female    DOB: [birth year]  MRN: [id number]  CC: Diabetes (DM f/u. /No questions / concerns /Instructed to go to our pharm for flu vax)   Subjective: Brenda Greer is a 37 y.o. female who presents for chronic ds management. Her concerns today include:  Pt has DM, HL, GERD,  Iron def anemia, migraines without aura.   [employer] Language interpreter used during this encounter. #Brenda Greer [id number] Canes [phone]  Patient was seen in the emergency room on 08/03/2023 with abscess to the right thigh.  This was incised and drained and a drain was left in place.  Patient given Bactrim to take 2x/day for 7 days.  She returned to the ER on 08/07/2023 for wound check.  The drain was removed.  Wound appeared to be improving.  Patient was told to continue antibiotics Bactrim BS BID.  Blood draw revealed mild elevation in WBC with elevated neutrophils.  AST and ALT were mildly elevated at 56 and 64 compared to being normal in July 2023. -today pt reports she is taking the Bactrim as prescribed and area getting better with decreasing erythema.  Still has a lot of drainage and does dressing changes.  Told by ER to f/u with general surgeon Dr Lynann Bologna    DM: Results for orders placed or performed in visit on 08/09/23  POCT glycosylated hemoglobin (Hb A1C)  Result Value Ref Range   Hemoglobin A1C     HbA1c POC (<> result, manual entry)     HbA1c, POC (prediabetic range)     HbA1c, POC (controlled diabetic range) 14.3 (A) 0.0 - 7.0 %  POCT glucose (manual entry)  Result Value Ref Range   POC Glucose 371 (A) 70 - 99 mg/dl  POCT URINALYSIS DIP (CLINITEK)  Result Value Ref Range   Color, UA yellow yellow   Clarity, UA clear clear   Glucose, UA >=1,000 (A) negative mg/dL   Bilirubin, UA negative negative   Ketones, POC UA negative negative mg/dL   Spec Grav, UA 9.562 1.308 - 1.025   Blood, UA negative negative   pH, UA 6.0 5.0 - 8.0   POC PROTEIN,UA negative negative,  trace   Urobilinogen, UA 0.2 0.2 or 1.0 E.U./dL   Nitrite, UA Negative Negative   Leukocytes, UA Negative Negative   On last visit, patient was left on Tresiba 55 units daily,aspart insulin 6 units 3 times a day with meals and  Metformin XR 1 gram BID.   A1C elev today.  Out of insulin x 2 mths due to cost.  Has [facility] insurance but reports even with insurance it was costly around $700. ER doctor sent rxns to her pharmacy for Glargine and Humalog.  She was only able to afford the Humalog which was $23.  Has been inj 3 units TID.  Out Metformin and Pravastatin also for 2 mths -endorses polyuria and polydipsia.  No blurred vision.  Did not get eye exam done; missed appt with eye doctor.  Endorses wgh loss since being out of insulin - loss 12 lbs since.  Endorses numbness in feet Not checking BS.  Still has her glucometer and testing supplies. Last ate about 4 hrs ago.  BS now is 371.    Patient Active Problem List   Diagnosis Date Noted   Type 2 diabetes mellitus with hyperglycemia (HCC) 05/14/2022   Abdominal wall cellulitis 05/13/2022   History of herpes genitalis 11/18/2021   Microalbuminuria due to type 2 diabetes mellitus (HCC) 01/22/2021  COVID-19 vaccine series completed 09/21/2020   Uncontrolled type 2 diabetes mellitus with peripheral neuropathy 07/18/2019   Migraine without aura and without status migrainosus, not intractable 07/18/2019   Hyperlipidemia associated with type 2 diabetes mellitus (HCC) 07/18/2019   Iron deficiency anemia due to chronic blood loss 07/02/2018   Hyperlipidemia 02/26/2018   Gastroesophageal reflux disease without esophagitis 02/26/2018   Diabetes mellitus without complication (HCC) 04/09/2017   Type II or unspecified type diabetes mellitus without mention of complication, uncontrolled 05/19/2013   OBESITY, NOS 12/13/2006     Current Outpatient Medications on File Prior to Visit  Medication Sig Dispense Refill   Blood Glucose Monitoring Suppl (ONETOUCH  VERIO) w/Device KIT Use as instructed to check blood sugar three times daily. E11.42 (Patient not taking: Reported on 08/09/2023) 1 kit 0   ferrous sulfate 325 (65 FE) MG tablet Take 1 tablet (325 mg total) by mouth daily with breakfast. (Patient not taking: Reported on 08/09/2023) 100 tablet 0   Insulin Pen Needle (BD PEN NEEDLE NANO U/F) 32G X 4 MM MISC Use to inject insulin in the morning and bedtime (Patient not taking: Reported on 08/09/2023) 100 each 0   OneTouch Delica Lancets 33G MISC Use as instructed to check blood sugar three times daily. E11.42 (Patient not taking: Reported on 08/09/2023) 100 each 2   sulfamethoxazole-trimethoprim (BACTRIM DS) 800-160 MG tablet Take 1 tablet by mouth 2 (two) times daily for 7 days. (Patient not taking: Reported on 08/09/2023) 14 tablet 0   No current facility-administered medications on file prior to visit.    No Known Allergies  Social History   Socioeconomic History   Marital status: Married    Spouse name: Not on file   Number of children: Not on file   Years of education: Not on file   Highest education level: Not on file  Occupational History   Not on file  Tobacco Use   Smoking status: Never   Smokeless tobacco: Never  Substance and Sexual Activity   Alcohol use: No   Drug use: No   Sexual activity: Not on file  Other Topics Concern   Not on file  Social History Narrative   Not on file   Social Determinants of Health   Financial Resource Strain: Not on file  Food Insecurity: Not on file  Transportation Needs: Not on file  Physical Activity: Not on file  Stress: Not on file  Social Connections: Not on file  Intimate Partner Violence: Not on file    Family History  Problem Relation Age of Onset   Diabetes Mother    Hypertension Mother     No past surgical history on file.  ROS: Review of Systems Negative except as stated above  PHYSICAL EXAM: BP 124/70   Pulse 98   Temp 98.2 F (36.8 C) (Oral)   Ht 5\' 4"   (1.626 m)   Wt 148 lb (67.1 kg)   SpO2 100%   BMI 25.40 kg/m   Wt Readings from Last 3 Encounters:  08/09/23 148 lb (67.1 kg)  08/07/23 160 lb (72.6 kg)  04/21/23 160 lb (72.6 kg)    Physical Exam  General appearance - alert, well appearing, and in no distress Mental status - normal mood, behavior, speech, dress, motor activity, and thought processes Chest - clear to auscultation, no wheezes, rales or rhonchi, symmetric air entry Heart - normal rate, regular rhythm, normal S1, S2, no murmurs, rubs, clicks or gallops Extremities - peripheral pulses normal, no pedal edema,  no clubbing or cyanosis Diabetic Foot Exam - Simple   Simple Foot Form Diabetic Foot exam was performed with the following findings: Yes 08/09/2023  5:12 PM  Visual Inspection See comments: Yes Sensation Testing Intact to touch and monofilament testing bilaterally: Yes Pulse Check Posterior Tibialis and Dorsalis pulse intact bilaterally: Yes Comments Toenails are thick, discolored and chipped.    Skin -right thigh: Area was I&D's in the upper inner thigh.  Gauze was soaked with yellowish drainage.  Size of the incision is about 3 cm.  Exudative based with surrounding induration.  See picture below.      Latest Ref Rng & Units 08/03/2023    8:14 PM 05/15/2022    4:54 AM 05/14/2022    5:27 AM  CMP  Glucose 70 - 99 mg/dL 846  962  952   BUN 6 - 20 mg/dL 8  <5  <5   Creatinine 0.44 - 1.00 mg/dL 8.41  3.24  <4.01   Sodium 135 - 145 mmol/L 129  137  137   Potassium 3.5 - 5.1 mmol/L 4.1  3.6  3.4   Chloride 98 - 111 mmol/L 99  108  108   CO2 22 - 32 mmol/L 19  23  21    Calcium 8.9 - 10.3 mg/dL 9.3  8.6  7.6   Total Protein 6.5 - 8.1 g/dL 7.9  5.7  5.9   Total Bilirubin 0.3 - 1.2 mg/dL 1.0  0.1  0.7   Alkaline Phos 38 - 126 U/L 66  52  55   AST 15 - 41 U/L 56  25  66   ALT 0 - 44 U/L 64  21  27    Lipid Panel     Component Value Date/Time   CHOL 222 (H) 11/18/2021 1037   TRIG 116 11/18/2021 1037   HDL  66 11/18/2021 1037   CHOLHDL 3.4 11/18/2021 1037   CHOLHDL 4.2 05/19/2013 1703   VLDL 38 05/19/2013 1703   LDLCALC 136 (H) 11/18/2021 1037    CBC    Component Value Date/Time   WBC 12.8 (H) 08/03/2023 2014   RBC 4.42 08/03/2023 2014   HGB 12.9 08/03/2023 2014   HGB 12.7 10/19/2022 1632   HCT 38.8 08/03/2023 2014   HCT 39.8 10/19/2022 1632   PLT 175 08/03/2023 2014   PLT 163 10/19/2022 1632   MCV 87.8 08/03/2023 2014   MCV 85 10/19/2022 1632   MCH 29.2 08/03/2023 2014   MCHC 33.2 08/03/2023 2014   RDW 13.7 08/03/2023 2014   RDW 13.8 10/19/2022 1632   LYMPHSABS 1.3 08/03/2023 2014   MONOABS 0.8 08/03/2023 2014   EOSABS 0.1 08/03/2023 2014   BASOSABS 0.0 08/03/2023 2014    ASSESSMENT AND PLAN: 1. Type 2 diabetes mellitus with hyperglycemia, with long-term current use of insulin (HCC) Uncontrolled due to being out of medications for 2 months. I was able to speak with our clinical pharmacist.  He was able to get her a co-pay card for Toujeo.  Given that she has been off of her insulin for over 2 months, we will restart her at 25 units of the Toujeo and 5 units of Humalog with meals. Given 3 units of NovoLog today here in the office. Advised to check blood sugars at least twice a day before meals and bring readings with her to see the clinical pharmacist in 1 month. Check BMP today.  Hold off on restarting metformin until after being back on her insulin for 2  days. - POCT glycosylated hemoglobin (Hb A1C) - POCT glucose (manual entry) - Ambulatory referral to Ophthalmology - insulin aspart (novoLOG) injection 3 Units - insulin glargine, 1 Unit Dial, (TOUJEO SOLOSTAR) 300 UNIT/ML Solostar Pen; Inject 25 Units into the skin daily.  Dispense: 4.5 mL; Refill: 3 - insulin lispro (HUMALOG KWIKPEN) 100 UNIT/ML KwikPen; Inject 5 Units into the skin 3 (three) times daily.  Dispense: 15 mL; Refill: 0 - glucose blood (ONETOUCH VERIO) test strip; Use as instructed to check blood sugar three  times daily. E11.42  Dispense: 100 each; Refill: 6 - metFORMIN (GLUCOPHAGE-XR) 500 MG 24 hr tablet; Take 2 tablets (1,000 mg total) by mouth 2 (two) times daily with a meal.  Dispense: 180 tablet; Refill: 2  2. Diabetes mellitus treated with oral medication (HCC) See #1 above - POCT URINALYSIS DIP (CLINITEK) - Basic Metabolic Panel  3. Diabetic peripheral neuropathy (HCC)   4. Hyperlipidemia associated with type 2 diabetes mellitus (HCC) Given the mild elevation in her LFTs, which is likely due to fatty liver, I will decrease the dose of the pravastatin to 10 mg daily and recheck LFTs on subsequent visit - pravastatin (PRAVACHOL) 10 MG tablet; Take 1 tablet (10 mg total) by mouth at bedtime.  Dispense: 30 tablet; Refill: 2  5. Abscess of right thigh Advised that we will add an additional 5 days of antibiotics.  Continue daily dressing changes.  We changed the dressing here for her today. - Ambulatory referral to General Surgery - sulfamethoxazole-trimethoprim (BACTRIM DS) 800-160 MG tablet; Take 1 tablet by mouth 2 (two) times daily.  Dispense: 10 tablet; Refill: 0  6. Abnormal LFTs - Hepatitis C Antibody - Hepatitis B surface antigen - Hepatitis B surface antibody,quantitative  7. Encounter for immunization - Flu vaccine trivalent PF, 6mos and older(Flulaval,Afluria,Fluarix,Fluzone)    Patient was given the opportunity to ask questions.  Patient verbalized understanding of the plan and was able to repeat key elements of the plan.   This documentation was completed using Paediatric nurse.  Any transcriptional errors are unintentional.  Orders Placed This Encounter  Procedures   Flu vaccine trivalent PF, 6mos and older(Flulaval,Afluria,Fluarix,Fluzone)   Hepatitis C Antibody   Hepatitis B surface antigen   Hepatitis B surface antibody,quantitative   Basic Metabolic Panel   Ambulatory referral to Ophthalmology   Ambulatory referral to General Surgery   POCT  glycosylated hemoglobin (Hb A1C)   POCT glucose (manual entry)   POCT URINALYSIS DIP (CLINITEK)     Requested Prescriptions   Signed Prescriptions Disp Refills   sulfamethoxazole-trimethoprim (BACTRIM DS) 800-160 MG tablet 10 tablet 0    Sig: Take 1 tablet by mouth 2 (two) times daily.   insulin glargine, 1 Unit Dial, (TOUJEO SOLOSTAR) 300 UNIT/ML Solostar Pen 4.5 mL 3    Sig: Inject 25 Units into the skin daily.   insulin lispro (HUMALOG KWIKPEN) 100 UNIT/ML KwikPen 15 mL 0    Sig: Inject 5 Units into the skin 3 (three) times daily.   glucose blood (ONETOUCH VERIO) test strip 100 each 6    Sig: Use as instructed to check blood sugar three times daily. E11.42   metFORMIN (GLUCOPHAGE-XR) 500 MG 24 hr tablet 180 tablet 2    Sig: Take 2 tablets (1,000 mg total) by mouth 2 (two) times daily with a meal.   pravastatin (PRAVACHOL) 10 MG tablet 30 tablet 2    Sig: Take 1 tablet (10 mg total) by mouth at bedtime.    Return in  3 months (on 11/09/2023) for Appt with [facility] in 3 wks for BS check.  Jonah Blue, MD, FACP

## 2023-08-10 LAB — BASIC METABOLIC PANEL
BUN/Creatinine Ratio: 19 (ref 9–23)
BUN: 10 mg/dL (ref 6–20)
CO2: 20 mmol/L (ref 20–29)
Calcium: 9.2 mg/dL (ref 8.7–10.2)
Chloride: 101 mmol/L (ref 96–106)
Creatinine, Ser: 0.54 mg/dL — ABNORMAL LOW (ref 0.57–1.00)
Glucose: 377 mg/dL — ABNORMAL HIGH (ref 70–99)
Potassium: 4 mmol/L (ref 3.5–5.2)
Sodium: 134 mmol/L (ref 134–144)
eGFR: 121 mL/min/{1.73_m2} (ref >59)

## 2023-08-10 LAB — HEPATITIS B SURFACE ANTIBODY, QUANTITATIVE: Hepatitis B Surf Ab Quant: <3.5 m[IU]/mL — ABNORMAL LOW

## 2023-08-10 LAB — HEPATITIS B SURFACE ANTIGEN: Hepatitis B Surface Ag: NEGATIVE

## 2023-08-10 LAB — HEPATITIS C ANTIBODY: Hep C Virus Ab: NONREACTIVE

## 2023-08-14 ENCOUNTER — Telehealth: Payer: Self-pay | Admitting: Internal Medicine

## 2023-08-14 ENCOUNTER — Other Ambulatory Visit (HOSPITAL_BASED_OUTPATIENT_CLINIC_OR_DEPARTMENT_OTHER): Payer: Self-pay

## 2023-08-14 MED ORDER — HUMULIN N KWIKPEN 100 UNIT/ML ~~LOC~~ SUPN: 12.0000 [IU] | PEN_INJECTOR | Freq: Two times a day (BID) | SUBCUTANEOUS | 11 refills | Status: DC

## 2023-08-14 NOTE — Telephone Encounter (Signed)
Please let patient know that send she could not afford the insulin that we prescribed, I have sent a new prescription for an insulin called Humulin N.  She should take 12 units twice a day with breakfast and dinner.

## 2023-08-14 NOTE — Addendum Note (Signed)
Addended by: Jonah Blue B on: 08/14/2023 06:13 PM   Modules accepted: Orders

## 2023-08-14 NOTE — Telephone Encounter (Signed)
Copied from CRM [id number]. Topic: General - Other >> Aug 14, 2023 12:55 PM Dondra Prader E wrote: Reason for CRM: Pt called reporting that she cannot afford insulin glargine, 1 Unit Dial, (TOUJEO SOLOSTAR) 300 UNIT/ML Solostar Pen

## 2023-08-15 NOTE — Telephone Encounter (Signed)
Called but no answer. LVM to call back.  

## 2023-08-15 NOTE — Telephone Encounter (Signed)
Patient called back. Verified name & DOB. Informed that a new insulin prescription was sent to her pharmacy due to cost issues. Informed that it is called Humulin N and to take 12 units twice a day with breakfast & dinner. Patient expressed verbal understanding of all discussed. No further questions at this time.

## 2023-08-17 ENCOUNTER — Other Ambulatory Visit: Payer: Commercial Managed Care - PPO

## 2023-08-21 ENCOUNTER — Other Ambulatory Visit: Payer: Self-pay

## 2023-08-22 ENCOUNTER — Other Ambulatory Visit: Payer: Self-pay | Admitting: Internal Medicine

## 2023-08-22 DIAGNOSIS — Z794 Long term (current) use of insulin: Secondary | ICD-10-CM

## 2023-08-22 MED ORDER — INSULIN LISPRO (1 UNIT DIAL) 100 UNIT/ML (KWIKPEN): 5.0000 [IU] | PEN_INJECTOR | Freq: Three times a day (TID) | SUBCUTANEOUS | 3 refills | Status: DC

## 2023-08-29 NOTE — Progress Notes (Unsigned)
S:    PCP: Dr. Laural Benes  Interpreter: Amy # [phone] 37 y.o. female who presents for diabetes evaluation, education, and management. DM diagnosed during her second pregnancy, ~17 years ago. Has been on insulin since. PMH is significant for T2DM, HLD, chronic anemia, and migraines.   Patient was referred by Primary Care Provider, Dr. Laural Benes, and last seen by PCP on 08/09/2023. At this visit, patient endorsed polyuria, polydipsia, neuropathy, and 12lbs of weight loss over 2 months. Noted she was out of insulin for previous 2 months d/t cost. BG was 371 in office, so patient was given 3 units of Novolog. Patient reported she had only been able to afford Humalog. Reported only taking Humalog 3 units TID for her DM, although was prescribed Tresiba 55 units daily, Humalog 6 units TID, and metformin XR 1g BID at that time. Co-pay card for Toujeo appears to have not brought down copay enough, so patient was started on insulin NPH 12 units BID and Humalog 5 units TID. She was subsequently restarted on max dose metformin once renal function was checked.  Of note, patient last saw PharmD on 12/19/22, and her home BG appeared to be close to goal on Tresiba 55 units daily, Fiasp 6 units TID, and max dose metformin. Patient unable to afford Trulicity, Ozempic, and CGM copay at that visit due to her insurance at that time.  Today, patient arrives in good spirits. She is doing well today with no complaints. Reports adherence and no missed doses with 70/30 insulin, mealtime insulin, and metformin. No diarrhea or GI upset with metformin. Notes she has been giving her insulin lispro 15 minutes after she eats as that is what she was told to do. She states the cost of her medications are affordable.  Family/Social History:  DM- mother Tobacco: never smoker  Alcohol: none reported  Current diabetes medications include: Humulin N Kwikpen 12 units BID, insulin lispro Kwikpen 5 units TID (takes BID), metformin XR 1000 mg  BID  Insurance coverage: UHC   Patient denies hypoglycemic events.  Brings in written home BG log from 11/1-11/13 Reported home fasting blood sugars: 140, 129, 156, 147, 124, 135, 190, 252, 118, 181, 128, 129, 194 Reported 2 hour post-dinner/random blood sugars: 211, 145, 218, 193, 285, 184, 275, 285, 144, 266, 131, 166, 263  Patient denies nocturia (nighttime urination) - previously was having but has since resolved Patient reports neuropathy (nerve pain) - both fingers and toes, started this week Patient denies visual changes. Patient reports self foot exams.   Patient reported dietary habits: Eats 2 meals/day Lunch: rice, rice with chicken and vegetables Dinner: meat/steak with chili Drinks: mainly water, diet soda  Patient reported exercise: none  O:  Lab Results  Component Value Date   HGBA1C 14.3 (A) 08/09/2023   There were no vitals filed for this visit.  Lipid Panel     Component Value Date/Time   CHOL 222 (H) 11/18/2021 1037   TRIG 116 11/18/2021 1037   HDL 66 11/18/2021 1037   CHOLHDL 3.4 11/18/2021 1037   CHOLHDL 4.2 05/19/2013 1703   VLDL 38 05/19/2013 1703   LDLCALC 136 (H) 11/18/2021 1037   Clinical Atherosclerotic Cardiovascular Disease (ASCVD): No  The ASCVD Risk score (Arnett DK, et al., 2019) failed to calculate for the following reasons:   The 2019 ASCVD risk score is only valid for ages 2 to 69   A/P: Diabetes longstanding currently uncontrolled based on A1c of 14.3% (07/2023), which is up from  previous A1c of 9.2% (01/18/23). FBG are progressing in the right direction as they show improvement from last month's A1c. Most FBG noted to be between 118-156 with a few outliers of 190-252. Noted almost half of PPBGs are in the upper 200s, however, patient was taking mealtime insulin after she ate. Provided education to give 10-15 minutes before she eats. Patient voiced understanding and performed teach back. As FBG are near goal, will only have patient adjust  timing of mealtime insulin at this time. Will reassess PPBG at next visit. Patient is able to verbalize appropriate hypoglycemia management plan. Medication adherence appears appropriate. -Continue Humulin N 12 units BID -Continue insulin lispro 5 units TID, but switch from taking after meals to before meals -Continue metformin 1000 mg BID (takes 500 mg XR tablets) -Extensively discussed pathophysiology of diabetes, recommended lifestyle interventions, dietary effects on blood sugar control.  -Counseled on s/sx of and management of hypoglycemia.  -Next A1c anticipated 10/2023  Written patient instructions provided. Patient verbalized understanding of treatment plan.  Total time in face to face counseling 30 minutes.    Follow-up:  Pharmacist: 10/04/23 PCP clinic visit: 11/09/23  Nicole Kindred, PharmD PGY1 Pharmacy Resident 08/30/2023 3:32 PM

## 2023-08-30 ENCOUNTER — Ambulatory Visit: Payer: Commercial Managed Care - PPO | Attending: Internal Medicine | Admitting: Pharmacist

## 2023-08-30 ENCOUNTER — Ambulatory Visit: Payer: Commercial Managed Care - PPO

## 2023-08-30 ENCOUNTER — Encounter: Payer: Self-pay | Admitting: Pharmacist

## 2023-08-30 DIAGNOSIS — Z23 Encounter for immunization: Secondary | ICD-10-CM

## 2023-08-30 DIAGNOSIS — Z794 Long term (current) use of insulin: Secondary | ICD-10-CM | POA: Insufficient documentation

## 2023-08-30 DIAGNOSIS — E1165 Type 2 diabetes mellitus with hyperglycemia: Secondary | ICD-10-CM | POA: Diagnosis not present

## 2023-08-30 DIAGNOSIS — Z7984 Long term (current) use of oral hypoglycemic drugs: Secondary | ICD-10-CM | POA: Diagnosis not present

## 2023-08-30 DIAGNOSIS — E669 Obesity, unspecified: Secondary | ICD-10-CM | POA: Diagnosis not present

## 2023-08-30 NOTE — Progress Notes (Signed)
Hep B 1st vaccine administered in left deltoid per protocols.  Information sheet given. Patient denies and pain or discomfort at injection site. Tolerated injection well no reaction.

## 2023-10-04 ENCOUNTER — Encounter: Payer: Self-pay | Admitting: Pharmacist

## 2023-10-04 ENCOUNTER — Ambulatory Visit: Payer: Commercial Managed Care - PPO | Attending: Internal Medicine | Admitting: Pharmacist

## 2023-10-04 DIAGNOSIS — Z7984 Long term (current) use of oral hypoglycemic drugs: Secondary | ICD-10-CM

## 2023-10-04 DIAGNOSIS — E1165 Type 2 diabetes mellitus with hyperglycemia: Secondary | ICD-10-CM

## 2023-10-04 DIAGNOSIS — Z794 Long term (current) use of insulin: Secondary | ICD-10-CM | POA: Diagnosis not present

## 2023-10-04 NOTE — Progress Notes (Signed)
    S:    PCP: Dr. Laural Benes  Interpreter: Fausto Skillern  # [phone] 37 y.o. female who presents for diabetes evaluation, education, and management. DM diagnosed during her second pregnancy, ~17 years ago. Has been on insulin since. PMH is significant for T2DM, HLD, chronic anemia, and migraines.   Patient was referred by Primary Care Provider, Dr. Laural Benes, on 08/09/2023. Pharmacy saw her last month and continued her medications.  Today, patient arrives in good spirits. She is doing well today with no complaints. Reports adherence and no missed doses with 70/30 insulin, mealtime insulin, and metformin. No diarrhea or GI upset with metformin. She has been doing better with this insulin program versus a once daily basal insulin + TID bolus insulin.  Family/Social History:  DM- mother Tobacco: never smoker  Alcohol: none reported  Current diabetes medications include: Humulin N Kwikpen 12 units BID, Humalog 6 units TID (takes BID), metformin XR 1000 mg BID  Insurance coverage: UHC   Patient denies hypoglycemic events.  Brings in written home BG log from 12/1 - 12/19 Reported home fasting blood sugars: 121, 95, 152, 118, 250, 216, 265, 190, 162, 156, 106, 127, 147, 135, 75, 194, 138, 139; Avg: 154 mg/dL Reported 2 hour post-dinner/random blood sugars: 219, 147, 313, 166, 281, 95, 123, 207, 255, 203, 97, 127, 137, 128, 219, 311, 114, 166;  Avg: 184 mg/dL  Patient denies nocturia (nighttime urination) - previously was having but has since resolved Patient reports neuropathy (nerve pain) - both fingers and toes, started this week Patient denies visual changes. Patient reports self foot exams.   Patient reported dietary habits: Eats 2 meals/day Notes a decrease in bread, tortilla, sugar-containing foods  Patient reported exercise: none  O:  Lab Results  Component Value Date   HGBA1C 14.3 (A) 08/09/2023   There were no vitals filed for this visit.  Lipid Panel     Component Value Date/Time    CHOL 222 (H) 11/18/2021 1037   TRIG 116 11/18/2021 1037   HDL 66 11/18/2021 1037   CHOLHDL 3.4 11/18/2021 1037   CHOLHDL 4.2 05/19/2013 1703   VLDL 38 05/19/2013 1703   LDLCALC 136 (H) 11/18/2021 1037   Clinical Atherosclerotic Cardiovascular Disease (ASCVD): No  The ASCVD Risk score (Arnett DK, et al., 2019) failed to calculate for the following reasons:   The 2019 ASCVD risk score is only valid for ages 49 to 6   A/P: Diabetes longstanding currently uncontrolled based on A1c of 14.3% (07/2023). However, FBG avgs and PPD/random glucose avgs are improved. She does not currently have any hypoglycemia. Patient is able to verbalize appropriate hypoglycemia management plan. Medication adherence appears appropriate. -Increase Humulin N 14 units BID -Continue Humalog 6 units TID. -Continue metformin 1000 mg BID (takes 500 mg XR tablets) -Extensively discussed pathophysiology of diabetes, recommended lifestyle interventions, dietary effects on blood sugar control.  -Counseled on s/sx of and management of hypoglycemia.  -Next A1c anticipated 10/2023  Written patient instructions provided. Patient verbalized understanding of treatment plan.  Total time in face to face counseling 30 minutes.    Follow-up:  PCP clinic visit: 11/09/23  [name], PharmD, BCACP, CPP Clinical Pharmacist [facility] [phone]

## 2023-11-01 ENCOUNTER — Ambulatory Visit: Payer: Commercial Managed Care - PPO | Attending: Internal Medicine

## 2023-11-01 DIAGNOSIS — Z23 Encounter for immunization: Secondary | ICD-10-CM

## 2023-11-01 NOTE — Progress Notes (Signed)
Flu vaccine administered in per protocols.  Information sheet given. Patient denies and pain or discomfort at injection site. Tolerated injection well no reaction.

## 2023-11-09 ENCOUNTER — Ambulatory Visit: Payer: Commercial Managed Care - PPO | Attending: Internal Medicine | Admitting: Internal Medicine

## 2023-11-09 ENCOUNTER — Encounter: Payer: Self-pay | Admitting: Internal Medicine

## 2023-11-09 VITALS — BP 113/77 | HR 100 | Temp 97.9°F | Ht 64.0 in | Wt 157.0 lb

## 2023-11-09 DIAGNOSIS — E119 Type 2 diabetes mellitus without complications: Secondary | ICD-10-CM

## 2023-11-09 DIAGNOSIS — G43009 Migraine without aura, not intractable, without status migrainosus: Secondary | ICD-10-CM

## 2023-11-09 DIAGNOSIS — Z23 Encounter for immunization: Secondary | ICD-10-CM | POA: Diagnosis not present

## 2023-11-09 DIAGNOSIS — Z7984 Long term (current) use of oral hypoglycemic drugs: Secondary | ICD-10-CM | POA: Diagnosis not present

## 2023-11-09 DIAGNOSIS — E1165 Type 2 diabetes mellitus with hyperglycemia: Secondary | ICD-10-CM | POA: Diagnosis not present

## 2023-11-09 DIAGNOSIS — Z794 Long term (current) use of insulin: Secondary | ICD-10-CM | POA: Diagnosis not present

## 2023-11-09 LAB — POCT GLYCOSYLATED HEMOGLOBIN (HGB A1C): HbA1c, POC (controlled diabetic range): 10.8 % — AB (ref 0.0–7.0)

## 2023-11-09 LAB — GLUCOSE, POCT (MANUAL RESULT ENTRY): POC Glucose: 198 mg/dL — AB (ref 70–99)

## 2023-11-09 MED ORDER — INSULIN LISPRO (1 UNIT DIAL) 100 UNIT/ML (KWIKPEN): 9.0000 [IU] | PEN_INJECTOR | Freq: Three times a day (TID) | SUBCUTANEOUS | 3 refills | Status: DC

## 2023-11-09 MED ORDER — HUMULIN N KWIKPEN 100 UNIT/ML ~~LOC~~ SUPN: 17.0000 [IU] | PEN_INJECTOR | Freq: Two times a day (BID) | SUBCUTANEOUS | 11 refills | Status: DC

## 2023-11-09 MED ORDER — ONDANSETRON HCL 4 MG PO TABS: ORAL_TABLET | ORAL | 0 refills | Status: AC

## 2023-11-09 MED ORDER — KETOROLAC TROMETHAMINE 30 MG/ML IJ SOLN
30.0000 mg | Freq: Once | INTRAMUSCULAR | Status: AC
  Administered 2023-11-09: 30 mg via INTRAMUSCULAR

## 2023-11-09 MED ORDER — SUMATRIPTAN SUCCINATE 50 MG PO TABS: ORAL_TABLET | ORAL | 1 refills | Status: AC

## 2023-11-09 NOTE — Progress Notes (Signed)
Patient ID: Brenda Greer, female    DOB: [birth year]  MRN: [id number]  CC: Diabetes (DM f/u./Reports taking 10 units of KwikPen - currently out of insulin. Pharmacy will dispense more on 11/13/23/Vomiting, nausea, headache X3 days/Yes to pneumonia vax)   Subjective: Brenda Greer is a 38 y.o. female who presents for chronic ds management. Her concerns today include:  Pt has DM, HL, GERD,  Iron def anemia, migraines without aura.   [employer] Language interpreter used during this encounter. #Ana [phone], [id number] [id number]  DM: Results for orders placed or performed in visit on 11/09/23  POCT glucose (manual entry)   Collection Time: 11/09/23  3:19 PM  Result Value Ref Range   POC Glucose 198 (A) 70 - 99 mg/dl  POCT glycosylated hemoglobin (Hb A1C)   Collection Time: 11/09/23  3:26 PM  Result Value Ref Range   Hemoglobin A1C     HbA1c POC (<> result, manual entry)     HbA1c, POC (prediabetic range)     HbA1c, POC (controlled diabetic range) 10.8 (A) 0.0 - 7.0 %  A1C has improved from 14.3 on last visit Had issues getting the Toujeo  so changed to Humulin N 14 units BID -Continue Humalog 6 units TID. -Continue metformin 1000 mg BID (takes 500 mg XR tablets) Has been out of Humalog x 5 days.  She had been taking 10 units for the last several days prior to running out -checks BS BID before BF and after dinner.  Has log book with readings:  Before BF 160-303 and 2 hrs after dinner 202-347 Feels she is doing okay with eating habits.  Staying away from sugary drinks. [facility] has not been able to reach her to schedule eye exam Still taking Pravastatin as prescribed  C/o vomiting and HA x 3 days Similar to her migraine HA. HA all over.   Feels she has a fever now.   No blurred vision or photophobia.  No cough/congestion or abdominal pain Takes Tylenol 2 tabs Q hrs which helps some LNMP 10/26/2023 Patient Active Problem List   Diagnosis Date Noted   Type 2  diabetes mellitus with hyperglycemia (HCC) 05/14/2022   Abdominal wall cellulitis 05/13/2022   History of herpes genitalis 11/18/2021   Microalbuminuria due to type 2 diabetes mellitus (HCC) 01/22/2021   COVID-19 vaccine series completed 09/21/2020   Uncontrolled type 2 diabetes mellitus with peripheral neuropathy 07/18/2019   Migraine without aura and without status migrainosus, not intractable 07/18/2019   Hyperlipidemia associated with type 2 diabetes mellitus (HCC) 07/18/2019   Iron deficiency anemia due to chronic blood loss 07/02/2018   Hyperlipidemia 02/26/2018   Gastroesophageal reflux disease without esophagitis 02/26/2018   Diabetes mellitus without complication (HCC) 04/09/2017   Type II or unspecified type diabetes mellitus without mention of complication, uncontrolled 05/19/2013   OBESITY, NOS 12/13/2006     Current Outpatient Medications on File Prior to Visit  Medication Sig Dispense Refill   Blood Glucose Monitoring Suppl (ONETOUCH VERIO) w/Device KIT Use as instructed to check blood sugar three times daily. E11.42 1 kit 0   ferrous sulfate 325 (65 FE) MG tablet Take 1 tablet (325 mg total) by mouth daily with breakfast. 100 tablet 0   glucose blood (ONETOUCH VERIO) test strip Use as instructed to check blood sugar three times daily. E11.42 100 each 6   insulin lispro (HUMALOG KWIKPEN) 100 UNIT/ML KwikPen Inject 5 Units into the skin 3 (three) times daily. 15 mL 3   Insulin NPH,  Human,, Isophane, (HUMULIN N KWIKPEN) 100 UNIT/ML Kiwkpen Inject 12 Units into the skin 2 (two) times daily. At breakfast and dinner 15 mL 11   Insulin Pen Needle (BD PEN NEEDLE NANO U/F) 32G X 4 MM MISC Use to inject insulin in the morning and bedtime 100 each 0   metFORMIN (GLUCOPHAGE-XR) 500 MG 24 hr tablet Take 2 tablets (1,000 mg total) by mouth 2 (two) times daily with a meal. 180 tablet 2   OneTouch Delica Lancets 33G MISC Use as instructed to check blood sugar three times daily. E11.42 100  each 2   pravastatin (PRAVACHOL) 10 MG tablet Take 1 tablet (10 mg total) by mouth at bedtime. 30 tablet 2   sulfamethoxazole-trimethoprim (BACTRIM DS) 800-160 MG tablet Take 1 tablet by mouth 2 (two) times daily. 10 tablet 0   No current facility-administered medications on file prior to visit.    No Known Allergies  Social History   Socioeconomic History   Marital status: Married    Spouse name: Not on file   Number of children: Not on file   Years of education: Not on file   Highest education level: Not on file  Occupational History   Not on file  Tobacco Use   Smoking status: Never   Smokeless tobacco: Never  Substance and Sexual Activity   Alcohol use: No   Drug use: No   Sexual activity: Not on file  Other Topics Concern   Not on file  Social History Narrative   Not on file   Social Drivers of Health   Financial Resource Strain: Not on file  Food Insecurity: Not on file  Transportation Needs: Not on file  Physical Activity: Not on file  Stress: Not on file  Social Connections: Not on file  Intimate Partner Violence: Not on file    Family History  Problem Relation Age of Onset   Diabetes Mother    Hypertension Mother     No past surgical history on file.  ROS: Review of Systems Negative except as stated above  PHYSICAL EXAM: BP 113/77 (BP Location: Left Arm, Patient Position: Sitting, Cuff Size: Normal)   Pulse 100   Temp 97.9 F (36.6 C) (Oral)   Ht 5\' 4"  (1.626 m)   Wt 157 lb (71.2 kg)   SpO2 100%   BMI 26.95 kg/m   Physical Exam  General appearance - alert, well appearing, and in no distress Mental status - normal mood, behavior, speech, dress, motor activity, and thought processes Neck - supple, no significant adenopathy Chest - clear to auscultation, no wheezes, rales or rhonchi, symmetric air entry Heart - normal rate, regular rhythm, normal S1, S2, no murmurs, rubs, clicks or gallops Neurological - cranial nerves II through XII  intact, motor and sensory grossly normal bilaterally Extremities - peripheral pulses normal, no pedal edema, no clubbing or cyanosis      Latest Ref Rng & Units 08/09/2023    4:32 PM 08/03/2023    8:14 PM 05/15/2022    4:54 AM  CMP  Glucose 70 - 99 mg/dL 130  865  784   BUN 6 - 20 mg/dL 10  8  <5   Creatinine 0.57 - 1.00 mg/dL 6.96  2.95  2.84   Sodium 134 - 144 mmol/L 134  129  137   Potassium 3.5 - 5.2 mmol/L 4.0  4.1  3.6   Chloride 96 - 106 mmol/L 101  99  108   CO2 20 - 29 mmol/L  20  19  23    Calcium 8.7 - 10.2 mg/dL 9.2  9.3  8.6   Total Protein 6.5 - 8.1 g/dL  7.9  5.7   Total Bilirubin 0.3 - 1.2 mg/dL  1.0  0.1   Alkaline Phos 38 - 126 U/L  66  52   AST 15 - 41 U/L  56  25   ALT 0 - 44 U/L  64  21    Lipid Panel     Component Value Date/Time   CHOL 222 (H) 11/18/2021 1037   TRIG 116 11/18/2021 1037   HDL 66 11/18/2021 1037   CHOLHDL 3.4 11/18/2021 1037   CHOLHDL 4.2 05/19/2013 1703   VLDL 38 05/19/2013 1703   LDLCALC 136 (H) 11/18/2021 1037    CBC    Component Value Date/Time   WBC 12.8 (H) 08/03/2023 2014   RBC 4.42 08/03/2023 2014   HGB 12.9 08/03/2023 2014   HGB 12.7 10/19/2022 1632   HCT 38.8 08/03/2023 2014   HCT 39.8 10/19/2022 1632   PLT 175 08/03/2023 2014   PLT 163 10/19/2022 1632   MCV 87.8 08/03/2023 2014   MCV 85 10/19/2022 1632   MCH 29.2 08/03/2023 2014   MCHC 33.2 08/03/2023 2014   RDW 13.7 08/03/2023 2014   RDW 13.8 10/19/2022 1632   LYMPHSABS 1.3 08/03/2023 2014   MONOABS 0.8 08/03/2023 2014   EOSABS 0.1 08/03/2023 2014   BASOSABS 0.0 08/03/2023 2014    ASSESSMENT AND PLAN: 1. Type 2 diabetes mellitus with hyperglycemia, with long-term current use of insulin (HCC) (Primary) A1c has improved but still much above goal.  Most of her blood sugar readings are still in the 200s.  I recommend increasing Humulin and to 17 units BID and Humalog to 9 units with meals Commended her for checking and keeping a log of blood sugars.  Will have  her follow-up with the clinical pharmacist in 1 month.  Advised to bring her readings with her again. - POCT glycosylated hemoglobin (Hb A1C) - POCT glucose (manual entry) - Insulin NPH, Human,, Isophane, (HUMULIN N KWIKPEN) 100 UNIT/ML Kiwkpen; Inject 17 Units into the skin 2 (two) times daily. At breakfast and dinner  Dispense: 15 mL; Refill: 11 - insulin lispro (HUMALOG KWIKPEN) 100 UNIT/ML KwikPen; Inject 9 Units into the skin 3 (three) times daily.  Dispense: 15 mL; Refill: 3 - Microalbumin / creatinine urine ratio  2. Diabetes mellitus treated with oral medication (HCC) See #1 above  3. Migraine without aura and without status migrainosus, not intractable Given Toradol shot today.  Zofran prescribed to use as needed for nausea/vomiting.  She was on Imitrex in the past.  Prescribed today.  I went over with her how to use it. - ketorolac (TORADOL) 30 MG/ML injection 30 mg - SUMAtriptan (IMITREX) 50 MG tablet; 1 tab at start of headache. May repeat in 2 hours if headache persists or recurs. Max 2 tabs/24 hours  Dispense: 10 tablet; Refill: 1 - ondansetron (ZOFRAN) 4 MG tablet; 1 tab PO daily PRN vomiting  Dispense: 10 tablet; Refill: 0  4. Need for vaccination against Streptococcus pneumoniae - PNEUMOCOCCAL CONJUGATE VACCINE 15-VALENT     Patient was given the opportunity to ask questions.  Patient verbalized understanding of the plan and was able to repeat key elements of the plan.   This documentation was completed using Paediatric nurse.  Any transcriptional errors are unintentional.  Orders Placed This Encounter  Procedures   POCT glycosylated hemoglobin (Hb  A1C)   POCT glucose (manual entry)     Requested Prescriptions    No prescriptions requested or ordered in this encounter    No follow-ups on file.  Jonah Blue, MD, FACP

## 2023-11-09 NOTE — Patient Instructions (Addendum)
Please call [facility] to schedule your eye appointment ph # [phone] address [address]

## 2023-11-11 LAB — MICROALBUMIN / CREATININE URINE RATIO
Creatinine, Urine: 14.3 mg/dL
Microalb/Creat Ratio: 53 mg/g{creat} — ABNORMAL HIGH (ref 0–29)
Microalbumin, Urine: 7.6 ug/mL

## 2023-12-16 NOTE — Progress Notes (Deleted)
 S:    PCP: Dr. Laural Greer  Interpreter: Brenda Greer  # *** 38 y.o. female who presents for diabetes evaluation, education, and management. DM diagnosed during her second pregnancy, ~17 years ago. Has been on insulin since. PMH is significant for T2DM, HLD, chronic anemia, and migraines.   Patient was referred by Primary Care Provider, Dr. Laural Greer, on 11/08/2022 at which time A1c improved from 14.3% to 10.8%. At this appt Humulin N was increased to 17 units BID with meals and Humalog was increased to 9 units with meals.  Today, patient arrives in good spirits. *** She is doing well today with no complaints. Reports adherence and no missed doses with 70/30 insulin, mealtime insulin, and metformin. No diarrhea or GI upset with metformin. She has been doing better with this insulin program versus a once daily basal insulin + TID bolus insulin.  Family/Social History:  DM- mother Tobacco: never smoker  Alcohol: none reported  Current diabetes medications include: Humulin N Kwikpen 17 units BID, Humalog 9 units TID (takes BID), metformin XR 1000 mg BID  Insurance coverage: UHC   Patient denies hypoglycemic events.***  Brings in written home BG log from 12/1 - 12/19*** Reported home fasting blood sugars: 121, 95, 152, 118, 250, 216, 265, 190, 162, 156, 106, 127, 147, 135, 75, 194, 138, 139; Avg: 154 mg/dL Reported 2 hour post-dinner/random blood sugars: 219, 147, 313, 166, 281, 95, 123, 207, 255, 203, 97, 127, 137, 128, 219, 311, 114, 166;  Avg: 184 mg/dL  Patient denies nocturia (nighttime urination) - previously was having but has since resolved*** Patient reports neuropathy (nerve pain) - both fingers and toes, started this week Patient denies visual changes. Patient reports self foot exams.   Patient reported dietary habits: Eats 2 meals/day*** Notes a decrease in bread, tortilla, sugar-containing foods  Patient reported exercise: none  O:  Lab Results  Component Value Date   HGBA1C  10.8 (A) 11/09/2023   There were no vitals filed for this visit.  UACR 11/09/23: 53 mg/g  Lipid Panel     Component Value Date/Time   CHOL 222 (H) 11/18/2021 1037   TRIG 116 11/18/2021 1037   HDL 66 11/18/2021 1037   CHOLHDL 3.4 11/18/2021 1037   CHOLHDL 4.2 05/19/2013 1703   VLDL 38 05/19/2013 1703   LDLCALC 136 (H) 11/18/2021 1037   Clinical Atherosclerotic Cardiovascular Disease (ASCVD): No  The ASCVD Risk score (Arnett DK, et al., 2019) failed to calculate for the following reasons:   The 2019 ASCVD risk score is only valid for ages 58 to 97   Test claims for analog insulin and GLP1? Good candidate for SGTL2i once A1c is better controlled CGM?? Probably cost barrier  A/P: Diabetes longstanding currently uncontrolled based on A1c of 14.3% (07/2023). However, FBG avgs and PPD/random glucose avgs are improved. She does not currently have any hypoglycemia. Patient is able to verbalize appropriate hypoglycemia management plan. Medication adherence appears appropriate. -Increase Humulin N 14 units BID -Continue Humalog 6 units TID. -Continue metformin 1000 mg BID (takes 500 mg XR tablets) -Extensively discussed pathophysiology of diabetes, recommended lifestyle interventions, dietary effects on blood sugar control.  -Counseled on s/sx of and management of hypoglycemia.  -Next A1c anticipated 10/2023  Written patient instructions provided. Patient verbalized understanding of treatment plan.  Total time in face to face counseling 30 minutes.    Follow-up:  PCP clinic visit: 11/09/23  [name], PharmD, BCACP, CPP Clinical Pharmacist [facility] [phone]

## 2023-12-17 ENCOUNTER — Ambulatory Visit: Payer: Commercial Managed Care - PPO | Admitting: Pharmacist

## 2024-01-02 ENCOUNTER — Other Ambulatory Visit: Payer: Self-pay | Admitting: Internal Medicine

## 2024-01-02 DIAGNOSIS — E1169 Type 2 diabetes mellitus with other specified complication: Secondary | ICD-10-CM

## 2024-02-26 ENCOUNTER — Other Ambulatory Visit: Payer: Self-pay | Admitting: Internal Medicine

## 2024-02-26 DIAGNOSIS — E785 Hyperlipidemia, unspecified: Secondary | ICD-10-CM

## 2024-02-26 MED ORDER — PRAVASTATIN SODIUM 10 MG PO TABS
10.0000 mg | ORAL_TABLET | Freq: Every day | ORAL | 1 refills | Status: DC
Start: 2024-02-26 — End: 2024-07-11

## 2024-03-14 ENCOUNTER — Ambulatory Visit: Payer: Self-pay | Admitting: Internal Medicine

## 2024-04-30 ENCOUNTER — Ambulatory Visit: Payer: Commercial Managed Care - PPO

## 2024-05-28 ENCOUNTER — Telehealth: Payer: Self-pay

## 2024-05-28 NOTE — Telephone Encounter (Signed)
 Copied from CRM [id number]. Topic: Clinical - Prescription Issue >> May 28, 2024  3:21 PM Selinda RAMAN wrote: Reason for CRM: The patient called in with assistance from an interpreter stating she has been without her insulin  for about 2 months because it is too expensive even with her insurance. Her sugar has been around 230-240. Is there any other cheaper alternative. She also missed her last few appointments as she has not had transportation. Please assist patient further as soon as possible.

## 2024-05-28 NOTE — Telephone Encounter (Signed)
 Has she been out of both of her insulins? Are both too expensive or just the meal time one because the Humulin  N, the one that she was taking 17 units twice a day should not be too expensive.

## 2024-06-02 NOTE — Telephone Encounter (Signed)
 Called & spoke to the patient. Verified name & DOB. Patient confirmed that she has been out of the Humulin  for the last 2 months. She stated that the price for the Humulin  is $100 and is too expensive. Patient confirmed taking the other insulin  and is able to afford it because it is $35. Please advise if there is any alternatives.

## 2024-06-02 NOTE — Telephone Encounter (Signed)
 Brenda Greer can you please help us  figure out an insulin  that would be cheaper for her based on her insurance?  She should be on Humulin  N 17 units twice a day and Humalog  with meals.  If I am understanding her message correctly, she is stating that the Humulin  is costing him $100 which she cannot afford but the Humalog  cost only $35.  Would it be cheaper for her to get her medications at pharmacy or no?

## 2024-06-03 ENCOUNTER — Other Ambulatory Visit: Payer: Self-pay | Admitting: Pharmacist

## 2024-06-04 ENCOUNTER — Other Ambulatory Visit: Payer: Self-pay

## 2024-06-04 MED ORDER — NOVOLIN N FLEXPEN 100 UNIT/ML ~~LOC~~ SUPN
17.0000 [IU] | PEN_INJECTOR | Freq: Two times a day (BID) | SUBCUTANEOUS | 11 refills | Status: DC
  Filled 2024-06-04: qty 15, 30d supply, fill #0

## 2024-06-04 NOTE — Telephone Encounter (Signed)
 Called and spoke to the patient. Verified name & DOB. Informed that a new prescription for Novolin  has been sent to our pharmacy for $35. Advised that this will take the place of the Humulin  insulin  pens. Informed to take Novolin  17 units twice a day as previously prescribed.  Informed to ask for [place] to make sure that the discount is applied towards the purchase.  Follow-up appointment scheduled with PCP for Friday 06/06/2024. Patient expressed verbal understanding of all discussed and confirmed appointment.

## 2024-06-04 NOTE — Telephone Encounter (Signed)
 Thank you Burnard. Clarisa: please call pt and let her know that she can get Novolin  N insulin  pens at our pharmacy for $35. This will take the place of the Humulin  N insulin . Take 17 units twice a day as was previously prescribed.  When she comes, she should ask for Burnard who will apply a discount card towards purchase. Please give f/u appt with me.

## 2024-06-04 NOTE — Telephone Encounter (Signed)
 So Burnard are you saying that she should be able to get 1 mth supply of Humulin  N pens taking 17 units twice a day for $35 with a co-pay card? Where can she get the co-pay card? Also if this does not work out, then can she get the Humulin  N vials OTC for $35? Just need to know how to direct this pt.

## 2024-06-04 NOTE — Addendum Note (Signed)
 Addended by: VICCI SOBER B on: 06/04/2024 12:58 PM   Modules accepted: Orders

## 2024-06-06 ENCOUNTER — Ambulatory Visit: Admitting: Internal Medicine

## 2024-06-10 ENCOUNTER — Other Ambulatory Visit: Payer: Self-pay

## 2024-06-17 ENCOUNTER — Other Ambulatory Visit (HOSPITAL_COMMUNITY): Payer: Self-pay

## 2024-06-25 ENCOUNTER — Other Ambulatory Visit: Payer: Self-pay

## 2024-07-10 ENCOUNTER — Telehealth: Payer: Self-pay | Admitting: Internal Medicine

## 2024-07-10 NOTE — Telephone Encounter (Signed)
 Confirmed appt for 9/26

## 2024-07-11 ENCOUNTER — Other Ambulatory Visit: Payer: Self-pay

## 2024-07-11 ENCOUNTER — Ambulatory Visit: Attending: Internal Medicine | Admitting: Internal Medicine

## 2024-07-11 ENCOUNTER — Encounter: Payer: Self-pay | Admitting: Internal Medicine

## 2024-07-11 VITALS — BP 113/72 | HR 106 | Temp 98.6°F | Ht 64.0 in | Wt 150.0 lb

## 2024-07-11 DIAGNOSIS — E1165 Type 2 diabetes mellitus with hyperglycemia: Secondary | ICD-10-CM | POA: Diagnosis not present

## 2024-07-11 DIAGNOSIS — Z7984 Long term (current) use of oral hypoglycemic drugs: Secondary | ICD-10-CM

## 2024-07-11 DIAGNOSIS — Z23 Encounter for immunization: Secondary | ICD-10-CM

## 2024-07-11 DIAGNOSIS — E119 Type 2 diabetes mellitus without complications: Secondary | ICD-10-CM | POA: Diagnosis not present

## 2024-07-11 DIAGNOSIS — E785 Hyperlipidemia, unspecified: Secondary | ICD-10-CM

## 2024-07-11 DIAGNOSIS — E1169 Type 2 diabetes mellitus with other specified complication: Secondary | ICD-10-CM | POA: Diagnosis not present

## 2024-07-11 DIAGNOSIS — Z794 Long term (current) use of insulin: Secondary | ICD-10-CM

## 2024-07-11 LAB — GLUCOSE, POCT (MANUAL RESULT ENTRY): POC Glucose: 220 mg/dL — AB (ref 70–99)

## 2024-07-11 LAB — POCT GLYCOSYLATED HEMOGLOBIN (HGB A1C): HbA1c POC (<> result, manual entry): >15 % (ref 4.0–5.6)

## 2024-07-11 MED ORDER — NOVOLIN N FLEXPEN 100 UNIT/ML ~~LOC~~ SUPN
22.0000 [IU] | PEN_INJECTOR | Freq: Two times a day (BID) | SUBCUTANEOUS | 11 refills | Status: AC
  Filled 2024-07-11: qty 15, 30d supply, fill #0
  Filled 2024-09-08: qty 15, 30d supply, fill #1
  Filled 2024-10-22: qty 15, 30d supply, fill #2

## 2024-07-11 MED ORDER — METFORMIN HCL ER 500 MG PO TB24: 1000.0000 mg | ORAL_TABLET | Freq: Two times a day (BID) | ORAL | 2 refills | Status: AC

## 2024-07-11 MED ORDER — PRAVASTATIN SODIUM 10 MG PO TABS: 10.0000 mg | ORAL_TABLET | Freq: Every day | ORAL | 1 refills | Status: AC

## 2024-07-11 MED ORDER — FREESTYLE LIBRE 3 PLUS SENSOR MISC
10 refills | Status: AC
  Filled 2024-07-11: qty 2, 30d supply, fill #0

## 2024-07-11 MED ORDER — INSULIN LISPRO (1 UNIT DIAL) 100 UNIT/ML (KWIKPEN): 12.0000 [IU] | PEN_INJECTOR | Freq: Three times a day (TID) | SUBCUTANEOUS | 3 refills | Status: AC

## 2024-07-11 NOTE — Progress Notes (Signed)
 Patient ID: Brenda Greer, female    DOB: 1986-07-05  MRN: [id number]  CC: Diabetes (DM f/u. Med refills. /No questions / concerns/Flu vax administered on 07/11/2024 - C.A.)   Subjective: Brenda Greer is a 38 y.o. female who presents for chronic ds management. Her concerns today include:  Pt has DM, HL, GERD,  Iron def anemia, migraines without aura.   [employer] Language interpreter used during this encounter. #[id number]  Discussed the use of AI scribe software for clinical note transcription with the patient, who gave verbal consent to proceed.  History of Present Illness Brenda Greer is a 38 year old female with diabetes and hyperlipidemia who presents for follow-up.  DM: Results for orders placed or performed in visit on 07/11/24  POCT glucose (manual entry)   Collection Time: 07/11/24  2:20 PM  Result Value Ref Range   POC Glucose 220 (A) 70 - 99 mg/dl  POCT glycosylated hemoglobin (Hb A1C)   Collection Time: 07/11/24  2:24 PM  Result Value Ref Range   Hemoglobin A1C     HbA1c POC (<> result, manual entry) >15.0 4.0 - 5.6 %   HbA1c, POC (prediabetic range)     HbA1c, POC (controlled diabetic range)    Her last visit was in January 2025, with an A1c of 10.8. Currently, her A1c is greater than 15, and her blood sugar level today is 220 mg/dL. She has been experiencing issues affording her insulin , specifically Humulin , however she was able to get it more affordable through our pharmacy. She is currently taking Humulin  N (rxn in our system says Novolin  N; likely substituted at the time) at 17 units twice a day and Humalog  at 9 units with meals. She obtains the N insulin  from the clinic's pharmacy and the other from [place] due to cost considerations. She mentions losing the cap of one insulin  pen and is unsure how to refill it.  She has been taking her insulin  consistently since June 09, 2024. She is also on metformin , 500 mg, two tablets once a  day. However, she has not been checking her blood sugars at home recently, although she does have a device for it. She previously attempted to obtain a continuous glucose monitor, but it was not covered by her insurance.  Regarding her diet, she states she is doing well, primarily drinking diet soda and water, and avoiding sugary snacks and drinks. She has been out of her cholesterol medication, pravastatin , and metformin  for about a month due to a lack of refills.    Patient Active Problem List   Diagnosis Date Noted   Type 2 diabetes mellitus with hyperglycemia (HCC) 05/14/2022   Abdominal wall cellulitis 05/13/2022   History of herpes genitalis 11/18/2021   Microalbuminuria due to type 2 diabetes mellitus (HCC) 01/22/2021   COVID-19 vaccine series completed 09/21/2020   Uncontrolled type 2 diabetes mellitus with peripheral neuropathy 07/18/2019   Migraine without aura and without status migrainosus, not intractable 07/18/2019   Hyperlipidemia associated with type 2 diabetes mellitus (HCC) 07/18/2019   Iron deficiency anemia due to chronic blood loss 07/02/2018   Hyperlipidemia 02/26/2018   Gastroesophageal reflux disease without esophagitis 02/26/2018   Diabetes mellitus without complication (HCC) 04/09/2017   Type II or unspecified type diabetes mellitus without mention of complication, uncontrolled 05/19/2013   OBESITY, NOS 12/13/2006     Current Outpatient Medications on File Prior to Visit  Medication Sig Dispense Refill   Blood Glucose Monitoring Suppl (ONETOUCH VERIO) w/Device KIT Use  as instructed to check blood sugar three times daily. E11.42 1 kit 0   glucose blood (ONETOUCH VERIO) test strip Use as instructed to check blood sugar three times daily. E11.42 100 each 6   Insulin  Pen Needle (BD PEN NEEDLE NANO U/F) 32G X 4 MM MISC Use to inject insulin  in the morning and bedtime 100 each 0   ondansetron  (ZOFRAN ) 4 MG tablet 1 tab PO daily PRN vomiting 10 tablet 0   OneTouch  Delica Lancets 33G MISC Use as instructed to check blood sugar three times daily. E11.42 100 each 2   ferrous sulfate  325 (65 FE) MG tablet Take 1 tablet (325 mg total) by mouth daily with breakfast. (Patient not taking: Reported on 07/11/2024) 100 tablet 0   SUMAtriptan  (IMITREX ) 50 MG tablet 1 tab at start of headache. May repeat in 2 hours if headache persists or recurs. Max 2 tabs/24 hours (Patient not taking: Reported on 07/11/2024) 10 tablet 1   No current facility-administered medications on file prior to visit.    No Known Allergies  Social History   Socioeconomic History   Marital status: Married    Spouse name: Not on file   Number of children: Not on file   Years of education: Not on file   Highest education level: Not on file  Occupational History   Not on file  Tobacco Use   Smoking status: Never   Smokeless tobacco: Never  Substance and Sexual Activity   Alcohol use: No   Drug use: No   Sexual activity: Not on file  Other Topics Concern   Not on file  Social History Narrative   Not on file   Social Drivers of Health   Financial Resource Strain: Not on file  Food Insecurity: Not on file  Transportation Needs: Not on file  Physical Activity: Not on file  Stress: Not on file  Social Connections: Not on file  Intimate Partner Violence: Not on file    Family History  Problem Relation Age of Onset   Diabetes Mother    Hypertension Mother     No past surgical history on file.  ROS: Review of Systems Negative except as stated above  PHYSICAL EXAM: BP 113/72 (BP Location: Left Arm, Patient Position: Sitting, Cuff Size: Normal)   Pulse (!) 106   Temp 98.6 F (37 C) (Oral)   Ht 5' 4 (1.626 m)   Wt 150 lb (68 kg)   SpO2 99%   BMI 25.75 kg/m   Wt Readings from Last 3 Encounters:  07/11/24 150 lb (68 kg)  11/09/23 157 lb (71.2 kg)  08/09/23 148 lb (67.1 kg)    Physical Exam  General appearance - alert, well appearing, middle age Hispanic female  and in no distress Mental status - normal mood, behavior, speech, dress, motor activity, and thought processes Neck - supple, no significant adenopathy Chest - clear to auscultation, no wheezes, rales or rhonchi, symmetric air entry Heart - normal rate, regular rhythm, normal S1, S2, no murmurs, rubs, clicks or gallops Extremities - peripheral pulses normal, no pedal edema, no clubbing or cyanosis      Latest Ref Rng & Units 08/09/2023    4:32 PM 08/03/2023    8:14 PM 05/15/2022    4:54 AM  CMP  Glucose 70 - 99 mg/dL 622  602  822   BUN 6 - 20 mg/dL 10  8  <5   Creatinine 0.57 - 1.00 mg/dL 9.45  9.46  9.66  Sodium 134 - 144 mmol/L 134  129  137   Potassium 3.5 - 5.2 mmol/L 4.0  4.1  3.6   Chloride 96 - 106 mmol/L 101  99  108   CO2 20 - 29 mmol/L 20  19  23    Calcium 8.7 - 10.2 mg/dL 9.2  9.3  8.6   Total Protein 6.5 - 8.1 g/dL  7.9  5.7   Total Bilirubin 0.3 - 1.2 mg/dL  1.0  0.1   Alkaline Phos 38 - 126 U/L  66  52   AST 15 - 41 U/L  56  25   ALT 0 - 44 U/L  64  21    Lipid Panel     Component Value Date/Time   CHOL 222 (H) 11/18/2021 1037   TRIG 116 11/18/2021 1037   HDL 66 11/18/2021 1037   CHOLHDL 3.4 11/18/2021 1037   CHOLHDL 4.2 05/19/2013 1703   VLDL 38 05/19/2013 1703   LDLCALC 136 (H) 11/18/2021 1037    CBC    Component Value Date/Time   WBC 12.8 (H) 08/03/2023 2014   RBC 4.42 08/03/2023 2014   HGB 12.9 08/03/2023 2014   HGB 12.7 10/19/2022 1632   HCT 38.8 08/03/2023 2014   HCT 39.8 10/19/2022 1632   PLT 175 08/03/2023 2014   PLT 163 10/19/2022 1632   MCV 87.8 08/03/2023 2014   MCV 85 10/19/2022 1632   MCH 29.2 08/03/2023 2014   MCHC 33.2 08/03/2023 2014   RDW 13.7 08/03/2023 2014   RDW 13.8 10/19/2022 1632   LYMPHSABS 1.3 08/03/2023 2014   MONOABS 0.8 08/03/2023 2014   EOSABS 0.1 08/03/2023 2014   BASOSABS 0.0 08/03/2023 2014    ASSESSMENT AND PLAN: 1. Type 2 diabetes mellitus with hyperglycemia, with long-term current use of insulin  (HCC)  (Primary) Not at goal.  She had some issues several weeks ago and being able to afford Humulin  N.  She will be getting this through our pharmacy has Novolin  N.  Recommend increased dose to 22 units twice a day.  Continue Humalog  but increase dose from 9 units with meals to 12 units with meals.  Continue metformin .  Refill given. -Looks like her insurance may now cover for continuous glucose monitor.  I have sent that prescription to her pharmacy.  If not, advised to check blood sugars at least twice a day before meals. Encourage healthy eating habits. - metFORMIN  (GLUCOPHAGE -XR) 500 MG 24 hr tablet; Take 2 tablets (1,000 mg total) by mouth 2 (two) times daily with a meal.  Dispense: 180 tablet; Refill: 2 - CBC - Comprehensive metabolic panel with GFR - Lipid panel - Continuous Glucose Sensor (FREESTYLE LIBRE 3 PLUS SENSOR) MISC; Change sensor every 15 days.  Dispense: 2 each; Refill: 10 - insulin  lispro (HUMALOG  KWIKPEN) 100 UNIT/ML KwikPen; Inject 12 Units into the skin 3 (three) times daily.  Dispense: 15 mL; Refill: 3 - Insulin  NPH, Human,, Isophane, (NOVOLIN  N FLEXPEN) 100 UNIT/ML Kiwkpen; Inject 22 Units into the skin in the morning and at bedtime.  Dispense: 15 mL; Refill: 11 - Ambulatory referral to Ophthalmology  2. Diabetes mellitus treated with oral medication (HCC) See #1 above. - POCT glycosylated hemoglobin (Hb A1C) - POCT glucose (manual entry)  3. Hyperlipidemia associated with type 2 diabetes mellitus (HCC) RF Statin.  Encourage compliance. - pravastatin  (PRAVACHOL ) 10 MG tablet; Take 1 tablet (10 mg total) by mouth at bedtime.  Dispense: 90 tablet; Refill: 1  4. Need for influenza vaccination -  Flu vaccine trivalent PF, 6mos and older(Flulaval,Afluria,Fluarix,Fluzone)  Patient was given the opportunity to ask questions.  Patient verbalized understanding of the plan and was able to repeat key elements of the plan.   This documentation was completed using Social research officer, government.  Any transcriptional errors are unintentional.  Orders Placed This Encounter  Procedures   Flu vaccine trivalent PF, 6mos and older(Flulaval,Afluria,Fluarix,Fluzone)   CBC   Comprehensive metabolic panel with GFR   Lipid panel   Ambulatory referral to Ophthalmology   POCT glycosylated hemoglobin (Hb A1C)   POCT glucose (manual entry)     Requested Prescriptions   Signed Prescriptions Disp Refills   metFORMIN  (GLUCOPHAGE -XR) 500 MG 24 hr tablet 180 tablet 2    Sig: Take 2 tablets (1,000 mg total) by mouth 2 (two) times daily with a meal.   pravastatin  (PRAVACHOL ) 10 MG tablet 90 tablet 1    Sig: Take 1 tablet (10 mg total) by mouth at bedtime.   Continuous Glucose Sensor (FREESTYLE LIBRE 3 PLUS SENSOR) MISC 2 each 10    Sig: Change sensor every 15 days.   insulin  lispro (HUMALOG  KWIKPEN) 100 UNIT/ML KwikPen 15 mL 3    Sig: Inject 12 Units into the skin 3 (three) times daily.   Insulin  NPH, Human,, Isophane, (NOVOLIN  N FLEXPEN) 100 UNIT/ML Kiwkpen 15 mL 11    Sig: Inject 22 Units into the skin in the morning and at bedtime.    Return in about 3 months (around 10/10/2024) for 4 weeks with clinical pharmacist for DM.  Barnie Louder, MD, FACP

## 2024-07-11 NOTE — Patient Instructions (Signed)
  VISIT SUMMARY: Today, you came in for a follow-up visit to manage your diabetes and hyperlipidemia. We discussed your current medications, blood sugar levels, and challenges with insulin  affordability. We also reviewed your diet and medication adherence.  YOUR PLAN: -TYPE 2 DIABETES MELLITUS WITH HYPERGLYCEMIA: Type 2 diabetes is a condition where your body does not use insulin  properly, leading to high blood sugar levels. Your A1c is currently greater than 15, and your blood sugar level today is 220 mg/dL. We have increased your Novolin  to 22 units twice daily and Humalog  to 12 units with meals. We will send a prescription for a continuous glucose monitor to the pharmacy to check if your insurance will cover it. Please refill your Novolin  at the clinic pharmacy today and follow up with Dr. Herlene, the clinical pharmacist, in one month to assess your blood glucose control. Additionally, you should obtain a new insulin  pen from [facility] for the one with the lost cap.  -HYPERLIPIDEMIA: Hyperlipidemia is a condition where you have high levels of fats (lipids) in your blood, which can increase your risk of heart disease. You have not been taking your pravastatin  for about a month due to a lack of refills. We will refill your pravastatin  prescription at [facility] and order blood tests to check your cholesterol, kidney, and liver function.  INSTRUCTIONS: Please follow up with Dr. Herlene, the clinical pharmacist, in one month to assess your blood glucose control. Refill your Novolin  at the clinic pharmacy today and obtain a new insulin  pen from [facility] for the one with the lost cap. We will also send a prescription for a continuous glucose monitor to the pharmacy to check if your insurance will cover it. Additionally, refill your pravastatin  prescription at [facility] and complete the blood tests for cholesterol, kidney, and liver function as ordered.                      Contains text generated by  Abridge.                                 Contains text generated by Abridge.

## 2024-07-12 ENCOUNTER — Ambulatory Visit: Payer: Self-pay | Admitting: Internal Medicine

## 2024-07-12 LAB — LIPID PANEL
Chol/HDL Ratio: 3.1 ratio (ref 0.0–4.4)
Cholesterol, Total: 228 mg/dL — ABNORMAL HIGH (ref 100–199)
HDL: 73 mg/dL (ref >39)
LDL Chol Calc (NIH): 139 mg/dL — ABNORMAL HIGH (ref 0–99)
Triglycerides: 93 mg/dL (ref 0–149)
VLDL Cholesterol Cal: 16 mg/dL (ref 5–40)

## 2024-07-12 LAB — COMPREHENSIVE METABOLIC PANEL WITH GFR
ALT: 39 IU/L — ABNORMAL HIGH (ref 0–32)
AST: 45 IU/L — ABNORMAL HIGH (ref 0–40)
Albumin: 4.2 g/dL (ref 3.9–4.9)
Alkaline Phosphatase: 76 IU/L (ref 41–116)
BUN/Creatinine Ratio: 18 (ref 9–23)
BUN: 10 mg/dL (ref 6–20)
Bilirubin Total: 0.3 mg/dL (ref 0.0–1.2)
CO2: 20 mmol/L (ref 20–29)
Calcium: 9.8 mg/dL (ref 8.7–10.2)
Chloride: 100 mmol/L (ref 96–106)
Creatinine, Ser: 0.55 mg/dL — ABNORMAL LOW (ref 0.57–1.00)
Globulin, Total: 3.2 g/dL (ref 1.5–4.5)
Glucose: 90 mg/dL (ref 70–99)
Potassium: 4 mmol/L (ref 3.5–5.2)
Sodium: 136 mmol/L (ref 134–144)
Total Protein: 7.4 g/dL (ref 6.0–8.5)
eGFR: 119 mL/min/1.73 (ref >59)

## 2024-07-12 LAB — CBC
Hematocrit: 34.7 % (ref 34.0–46.6)
Hemoglobin: 10.1 g/dL — ABNORMAL LOW (ref 11.1–15.9)
MCH: 22.3 pg — ABNORMAL LOW (ref 26.6–33.0)
MCHC: 29.1 g/dL — ABNORMAL LOW (ref 31.5–35.7)
MCV: 77 fL — ABNORMAL LOW (ref 79–97)
Platelets: 292 x10E3/uL (ref 150–450)
RBC: 4.53 x10E6/uL (ref 3.77–5.28)
RDW: 14.3 % (ref 11.7–15.4)
WBC: 7.8 x10E3/uL (ref 3.4–10.8)

## 2024-07-12 MED ORDER — FERROUS SULFATE 325 (65 FE) MG PO TABS: 325.0000 mg | ORAL_TABLET | Freq: Every day | ORAL | 1 refills | Status: AC

## 2024-07-14 ENCOUNTER — Other Ambulatory Visit: Payer: Self-pay

## 2024-07-21 ENCOUNTER — Telehealth: Payer: Self-pay

## 2024-07-21 ENCOUNTER — Other Ambulatory Visit: Payer: Self-pay

## 2024-07-21 NOTE — Telephone Encounter (Signed)
 Copied from CRM [id number]. Topic: Clinical - Prescription Issue >> Jul 21, 2024 10:58 AM Delon T wrote: Reason for CRM: insulin  lispro (HUMALOG  KWIKPEN) 100 UNIT/ML KwikPen- medication too expensive - [phone]

## 2024-07-21 NOTE — Telephone Encounter (Signed)
 Can one of you help with this? Would Novolog  be cheaper?

## 2024-07-21 NOTE — Telephone Encounter (Signed)
 Please see Brenda Greer's reply to the patient's message and my message.  See if patient would like to get the Humalog  through our pharmacy since it would be cheaper for her.

## 2024-07-22 ENCOUNTER — Telehealth: Payer: Self-pay | Admitting: Internal Medicine

## 2024-07-22 NOTE — Telephone Encounter (Signed)
 Called but no answer. LVM to call back.

## 2024-07-22 NOTE — Telephone Encounter (Signed)
 Copied from CRM #[id number]. Topic: Referral - Status >> Jul 22, 2024 10:16 AM Corin V wrote: Reason for CRM: [facility] calling to advise that they were unable to speak with the patient to schedule due to patient speaking Spanish and not having a Spanish speaking staff member available to translate. She is requesting that we reach out to patient to relay the appointment information to ensure she receives it. 10/15/24 at 9:00 arrival. She needs to bring her medications and insurance card to the appointment.

## 2024-07-23 ENCOUNTER — Other Ambulatory Visit: Payer: Self-pay

## 2024-07-23 NOTE — Telephone Encounter (Signed)
 Called but no answer. LVM to call back.

## 2024-07-24 NOTE — Telephone Encounter (Signed)
 FYI. Third attempt. Called but no answer. LVM to call back.

## 2024-08-12 ENCOUNTER — Ambulatory Visit: Admitting: Pharmacist

## 2024-08-27 ENCOUNTER — Telehealth: Payer: Self-pay | Admitting: Internal Medicine

## 2024-08-27 NOTE — Telephone Encounter (Signed)
 Confirmed appt for 11/13

## 2024-08-28 ENCOUNTER — Ambulatory Visit: Admitting: Pharmacist

## 2024-09-15 ENCOUNTER — Other Ambulatory Visit: Payer: Self-pay

## 2024-10-17 ENCOUNTER — Ambulatory Visit: Payer: Self-pay | Admitting: Internal Medicine

## 2024-10-27 ENCOUNTER — Other Ambulatory Visit: Payer: Self-pay
